# Patient Record
Sex: Female | Born: 1945
Health system: Southern US, Community
[De-identification: ages and names within clinical notes are randomized; demographics above are authoritative.]

## PROBLEM LIST (undated history)

## (undated) ENCOUNTER — Ambulatory Visit: Source: Home / Self Care

## (undated) DIAGNOSIS — E785 Hyperlipidemia, unspecified: Secondary | ICD-10-CM

## (undated) DIAGNOSIS — M1990 Unspecified osteoarthritis, unspecified site: Secondary | ICD-10-CM

## (undated) DIAGNOSIS — K2970 Gastritis, unspecified, without bleeding: Secondary | ICD-10-CM

## (undated) DIAGNOSIS — K219 Gastro-esophageal reflux disease without esophagitis: Secondary | ICD-10-CM

## (undated) DIAGNOSIS — K859 Acute pancreatitis without necrosis or infection, unspecified: Secondary | ICD-10-CM

## (undated) DIAGNOSIS — I10 Essential (primary) hypertension: Secondary | ICD-10-CM

## (undated) HISTORY — DX: Hyperlipidemia, unspecified: E78.5

## (undated) HISTORY — PX: CATARACT EXTRACTION, BILATERAL: SHX1313

## (undated) HISTORY — PX: OTHER SURGICAL HISTORY: SHX169

## (undated) HISTORY — DX: Essential (primary) hypertension: I10

## (undated) HISTORY — DX: Gastritis, unspecified, without bleeding: K29.70

## (undated) HISTORY — PX: RHINOPLASTY: SUR1284

## (undated) HISTORY — DX: Acute pancreatitis without necrosis or infection, unspecified: K85.90

---

## 1998-01-17 ENCOUNTER — Other Ambulatory Visit: Admission: RE | Admit: 1998-01-17 | Discharge: 1998-01-17 | Payer: Self-pay | Admitting: Obstetrics and Gynecology

## 1998-06-25 HISTORY — PX: BREAST BIOPSY: SHX20

## 1999-02-13 ENCOUNTER — Other Ambulatory Visit: Admission: RE | Admit: 1999-02-13 | Discharge: 1999-02-13 | Payer: Self-pay | Admitting: Obstetrics and Gynecology

## 1999-03-09 ENCOUNTER — Ambulatory Visit (HOSPITAL_COMMUNITY): Admission: RE | Admit: 1999-03-09 | Discharge: 1999-03-09 | Payer: Self-pay | Admitting: Surgery

## 1999-03-09 ENCOUNTER — Encounter (INDEPENDENT_AMBULATORY_CARE_PROVIDER_SITE_OTHER): Payer: Self-pay

## 1999-03-09 ENCOUNTER — Encounter: Payer: Self-pay | Admitting: Surgery

## 2000-01-23 ENCOUNTER — Other Ambulatory Visit: Admission: RE | Admit: 2000-01-23 | Discharge: 2000-01-23 | Payer: Self-pay | Admitting: Obstetrics and Gynecology

## 2000-03-08 ENCOUNTER — Encounter: Payer: Self-pay | Admitting: Obstetrics and Gynecology

## 2000-03-08 ENCOUNTER — Ambulatory Visit (HOSPITAL_COMMUNITY): Admission: RE | Admit: 2000-03-08 | Discharge: 2000-03-08 | Payer: Self-pay | Admitting: Obstetrics and Gynecology

## 2001-03-07 ENCOUNTER — Ambulatory Visit (HOSPITAL_COMMUNITY): Admission: RE | Admit: 2001-03-07 | Discharge: 2001-03-07 | Payer: Self-pay | Admitting: Obstetrics and Gynecology

## 2001-03-07 ENCOUNTER — Encounter: Payer: Self-pay | Admitting: Obstetrics and Gynecology

## 2001-03-11 ENCOUNTER — Other Ambulatory Visit: Admission: RE | Admit: 2001-03-11 | Discharge: 2001-03-11 | Payer: Self-pay | Admitting: Obstetrics and Gynecology

## 2002-03-10 ENCOUNTER — Ambulatory Visit (HOSPITAL_COMMUNITY): Admission: RE | Admit: 2002-03-10 | Discharge: 2002-03-10 | Payer: Self-pay | Admitting: Obstetrics and Gynecology

## 2002-03-10 ENCOUNTER — Encounter: Payer: Self-pay | Admitting: Obstetrics and Gynecology

## 2002-03-18 ENCOUNTER — Other Ambulatory Visit: Admission: RE | Admit: 2002-03-18 | Discharge: 2002-03-18 | Payer: Self-pay | Admitting: Obstetrics and Gynecology

## 2003-01-26 ENCOUNTER — Encounter: Admission: RE | Admit: 2003-01-26 | Discharge: 2003-01-26 | Payer: Self-pay | Admitting: Obstetrics and Gynecology

## 2003-01-26 ENCOUNTER — Encounter: Payer: Self-pay | Admitting: Obstetrics and Gynecology

## 2003-04-22 ENCOUNTER — Other Ambulatory Visit: Admission: RE | Admit: 2003-04-22 | Discharge: 2003-04-22 | Payer: Self-pay | Admitting: Obstetrics and Gynecology

## 2004-01-27 ENCOUNTER — Encounter: Admission: RE | Admit: 2004-01-27 | Discharge: 2004-01-27 | Payer: Self-pay | Admitting: Obstetrics and Gynecology

## 2004-04-25 ENCOUNTER — Other Ambulatory Visit: Admission: RE | Admit: 2004-04-25 | Discharge: 2004-04-25 | Payer: Self-pay | Admitting: Obstetrics and Gynecology

## 2005-01-31 ENCOUNTER — Encounter: Admission: RE | Admit: 2005-01-31 | Discharge: 2005-01-31 | Payer: Self-pay | Admitting: Obstetrics and Gynecology

## 2005-05-15 ENCOUNTER — Encounter: Admission: RE | Admit: 2005-05-15 | Discharge: 2005-05-15 | Payer: Self-pay | Admitting: Obstetrics and Gynecology

## 2005-07-25 ENCOUNTER — Encounter: Admission: RE | Admit: 2005-07-25 | Discharge: 2005-07-25 | Payer: Self-pay | Admitting: Orthopedic Surgery

## 2006-03-14 ENCOUNTER — Encounter: Admission: RE | Admit: 2006-03-14 | Discharge: 2006-03-14 | Payer: Self-pay | Admitting: Obstetrics and Gynecology

## 2007-05-16 ENCOUNTER — Encounter: Admission: RE | Admit: 2007-05-16 | Discharge: 2007-05-16 | Payer: Self-pay | Admitting: Obstetrics and Gynecology

## 2008-05-17 ENCOUNTER — Encounter: Admission: RE | Admit: 2008-05-17 | Discharge: 2008-05-17 | Payer: Self-pay | Admitting: Obstetrics and Gynecology

## 2009-07-12 ENCOUNTER — Encounter: Admission: RE | Admit: 2009-07-12 | Discharge: 2009-07-12 | Payer: Self-pay | Admitting: Obstetrics and Gynecology

## 2010-07-14 ENCOUNTER — Encounter
Admission: RE | Admit: 2010-07-14 | Discharge: 2010-07-14 | Payer: Self-pay | Source: Home / Self Care | Attending: Obstetrics and Gynecology | Admitting: Obstetrics and Gynecology

## 2011-06-29 ENCOUNTER — Other Ambulatory Visit: Payer: Self-pay | Admitting: Obstetrics and Gynecology

## 2011-06-29 DIAGNOSIS — Z1231 Encounter for screening mammogram for malignant neoplasm of breast: Secondary | ICD-10-CM

## 2011-07-17 ENCOUNTER — Ambulatory Visit
Admission: RE | Admit: 2011-07-17 | Discharge: 2011-07-17 | Disposition: A | Discharge status: Home / Self Care | Payer: BLUE CROSS/BLUE SHIELD | Source: Ambulatory Visit | Attending: Obstetrics and Gynecology | Admitting: Obstetrics and Gynecology

## 2011-07-17 DIAGNOSIS — Z1231 Encounter for screening mammogram for malignant neoplasm of breast: Secondary | ICD-10-CM

## 2012-02-11 DIAGNOSIS — E785 Hyperlipidemia, unspecified: Secondary | ICD-10-CM | POA: Insufficient documentation

## 2012-04-01 DIAGNOSIS — M8000XD Age-related osteoporosis with current pathological fracture, unspecified site, subsequent encounter for fracture with routine healing: Secondary | ICD-10-CM | POA: Insufficient documentation

## 2012-04-01 DIAGNOSIS — M810 Age-related osteoporosis without current pathological fracture: Secondary | ICD-10-CM | POA: Insufficient documentation

## 2012-04-15 ENCOUNTER — Encounter (HOSPITAL_COMMUNITY): Payer: Self-pay

## 2012-04-15 ENCOUNTER — Encounter (HOSPITAL_COMMUNITY)
Admission: RE | Admit: 2012-04-15 | Discharge: 2012-04-15 | Disposition: A | Discharge status: Home / Self Care | Payer: Medicare Other | Source: Ambulatory Visit | Attending: Internal Medicine | Admitting: Internal Medicine

## 2012-04-15 DIAGNOSIS — M810 Age-related osteoporosis without current pathological fracture: Secondary | ICD-10-CM | POA: Insufficient documentation

## 2012-04-15 HISTORY — DX: Gastro-esophageal reflux disease without esophagitis: K21.9

## 2012-04-15 HISTORY — DX: Unspecified osteoarthritis, unspecified site: M19.90

## 2012-04-15 MED ORDER — ZOLEDRONIC ACID 5 MG/100ML IV SOLN
5.0000 mg | Freq: Once | INTRAVENOUS | Status: AC
  Administered 2012-04-15: 5 mg via INTRAVENOUS
  Filled 2012-04-15: qty 100

## 2012-04-15 MED ORDER — SODIUM CHLORIDE 0.9 % IV SOLN
Freq: Once | INTRAVENOUS | Status: AC
  Administered 2012-04-15: 15:00:00 via INTRAVENOUS

## 2012-04-15 NOTE — Progress Notes (Signed)
Pt here for reclast infusion . States she"cannot take oral calcium pills because it caused calcium deposits in breast but she is able to take milk, yogurt,cheese and almonds" Calcium blood level is 9.5

## 2012-06-11 ENCOUNTER — Other Ambulatory Visit: Payer: Self-pay | Admitting: Obstetrics and Gynecology

## 2012-06-11 DIAGNOSIS — Z1231 Encounter for screening mammogram for malignant neoplasm of breast: Secondary | ICD-10-CM

## 2012-07-18 ENCOUNTER — Ambulatory Visit
Admission: RE | Admit: 2012-07-18 | Discharge: 2012-07-18 | Disposition: A | Discharge status: Home / Self Care | Payer: Medicare Other | Source: Ambulatory Visit | Attending: Obstetrics and Gynecology | Admitting: Obstetrics and Gynecology

## 2012-07-18 DIAGNOSIS — Z1231 Encounter for screening mammogram for malignant neoplasm of breast: Secondary | ICD-10-CM

## 2012-07-21 ENCOUNTER — Other Ambulatory Visit: Payer: Self-pay | Admitting: Obstetrics and Gynecology

## 2012-07-21 DIAGNOSIS — R928 Other abnormal and inconclusive findings on diagnostic imaging of breast: Secondary | ICD-10-CM

## 2012-07-30 ENCOUNTER — Ambulatory Visit
Admission: RE | Admit: 2012-07-30 | Discharge: 2012-07-30 | Disposition: A | Discharge status: Home / Self Care | Payer: Medicare Other | Source: Ambulatory Visit | Attending: Obstetrics and Gynecology | Admitting: Obstetrics and Gynecology

## 2012-07-30 DIAGNOSIS — R928 Other abnormal and inconclusive findings on diagnostic imaging of breast: Secondary | ICD-10-CM

## 2013-03-25 DIAGNOSIS — R32 Unspecified urinary incontinence: Secondary | ICD-10-CM | POA: Insufficient documentation

## 2013-03-25 DIAGNOSIS — R945 Abnormal results of liver function studies: Secondary | ICD-10-CM | POA: Insufficient documentation

## 2013-04-16 ENCOUNTER — Encounter (HOSPITAL_COMMUNITY)
Admission: RE | Admit: 2013-04-16 | Discharge: 2013-04-16 | Disposition: A | Discharge status: Home / Self Care | Payer: Medicare Other | Source: Ambulatory Visit | Attending: Internal Medicine | Admitting: Internal Medicine

## 2013-04-16 DIAGNOSIS — M810 Age-related osteoporosis without current pathological fracture: Secondary | ICD-10-CM | POA: Insufficient documentation

## 2013-04-16 MED ORDER — SODIUM CHLORIDE 0.9 % IV SOLN
INTRAVENOUS | Status: DC
  Administered 2013-04-16: 250 mL via INTRAVENOUS

## 2013-04-16 MED ORDER — ZOLEDRONIC ACID 5 MG/100ML IV SOLN
5.0000 mg | Freq: Once | INTRAVENOUS | Status: AC
  Administered 2013-04-16: 5 mg via INTRAVENOUS
  Filled 2013-04-16: qty 100

## 2013-04-16 NOTE — Progress Notes (Signed)
Pt here for RECLAST infusion and lab work is within criteria for infusion. Pt states that she does not take calcium supplements but does take dietary calcium. Any further questions about this she will discuss with Dr Waynard Edwards

## 2013-07-03 ENCOUNTER — Other Ambulatory Visit: Payer: Self-pay

## 2013-07-03 DIAGNOSIS — Z1231 Encounter for screening mammogram for malignant neoplasm of breast: Secondary | ICD-10-CM

## 2013-07-27 ENCOUNTER — Ambulatory Visit
Admission: RE | Admit: 2013-07-27 | Discharge: 2013-07-27 | Disposition: A | Discharge status: Home / Self Care | Payer: Medicare Other | Source: Ambulatory Visit

## 2013-07-27 DIAGNOSIS — Z1231 Encounter for screening mammogram for malignant neoplasm of breast: Secondary | ICD-10-CM

## 2014-04-16 DIAGNOSIS — M4120 Other idiopathic scoliosis, site unspecified: Secondary | ICD-10-CM | POA: Insufficient documentation

## 2014-04-28 ENCOUNTER — Other Ambulatory Visit (HOSPITAL_COMMUNITY): Payer: Self-pay | Admitting: Internal Medicine

## 2014-05-05 ENCOUNTER — Encounter (HOSPITAL_COMMUNITY): Payer: Self-pay

## 2014-05-05 ENCOUNTER — Ambulatory Visit (HOSPITAL_COMMUNITY)
Admission: RE | Admit: 2014-05-05 | Discharge: 2014-05-05 | Disposition: A | Discharge status: Home / Self Care | Payer: Medicare Other | Source: Ambulatory Visit | Attending: Internal Medicine | Admitting: Internal Medicine

## 2014-05-05 ENCOUNTER — Other Ambulatory Visit (HOSPITAL_COMMUNITY): Payer: Self-pay | Admitting: Internal Medicine

## 2014-05-05 DIAGNOSIS — M810 Age-related osteoporosis without current pathological fracture: Secondary | ICD-10-CM | POA: Insufficient documentation

## 2014-05-05 MED ORDER — SODIUM CHLORIDE 0.9 % IV SOLN
INTRAVENOUS | Status: DC
  Administered 2014-05-05: 14:00:00 via INTRAVENOUS

## 2014-05-05 MED ORDER — ZOLEDRONIC ACID 5 MG/100ML IV SOLN
5.0000 mg | Freq: Once | INTRAVENOUS | Status: AC
  Administered 2014-05-05: 5 mg via INTRAVENOUS
  Filled 2014-05-05: qty 100

## 2014-05-05 NOTE — Discharge Instructions (Signed)

## 2014-07-02 ENCOUNTER — Other Ambulatory Visit: Payer: Self-pay

## 2014-07-02 DIAGNOSIS — Z1231 Encounter for screening mammogram for malignant neoplasm of breast: Secondary | ICD-10-CM

## 2014-08-03 ENCOUNTER — Ambulatory Visit
Admission: RE | Admit: 2014-08-03 | Discharge: 2014-08-03 | Disposition: A | Discharge status: Home / Self Care | Payer: Medicare Other | Source: Ambulatory Visit

## 2014-08-03 DIAGNOSIS — Z1231 Encounter for screening mammogram for malignant neoplasm of breast: Secondary | ICD-10-CM

## 2015-03-21 DIAGNOSIS — I8310 Varicose veins of unspecified lower extremity with inflammation: Secondary | ICD-10-CM | POA: Insufficient documentation

## 2015-04-15 DIAGNOSIS — Z0000 Encounter for general adult medical examination without abnormal findings: Secondary | ICD-10-CM | POA: Insufficient documentation

## 2015-04-28 DIAGNOSIS — Z1389 Encounter for screening for other disorder: Secondary | ICD-10-CM | POA: Insufficient documentation

## 2015-05-11 ENCOUNTER — Ambulatory Visit (HOSPITAL_COMMUNITY)
Admission: RE | Admit: 2015-05-11 | Discharge: 2015-05-11 | Disposition: A | Discharge status: Home / Self Care | Payer: Medicare Other | Source: Ambulatory Visit | Attending: Internal Medicine | Admitting: Internal Medicine

## 2015-05-11 ENCOUNTER — Other Ambulatory Visit (HOSPITAL_COMMUNITY): Payer: Self-pay | Admitting: Internal Medicine

## 2015-05-11 ENCOUNTER — Encounter (HOSPITAL_COMMUNITY): Payer: Self-pay

## 2015-05-11 DIAGNOSIS — M810 Age-related osteoporosis without current pathological fracture: Secondary | ICD-10-CM | POA: Diagnosis present

## 2015-05-11 DIAGNOSIS — N959 Unspecified menopausal and perimenopausal disorder: Secondary | ICD-10-CM | POA: Insufficient documentation

## 2015-05-11 MED ORDER — ZOLEDRONIC ACID 5 MG/100ML IV SOLN
5.0000 mg | Freq: Once | INTRAVENOUS | Status: AC
  Administered 2015-05-11: 5 mg via INTRAVENOUS
  Filled 2015-05-11: qty 100

## 2015-05-11 MED ORDER — SODIUM CHLORIDE 0.9 % IV SOLN
INTRAVENOUS | Status: DC
  Administered 2015-05-11: 16:00:00 via INTRAVENOUS

## 2015-05-11 NOTE — Discharge Instructions (Signed)

## 2015-07-07 ENCOUNTER — Other Ambulatory Visit: Payer: Self-pay

## 2015-07-07 DIAGNOSIS — Z1231 Encounter for screening mammogram for malignant neoplasm of breast: Secondary | ICD-10-CM

## 2015-07-27 DIAGNOSIS — M791 Myalgia: Secondary | ICD-10-CM | POA: Diagnosis not present

## 2015-07-27 DIAGNOSIS — R3129 Other microscopic hematuria: Secondary | ICD-10-CM | POA: Diagnosis not present

## 2015-07-27 DIAGNOSIS — Z1211 Encounter for screening for malignant neoplasm of colon: Secondary | ICD-10-CM | POA: Diagnosis not present

## 2015-07-27 DIAGNOSIS — R1032 Left lower quadrant pain: Secondary | ICD-10-CM | POA: Diagnosis not present

## 2015-07-27 DIAGNOSIS — Z6822 Body mass index (BMI) 22.0-22.9, adult: Secondary | ICD-10-CM | POA: Diagnosis not present

## 2015-07-27 DIAGNOSIS — R030 Elevated blood-pressure reading, without diagnosis of hypertension: Secondary | ICD-10-CM | POA: Diagnosis not present

## 2015-08-01 ENCOUNTER — Encounter: Payer: Self-pay | Admitting: Internal Medicine

## 2015-08-04 DIAGNOSIS — R1013 Epigastric pain: Secondary | ICD-10-CM | POA: Diagnosis not present

## 2015-08-04 DIAGNOSIS — L270 Generalized skin eruption due to drugs and medicaments taken internally: Secondary | ICD-10-CM | POA: Diagnosis not present

## 2015-08-04 DIAGNOSIS — R1032 Left lower quadrant pain: Secondary | ICD-10-CM | POA: Diagnosis not present

## 2015-08-04 DIAGNOSIS — R945 Abnormal results of liver function studies: Secondary | ICD-10-CM | POA: Diagnosis not present

## 2015-08-04 DIAGNOSIS — M2550 Pain in unspecified joint: Secondary | ICD-10-CM | POA: Diagnosis not present

## 2015-08-04 DIAGNOSIS — Z6822 Body mass index (BMI) 22.0-22.9, adult: Secondary | ICD-10-CM | POA: Diagnosis not present

## 2015-08-04 DIAGNOSIS — M791 Myalgia: Secondary | ICD-10-CM | POA: Diagnosis not present

## 2015-08-08 ENCOUNTER — Ambulatory Visit: Payer: Medicare Other

## 2015-08-08 ENCOUNTER — Ambulatory Visit
Admission: RE | Admit: 2015-08-08 | Discharge: 2015-08-08 | Disposition: A | Discharge status: Home / Self Care | Payer: Medicare Other | Source: Ambulatory Visit

## 2015-08-08 DIAGNOSIS — Z1231 Encounter for screening mammogram for malignant neoplasm of breast: Secondary | ICD-10-CM | POA: Diagnosis not present

## 2015-08-25 DIAGNOSIS — R768 Other specified abnormal immunological findings in serum: Secondary | ICD-10-CM | POA: Diagnosis not present

## 2015-08-25 DIAGNOSIS — M25562 Pain in left knee: Secondary | ICD-10-CM | POA: Diagnosis not present

## 2015-08-25 DIAGNOSIS — M2550 Pain in unspecified joint: Secondary | ICD-10-CM | POA: Diagnosis not present

## 2016-03-07 DIAGNOSIS — Z6822 Body mass index (BMI) 22.0-22.9, adult: Secondary | ICD-10-CM | POA: Diagnosis not present

## 2016-03-07 DIAGNOSIS — L299 Pruritus, unspecified: Secondary | ICD-10-CM | POA: Diagnosis not present

## 2016-03-07 DIAGNOSIS — W57XXXA Bitten or stung by nonvenomous insect and other nonvenomous arthropods, initial encounter: Secondary | ICD-10-CM | POA: Diagnosis not present

## 2016-03-22 DIAGNOSIS — Z85828 Personal history of other malignant neoplasm of skin: Secondary | ICD-10-CM | POA: Diagnosis not present

## 2016-03-22 DIAGNOSIS — L817 Pigmented purpuric dermatosis: Secondary | ICD-10-CM | POA: Diagnosis not present

## 2016-03-22 DIAGNOSIS — S30860A Insect bite (nonvenomous) of lower back and pelvis, initial encounter: Secondary | ICD-10-CM | POA: Diagnosis not present

## 2016-05-01 DIAGNOSIS — Z23 Encounter for immunization: Secondary | ICD-10-CM | POA: Diagnosis not present

## 2016-06-16 DIAGNOSIS — M26629 Arthralgia of temporomandibular joint, unspecified side: Secondary | ICD-10-CM | POA: Diagnosis not present

## 2016-06-16 DIAGNOSIS — R030 Elevated blood-pressure reading, without diagnosis of hypertension: Secondary | ICD-10-CM | POA: Diagnosis not present

## 2016-07-03 DIAGNOSIS — Z85828 Personal history of other malignant neoplasm of skin: Secondary | ICD-10-CM | POA: Diagnosis not present

## 2016-07-03 DIAGNOSIS — D1801 Hemangioma of skin and subcutaneous tissue: Secondary | ICD-10-CM | POA: Diagnosis not present

## 2016-07-03 DIAGNOSIS — L111 Transient acantholytic dermatosis [Grover]: Secondary | ICD-10-CM | POA: Diagnosis not present

## 2016-07-03 DIAGNOSIS — D225 Melanocytic nevi of trunk: Secondary | ICD-10-CM | POA: Diagnosis not present

## 2016-07-03 DIAGNOSIS — L821 Other seborrheic keratosis: Secondary | ICD-10-CM | POA: Diagnosis not present

## 2016-07-03 DIAGNOSIS — L2089 Other atopic dermatitis: Secondary | ICD-10-CM | POA: Diagnosis not present

## 2016-07-03 DIAGNOSIS — L817 Pigmented purpuric dermatosis: Secondary | ICD-10-CM | POA: Diagnosis not present

## 2016-07-03 DIAGNOSIS — L814 Other melanin hyperpigmentation: Secondary | ICD-10-CM | POA: Diagnosis not present

## 2016-07-05 ENCOUNTER — Other Ambulatory Visit: Payer: Self-pay | Admitting: Internal Medicine

## 2016-07-05 DIAGNOSIS — Z1231 Encounter for screening mammogram for malignant neoplasm of breast: Secondary | ICD-10-CM

## 2016-07-24 DIAGNOSIS — H524 Presbyopia: Secondary | ICD-10-CM | POA: Diagnosis not present

## 2016-08-15 DIAGNOSIS — M810 Age-related osteoporosis without current pathological fracture: Secondary | ICD-10-CM | POA: Diagnosis not present

## 2016-08-15 DIAGNOSIS — E784 Other hyperlipidemia: Secondary | ICD-10-CM | POA: Diagnosis not present

## 2016-08-17 ENCOUNTER — Ambulatory Visit
Admission: RE | Admit: 2016-08-17 | Discharge: 2016-08-17 | Disposition: A | Discharge status: Home / Self Care | Payer: Medicare Other | Source: Ambulatory Visit | Attending: Internal Medicine | Admitting: Internal Medicine

## 2016-08-17 DIAGNOSIS — Z1231 Encounter for screening mammogram for malignant neoplasm of breast: Secondary | ICD-10-CM | POA: Diagnosis not present

## 2016-08-21 ENCOUNTER — Other Ambulatory Visit: Payer: Self-pay | Admitting: Internal Medicine

## 2016-08-21 DIAGNOSIS — R928 Other abnormal and inconclusive findings on diagnostic imaging of breast: Secondary | ICD-10-CM

## 2016-08-22 DIAGNOSIS — R030 Elevated blood-pressure reading, without diagnosis of hypertension: Secondary | ICD-10-CM | POA: Diagnosis not present

## 2016-08-22 DIAGNOSIS — M810 Age-related osteoporosis without current pathological fracture: Secondary | ICD-10-CM | POA: Diagnosis not present

## 2016-08-22 DIAGNOSIS — Z0000 Encounter for general adult medical examination without abnormal findings: Secondary | ICD-10-CM | POA: Diagnosis not present

## 2016-08-22 DIAGNOSIS — Z1212 Encounter for screening for malignant neoplasm of rectum: Secondary | ICD-10-CM | POA: Diagnosis not present

## 2016-08-22 DIAGNOSIS — Z23 Encounter for immunization: Secondary | ICD-10-CM | POA: Diagnosis not present

## 2016-08-22 DIAGNOSIS — I8310 Varicose veins of unspecified lower extremity with inflammation: Secondary | ICD-10-CM | POA: Diagnosis not present

## 2016-08-24 ENCOUNTER — Ambulatory Visit
Admission: RE | Admit: 2016-08-24 | Discharge: 2016-08-24 | Disposition: A | Discharge status: Home / Self Care | Payer: Medicare Other | Source: Ambulatory Visit | Attending: Internal Medicine | Admitting: Internal Medicine

## 2016-08-24 DIAGNOSIS — R928 Other abnormal and inconclusive findings on diagnostic imaging of breast: Secondary | ICD-10-CM

## 2016-09-05 DIAGNOSIS — Z1211 Encounter for screening for malignant neoplasm of colon: Secondary | ICD-10-CM | POA: Diagnosis not present

## 2016-09-05 DIAGNOSIS — Z1212 Encounter for screening for malignant neoplasm of rectum: Secondary | ICD-10-CM | POA: Diagnosis not present

## 2016-10-03 DIAGNOSIS — R945 Abnormal results of liver function studies: Secondary | ICD-10-CM | POA: Diagnosis not present

## 2016-12-20 DIAGNOSIS — R945 Abnormal results of liver function studies: Secondary | ICD-10-CM | POA: Diagnosis not present

## 2017-01-21 ENCOUNTER — Other Ambulatory Visit: Payer: Self-pay | Admitting: Internal Medicine

## 2017-01-21 DIAGNOSIS — R921 Mammographic calcification found on diagnostic imaging of breast: Secondary | ICD-10-CM

## 2017-03-01 ENCOUNTER — Ambulatory Visit
Admission: RE | Admit: 2017-03-01 | Discharge: 2017-03-01 | Disposition: A | Discharge status: Home / Self Care | Payer: Medicare Other | Source: Ambulatory Visit | Attending: Internal Medicine | Admitting: Internal Medicine

## 2017-03-01 DIAGNOSIS — R928 Other abnormal and inconclusive findings on diagnostic imaging of breast: Secondary | ICD-10-CM | POA: Diagnosis not present

## 2017-03-01 DIAGNOSIS — R921 Mammographic calcification found on diagnostic imaging of breast: Secondary | ICD-10-CM

## 2017-03-04 DIAGNOSIS — M810 Age-related osteoporosis without current pathological fracture: Secondary | ICD-10-CM | POA: Diagnosis not present

## 2017-03-05 ENCOUNTER — Other Ambulatory Visit: Payer: Self-pay | Admitting: Internal Medicine

## 2017-03-05 DIAGNOSIS — R921 Mammographic calcification found on diagnostic imaging of breast: Secondary | ICD-10-CM

## 2017-03-12 DIAGNOSIS — R945 Abnormal results of liver function studies: Secondary | ICD-10-CM | POA: Diagnosis not present

## 2017-04-02 DIAGNOSIS — Z6821 Body mass index (BMI) 21.0-21.9, adult: Secondary | ICD-10-CM | POA: Diagnosis not present

## 2017-04-02 DIAGNOSIS — S80921A Unspecified superficial injury of right lower leg, initial encounter: Secondary | ICD-10-CM | POA: Diagnosis not present

## 2017-04-02 DIAGNOSIS — L0889 Other specified local infections of the skin and subcutaneous tissue: Secondary | ICD-10-CM | POA: Diagnosis not present

## 2017-04-25 DIAGNOSIS — Z23 Encounter for immunization: Secondary | ICD-10-CM | POA: Diagnosis not present

## 2017-07-01 DIAGNOSIS — L814 Other melanin hyperpigmentation: Secondary | ICD-10-CM | POA: Diagnosis not present

## 2017-07-01 DIAGNOSIS — L821 Other seborrheic keratosis: Secondary | ICD-10-CM | POA: Diagnosis not present

## 2017-07-01 DIAGNOSIS — L570 Actinic keratosis: Secondary | ICD-10-CM | POA: Diagnosis not present

## 2017-07-01 DIAGNOSIS — Z85828 Personal history of other malignant neoplasm of skin: Secondary | ICD-10-CM | POA: Diagnosis not present

## 2017-07-01 DIAGNOSIS — C44519 Basal cell carcinoma of skin of other part of trunk: Secondary | ICD-10-CM | POA: Diagnosis not present

## 2017-07-29 DIAGNOSIS — H524 Presbyopia: Secondary | ICD-10-CM | POA: Diagnosis not present

## 2017-08-19 ENCOUNTER — Ambulatory Visit
Admission: RE | Admit: 2017-08-19 | Discharge: 2017-08-19 | Disposition: A | Discharge status: Home / Self Care | Payer: Medicare Other | Source: Ambulatory Visit | Attending: Internal Medicine | Admitting: Internal Medicine

## 2017-08-19 DIAGNOSIS — R921 Mammographic calcification found on diagnostic imaging of breast: Secondary | ICD-10-CM

## 2017-08-19 DIAGNOSIS — M545 Low back pain: Secondary | ICD-10-CM | POA: Diagnosis not present

## 2017-08-19 DIAGNOSIS — M25551 Pain in right hip: Secondary | ICD-10-CM | POA: Diagnosis not present

## 2017-08-19 DIAGNOSIS — M25552 Pain in left hip: Secondary | ICD-10-CM | POA: Diagnosis not present

## 2017-09-16 DIAGNOSIS — M25551 Pain in right hip: Secondary | ICD-10-CM | POA: Diagnosis not present

## 2017-09-16 DIAGNOSIS — M25552 Pain in left hip: Secondary | ICD-10-CM | POA: Diagnosis not present

## 2017-10-02 DIAGNOSIS — I10 Essential (primary) hypertension: Secondary | ICD-10-CM | POA: Diagnosis not present

## 2017-10-02 DIAGNOSIS — R82998 Other abnormal findings in urine: Secondary | ICD-10-CM | POA: Diagnosis not present

## 2017-10-02 DIAGNOSIS — M810 Age-related osteoporosis without current pathological fracture: Secondary | ICD-10-CM | POA: Diagnosis not present

## 2017-10-02 DIAGNOSIS — E785 Hyperlipidemia, unspecified: Secondary | ICD-10-CM | POA: Diagnosis not present

## 2017-10-07 DIAGNOSIS — R921 Mammographic calcification found on diagnostic imaging of breast: Secondary | ICD-10-CM | POA: Diagnosis not present

## 2017-10-07 DIAGNOSIS — L0889 Other specified local infections of the skin and subcutaneous tissue: Secondary | ICD-10-CM | POA: Diagnosis not present

## 2017-10-07 DIAGNOSIS — Z0000 Encounter for general adult medical examination without abnormal findings: Secondary | ICD-10-CM | POA: Diagnosis not present

## 2017-10-07 DIAGNOSIS — M7150 Other bursitis, not elsewhere classified, unspecified site: Secondary | ICD-10-CM | POA: Diagnosis not present

## 2018-01-02 DIAGNOSIS — L821 Other seborrheic keratosis: Secondary | ICD-10-CM | POA: Diagnosis not present

## 2018-01-02 DIAGNOSIS — L910 Hypertrophic scar: Secondary | ICD-10-CM | POA: Diagnosis not present

## 2018-01-02 DIAGNOSIS — Z85828 Personal history of other malignant neoplasm of skin: Secondary | ICD-10-CM | POA: Diagnosis not present

## 2018-01-02 DIAGNOSIS — L820 Inflamed seborrheic keratosis: Secondary | ICD-10-CM | POA: Diagnosis not present

## 2018-01-02 DIAGNOSIS — D225 Melanocytic nevi of trunk: Secondary | ICD-10-CM | POA: Diagnosis not present

## 2018-01-02 DIAGNOSIS — L570 Actinic keratosis: Secondary | ICD-10-CM | POA: Diagnosis not present

## 2018-01-13 DIAGNOSIS — Z6823 Body mass index (BMI) 23.0-23.9, adult: Secondary | ICD-10-CM | POA: Diagnosis not present

## 2018-01-13 DIAGNOSIS — L0889 Other specified local infections of the skin and subcutaneous tissue: Secondary | ICD-10-CM | POA: Diagnosis not present

## 2018-01-13 DIAGNOSIS — I10 Essential (primary) hypertension: Secondary | ICD-10-CM | POA: Diagnosis not present

## 2018-03-29 DIAGNOSIS — Z23 Encounter for immunization: Secondary | ICD-10-CM | POA: Diagnosis not present

## 2018-06-16 ENCOUNTER — Other Ambulatory Visit: Payer: Self-pay | Admitting: Internal Medicine

## 2018-06-16 DIAGNOSIS — K219 Gastro-esophageal reflux disease without esophagitis: Secondary | ICD-10-CM | POA: Diagnosis not present

## 2018-06-16 DIAGNOSIS — Z6823 Body mass index (BMI) 23.0-23.9, adult: Secondary | ICD-10-CM | POA: Diagnosis not present

## 2018-06-16 DIAGNOSIS — R1013 Epigastric pain: Secondary | ICD-10-CM | POA: Diagnosis not present

## 2018-06-20 DIAGNOSIS — R1011 Right upper quadrant pain: Secondary | ICD-10-CM | POA: Diagnosis not present

## 2018-06-20 DIAGNOSIS — K859 Acute pancreatitis without necrosis or infection, unspecified: Secondary | ICD-10-CM | POA: Insufficient documentation

## 2018-07-03 DIAGNOSIS — Z6823 Body mass index (BMI) 23.0-23.9, adult: Secondary | ICD-10-CM | POA: Diagnosis not present

## 2018-07-03 DIAGNOSIS — R1013 Epigastric pain: Secondary | ICD-10-CM | POA: Diagnosis not present

## 2018-07-03 DIAGNOSIS — K859 Acute pancreatitis without necrosis or infection, unspecified: Secondary | ICD-10-CM | POA: Diagnosis not present

## 2018-07-07 DIAGNOSIS — Z6823 Body mass index (BMI) 23.0-23.9, adult: Secondary | ICD-10-CM | POA: Diagnosis not present

## 2018-07-07 DIAGNOSIS — K219 Gastro-esophageal reflux disease without esophagitis: Secondary | ICD-10-CM | POA: Diagnosis not present

## 2018-07-07 DIAGNOSIS — K859 Acute pancreatitis without necrosis or infection, unspecified: Secondary | ICD-10-CM | POA: Diagnosis not present

## 2018-07-07 DIAGNOSIS — K921 Melena: Secondary | ICD-10-CM | POA: Diagnosis not present

## 2018-07-11 ENCOUNTER — Other Ambulatory Visit: Payer: Self-pay | Admitting: Internal Medicine

## 2018-07-11 DIAGNOSIS — K921 Melena: Secondary | ICD-10-CM | POA: Diagnosis not present

## 2018-07-11 DIAGNOSIS — R1013 Epigastric pain: Secondary | ICD-10-CM | POA: Diagnosis not present

## 2018-07-11 DIAGNOSIS — Z1231 Encounter for screening mammogram for malignant neoplasm of breast: Secondary | ICD-10-CM

## 2018-07-15 DIAGNOSIS — R1013 Epigastric pain: Secondary | ICD-10-CM | POA: Diagnosis not present

## 2018-07-15 DIAGNOSIS — K2930 Chronic superficial gastritis without bleeding: Secondary | ICD-10-CM | POA: Diagnosis not present

## 2018-07-15 DIAGNOSIS — K921 Melena: Secondary | ICD-10-CM | POA: Diagnosis not present

## 2018-07-23 DIAGNOSIS — K2930 Chronic superficial gastritis without bleeding: Secondary | ICD-10-CM | POA: Diagnosis not present

## 2018-07-23 DIAGNOSIS — K921 Melena: Secondary | ICD-10-CM | POA: Diagnosis not present

## 2018-07-30 DIAGNOSIS — R1013 Epigastric pain: Secondary | ICD-10-CM | POA: Diagnosis not present

## 2018-07-30 DIAGNOSIS — R945 Abnormal results of liver function studies: Secondary | ICD-10-CM | POA: Diagnosis not present

## 2018-07-30 DIAGNOSIS — K2950 Unspecified chronic gastritis without bleeding: Secondary | ICD-10-CM | POA: Diagnosis not present

## 2018-08-11 DIAGNOSIS — H524 Presbyopia: Secondary | ICD-10-CM | POA: Diagnosis not present

## 2018-08-26 ENCOUNTER — Ambulatory Visit
Admission: RE | Admit: 2018-08-26 | Discharge: 2018-08-26 | Disposition: A | Discharge status: Home / Self Care | Payer: Medicare Other | Source: Ambulatory Visit | Attending: Internal Medicine | Admitting: Internal Medicine

## 2018-08-26 DIAGNOSIS — Z1231 Encounter for screening mammogram for malignant neoplasm of breast: Secondary | ICD-10-CM

## 2018-08-27 DIAGNOSIS — Z1211 Encounter for screening for malignant neoplasm of colon: Secondary | ICD-10-CM | POA: Diagnosis not present

## 2018-08-27 DIAGNOSIS — K2950 Unspecified chronic gastritis without bleeding: Secondary | ICD-10-CM | POA: Diagnosis not present

## 2018-08-27 DIAGNOSIS — R945 Abnormal results of liver function studies: Secondary | ICD-10-CM | POA: Diagnosis not present

## 2018-08-27 DIAGNOSIS — K219 Gastro-esophageal reflux disease without esophagitis: Secondary | ICD-10-CM | POA: Diagnosis not present

## 2018-10-30 ENCOUNTER — Other Ambulatory Visit: Payer: Self-pay | Admitting: Gastroenterology

## 2018-10-30 DIAGNOSIS — R945 Abnormal results of liver function studies: Secondary | ICD-10-CM

## 2018-10-30 DIAGNOSIS — R1013 Epigastric pain: Secondary | ICD-10-CM

## 2018-10-30 DIAGNOSIS — K219 Gastro-esophageal reflux disease without esophagitis: Secondary | ICD-10-CM | POA: Diagnosis not present

## 2018-10-30 DIAGNOSIS — K2950 Unspecified chronic gastritis without bleeding: Secondary | ICD-10-CM

## 2018-10-30 DIAGNOSIS — R7989 Other specified abnormal findings of blood chemistry: Secondary | ICD-10-CM

## 2018-11-03 DIAGNOSIS — I10 Essential (primary) hypertension: Secondary | ICD-10-CM | POA: Diagnosis not present

## 2018-11-03 DIAGNOSIS — M810 Age-related osteoporosis without current pathological fracture: Secondary | ICD-10-CM | POA: Diagnosis not present

## 2018-11-03 DIAGNOSIS — E7849 Other hyperlipidemia: Secondary | ICD-10-CM | POA: Diagnosis not present

## 2018-11-06 DIAGNOSIS — I10 Essential (primary) hypertension: Secondary | ICD-10-CM | POA: Diagnosis not present

## 2018-11-06 DIAGNOSIS — R82998 Other abnormal findings in urine: Secondary | ICD-10-CM | POA: Diagnosis not present

## 2018-11-10 DIAGNOSIS — R921 Mammographic calcification found on diagnostic imaging of breast: Secondary | ICD-10-CM | POA: Diagnosis not present

## 2018-11-10 DIAGNOSIS — R1013 Epigastric pain: Secondary | ICD-10-CM | POA: Diagnosis not present

## 2018-11-10 DIAGNOSIS — Z0000 Encounter for general adult medical examination without abnormal findings: Secondary | ICD-10-CM | POA: Diagnosis not present

## 2018-11-10 DIAGNOSIS — K219 Gastro-esophageal reflux disease without esophagitis: Secondary | ICD-10-CM | POA: Diagnosis not present

## 2018-11-10 DIAGNOSIS — Z1331 Encounter for screening for depression: Secondary | ICD-10-CM | POA: Diagnosis not present

## 2018-11-13 ENCOUNTER — Other Ambulatory Visit: Payer: Self-pay

## 2018-11-13 ENCOUNTER — Ambulatory Visit
Admission: RE | Admit: 2018-11-13 | Discharge: 2018-11-13 | Disposition: A | Discharge status: Home / Self Care | Payer: Medicare Other | Source: Ambulatory Visit | Attending: Gastroenterology | Admitting: Gastroenterology

## 2018-11-13 DIAGNOSIS — K2950 Unspecified chronic gastritis without bleeding: Secondary | ICD-10-CM

## 2018-11-13 DIAGNOSIS — R1013 Epigastric pain: Secondary | ICD-10-CM | POA: Diagnosis not present

## 2018-11-13 DIAGNOSIS — R7989 Other specified abnormal findings of blood chemistry: Secondary | ICD-10-CM

## 2018-11-13 MED ORDER — IOPAMIDOL (ISOVUE-300) INJECTION 61%
100.0000 mL | Freq: Once | INTRAVENOUS | Status: AC | PRN
  Administered 2018-11-13: 100 mL via INTRAVENOUS

## 2018-11-18 NOTE — Progress Notes (Signed)
Patient referred by Crist Infante, MD for hyperlipidemia  Subjective:   Tina Erickson, female    DOB: Nov 18, 1945, 73 y.o.   MRN: [id number]   Chief Complaint  Patient presents with  . New Patient (Initial Visit)  . Hyperlipidemia  . Chest Pain    HPI  73 y.o. Caucasian female with controlled hypertension, hyperlipidemia, h/o pancreatitis, family h/o CAD.  Patient lives with her husband, for whom she is the primary caretaker. She plays [employer], where she uses cart. She walks up to a mile without any chest pain or shortness of breath. Few weeks ago, she had two episodes of chest pressure and shortness of breath while driving, lasted for 3-5 min, and resolved on their own. She has h/o stomach ulcers, and pancreatitis, but states that these episodes felt different than her stomach ulcers or pancreatitis pain. Recent CT abdomen showed possible gastritis. She reportedly also underwent EGD, results not available to me.   Patient has had hyperlipidemia for a long time. In the past, she has tried pravastatin (casued memory issues), Livalo (caused itching), Zetia (casued joint pain), and Praluent (breating issues). She is currently on no lipid lowering therapy.   On a separate note, she also complains of tingling, numbness, and cold feeling in her feet.  Past Medical History:  Diagnosis Date  . Acid reflux    just occasionally  . Arthritis    at the base of my spine     Past Surgical History:  Procedure Laterality Date  . BREAST BIOPSY Left 2000  . fractured tibia repair    . RHINOPLASTY       Social History   Socioeconomic History  . Marital status: Married    Spouse name: Not on file  . Number of children: Not on file  . Years of education: Not on file  . Highest education level: Not on file  Occupational History  . Not on file  Social Needs  . Financial resource strain: Not on file  . Food insecurity:    Worry: Not on file    Inability: Not on file  .  Transportation needs:    Medical: Not on file    Non-medical: Not on file  Tobacco Use  . Smoking status: Never Smoker  Substance and Sexual Activity  . Alcohol use: Yes    Alcohol/week: 7.0 standard drinks    Types: 7 drink(s) per week  . Drug use: No  . Sexual activity: Not on file  Lifestyle  . Physical activity:    Days per week: Not on file    Minutes per session: Not on file  . Stress: Not on file  Relationships  . Social connections:    Talks on phone: Not on file    Gets together: Not on file    Attends religious service: Not on file    Active member of club or organization: Not on file    Attends meetings of clubs or organizations: Not on file    Relationship status: Not on file  . Intimate partner violence:    Fear of current or ex partner: Not on file    Emotionally abused: Not on file    Physically abused: Not on file    Forced sexual activity: Not on file  Other Topics Concern  . Not on file  Social History Narrative  . Not on file     Family History  Problem Relation Age of Onset  . Breast cancer Sister  50     Current Outpatient Medications on File Prior to Visit  Medication Sig Dispense Refill  . aspirin 81 MG tablet Take 81 mg by mouth daily.    . cholecalciferol (VITAMIN D) 1000 UNITS tablet Take 1,000 Units by mouth daily.    Marland Kitchen co-enzyme Q-10 50 MG capsule Take 50 mg by mouth daily.    . Multiple Vitamins-Minerals (HAIR SKIN AND NAILS FORMULA) TABS Take by mouth daily.    Marland Kitchen omeprazole (PRILOSEC) 20 MG capsule Take 20 mg by mouth daily.    Marland Kitchen OVER THE COUNTER MEDICATION Take 1 capsule by mouth daily. MEGA RED     No current facility-administered medications on file prior to visit.     Cardiovascular studies:  EKG 11/19/2018: Sinus rhythm 66 bpm. Normal EKG.  CT abdomen 11/13/2018: 1. No acute findings or explanation for the patient's symptoms. 2. Limited evaluation of the stomach due to incomplete distention and absence of enteric  contrast. Possible gastric wall thickening as can be seen with gastritis. 3. Small renal cysts. 4.  Aortic Atherosclerosis (ICD10-I70.0).  Recent labs: 11/03/2018: Glucose 88. BUN/Cr 11/0.9. eGFR normal. Na/K 139/4.1. Rest of the CMP normal. H/H 11/36. MCV 94. Platelets 244. Chol 296, TG 114, HDL 64, LDL 209 Apolipoprotein B 126 (high) TSH normal  Review of Systems  Constitution: Negative for decreased appetite, malaise/fatigue, weight gain and weight loss.  HENT: Negative for congestion.   Eyes: Negative for visual disturbance.  Cardiovascular: Positive for chest pain. Negative for dyspnea on exertion, leg swelling, palpitations and syncope.  Respiratory: Positive for shortness of breath. Negative for cough.   Endocrine: Negative for cold intolerance.  Hematologic/Lymphatic: Does not bruise/bleed easily.  Skin: Negative for itching and rash.  Musculoskeletal: Negative for myalgias.  Gastrointestinal: Negative for abdominal pain, nausea and vomiting.  Genitourinary: Negative for dysuria.  Neurological: Positive for paresthesias. Negative for dizziness and weakness.  Psychiatric/Behavioral: The patient is not nervous/anxious.   All other systems reviewed and are negative.        Vitals:   11/19/18 1057  BP: 128/76  Pulse: 70  Temp: (!) 96.6 F (35.9 C)  SpO2: 94%     Body mass index is 22.02 kg/m.  Objective:   Physical Exam  Constitutional: She is oriented to person, place, and time. She appears well-developed and well-nourished. No distress.  HENT:  Head: Normocephalic and atraumatic.  Eyes: Pupils are equal, round, and reactive to light. Conjunctivae are normal.  Neck: No JVD present.  Cardiovascular: Normal rate, regular rhythm and intact distal pulses.  No murmur heard. Pulmonary/Chest: Effort normal and breath sounds normal. She has no wheezes. She has no rales.  Abdominal: Soft. Bowel sounds are normal. There is no rebound.  Musculoskeletal:         General: No edema.  Lymphadenopathy:    She has no cervical adenopathy.  Neurological: She is alert and oriented to person, place, and time. No cranial nerve deficit.  Skin: Skin is warm and dry.  Psychiatric: She has a normal mood and affect.  Nursing note and vitals reviewed.         Assessment & Recommendations:   73 y.o. Caucasian female with controlled hypertension, hyperlipidemia, h/o pancreatitis, family h/o CAD, now with sudden episodes of chest pain, shortness of breath at rest, paresthesias in feet.  Chest pain, shortness of breath: While these episodes could well be panic attacks, she has risk factors for CAD including hyperlipidemia with LDL of 206, hypertension, and family history of  CAD.  Resting EKG is normal.  I will obtain Lexiscan nuclear stress test to evaluate for obstructive CAD.  Physical exam unremarkable for heart failure, therefore not ordering echocardiogram at this time.  Continue aspirin.  Continue current antihypertensive therapy.  Hyperlipidemia: Intolerant to several different lipid-lowering medications in the past.  She absolutely needs lipid-lowering therapy to reduce her cholesterol and LDL, which is likely due to familial hyperlipidemia.  I have convinced her to try Crestor 10 mg daily, something she has not tried in the past.  If she remains intolerant to statins, will try Repatha.  Paresthesias:  We will obtain ABI to exclude PAD.  Follow-up virtual visit after above tests.   Thank you for referring the patient to [place]. Please feel free to contact with any questions.  Nigel Mormon, MD [facility] [place] Pager: [phone] Office: [phone] If no answer Cell [phone]

## 2018-11-19 ENCOUNTER — Ambulatory Visit: Payer: Medicare Other | Admitting: Cardiology

## 2018-11-19 ENCOUNTER — Other Ambulatory Visit: Payer: Self-pay

## 2018-11-19 ENCOUNTER — Encounter: Payer: Self-pay | Admitting: Cardiology

## 2018-11-19 VITALS — BP 128/76 | HR 70 | Temp 96.6°F | Ht 63.0 in | Wt 124.3 lb

## 2018-11-19 DIAGNOSIS — R0789 Other chest pain: Secondary | ICD-10-CM

## 2018-11-19 DIAGNOSIS — R079 Chest pain, unspecified: Secondary | ICD-10-CM

## 2018-11-19 DIAGNOSIS — R202 Paresthesia of skin: Secondary | ICD-10-CM | POA: Insufficient documentation

## 2018-11-19 DIAGNOSIS — K2970 Gastritis, unspecified, without bleeding: Secondary | ICD-10-CM | POA: Insufficient documentation

## 2018-11-19 DIAGNOSIS — R072 Precordial pain: Secondary | ICD-10-CM | POA: Insufficient documentation

## 2018-11-19 DIAGNOSIS — Z8249 Family history of ischemic heart disease and other diseases of the circulatory system: Secondary | ICD-10-CM | POA: Diagnosis not present

## 2018-11-19 DIAGNOSIS — K859 Acute pancreatitis without necrosis or infection, unspecified: Secondary | ICD-10-CM | POA: Insufficient documentation

## 2018-11-19 DIAGNOSIS — E782 Mixed hyperlipidemia: Secondary | ICD-10-CM | POA: Diagnosis not present

## 2018-11-19 MED ORDER — EZETIMIBE 10 MG PO TABS: 10.0000 mg | ORAL_TABLET | Freq: Every day | ORAL | 11 refills | Status: DC

## 2018-11-19 MED ORDER — ROSUVASTATIN CALCIUM 10 MG PO TABS: 10.0000 mg | ORAL_TABLET | Freq: Every day | ORAL | 2 refills | Status: DC

## 2018-11-21 ENCOUNTER — Other Ambulatory Visit: Payer: Self-pay | Admitting: Gastroenterology

## 2018-11-21 DIAGNOSIS — K2950 Unspecified chronic gastritis without bleeding: Secondary | ICD-10-CM

## 2018-11-21 DIAGNOSIS — R1013 Epigastric pain: Secondary | ICD-10-CM

## 2018-11-27 DIAGNOSIS — K859 Acute pancreatitis without necrosis or infection, unspecified: Secondary | ICD-10-CM | POA: Diagnosis not present

## 2018-11-27 DIAGNOSIS — R0789 Other chest pain: Secondary | ICD-10-CM | POA: Diagnosis not present

## 2018-11-27 DIAGNOSIS — E785 Hyperlipidemia, unspecified: Secondary | ICD-10-CM | POA: Diagnosis not present

## 2018-11-27 DIAGNOSIS — I10 Essential (primary) hypertension: Secondary | ICD-10-CM | POA: Diagnosis not present

## 2018-12-22 ENCOUNTER — Other Ambulatory Visit: Payer: Self-pay

## 2018-12-22 ENCOUNTER — Ambulatory Visit (INDEPENDENT_AMBULATORY_CARE_PROVIDER_SITE_OTHER): Payer: Medicare Other

## 2018-12-22 DIAGNOSIS — R079 Chest pain, unspecified: Secondary | ICD-10-CM

## 2018-12-22 DIAGNOSIS — R0789 Other chest pain: Secondary | ICD-10-CM | POA: Diagnosis not present

## 2018-12-22 DIAGNOSIS — Z8249 Family history of ischemic heart disease and other diseases of the circulatory system: Secondary | ICD-10-CM | POA: Diagnosis not present

## 2018-12-23 ENCOUNTER — Ambulatory Visit (INDEPENDENT_AMBULATORY_CARE_PROVIDER_SITE_OTHER): Payer: Medicare Other

## 2018-12-23 DIAGNOSIS — R0789 Other chest pain: Secondary | ICD-10-CM

## 2018-12-23 DIAGNOSIS — Z8249 Family history of ischemic heart disease and other diseases of the circulatory system: Secondary | ICD-10-CM | POA: Diagnosis not present

## 2018-12-23 DIAGNOSIS — R202 Paresthesia of skin: Secondary | ICD-10-CM

## 2018-12-23 DIAGNOSIS — R079 Chest pain, unspecified: Secondary | ICD-10-CM

## 2018-12-31 ENCOUNTER — Ambulatory Visit: Payer: Medicare Other | Admitting: Cardiology

## 2018-12-31 ENCOUNTER — Encounter: Payer: Self-pay | Admitting: Cardiology

## 2018-12-31 VITALS — BP 130/90 | HR 68

## 2018-12-31 DIAGNOSIS — I10 Essential (primary) hypertension: Secondary | ICD-10-CM | POA: Diagnosis not present

## 2018-12-31 DIAGNOSIS — E782 Mixed hyperlipidemia: Secondary | ICD-10-CM

## 2018-12-31 DIAGNOSIS — Z8249 Family history of ischemic heart disease and other diseases of the circulatory system: Secondary | ICD-10-CM

## 2018-12-31 MED ORDER — ROSUVASTATIN CALCIUM 20 MG PO TABS: 10.0000 mg | ORAL_TABLET | Freq: Every day | ORAL | 2 refills | Status: DC

## 2018-12-31 NOTE — Progress Notes (Signed)
Patient referred by Crist Infante, MD for hyperlipidemia  Subjective:   Tina Erickson, female    DOB: 08-Sep-1945, 73 y.o.   MRN: [id number]  I connected withthe patient on 07/08/2020by a video enabled telemedicine application and verified that I am speaking with the correct person using two identifiers.    I discussed the limitations of evaluation and management by telemedicine and the availability of in person appointments. The patient expressed understanding and agreed to proceed.   This visit type was conducted due to national recommendations for restrictions regarding the COVID-19 Pandemic (e.g. social distancing). This format is felt to be most appropriate for this patient at this time. All issues noted in this document were discussed and addressed. No physical exam was performed (except for noted visual exam findings with Tele health visits). The patient has consented to conduct a Tele health visit and understands insurance will be billed.   Chief Complaint  Patient presents with  . Hypertension  . Hyperlipidemia    HPI  73 y.o. Caucasian female with controlled hypertension, hyperlipidemia, h/o pancreatitis, family h/o CAD.  She has not had any chest pain, palpitations, since she started taking Diovan at night. She is tolerating Crestor 10 mg without any side effects. BP is elevated.   She was told she has a "spot on her lungs" incidentally found during GI workup. She is going to undergo a chest Xray. .   She has taken Aspirin 81 mg in the past and has not had any bleeding issues.   Past Medical History:  Diagnosis Date  . Acid reflux    just occasionally  . Arthritis    at the base of my spine  . Gastritis   . Hypertension   . Pancreatitis      Past Surgical History:  Procedure Laterality Date  . BREAST BIOPSY Left 2000  . fractured tibia repair    . RHINOPLASTY       Social History   Socioeconomic History  . Marital status: Married    Spouse name:  Not on file  . Number of children: 3  . Years of education: Not on file  . Highest education level: Not on file  Occupational History  . Not on file  Social Needs  . Financial resource strain: Not on file  . Food insecurity    Worry: Not on file    Inability: Not on file  . Transportation needs    Medical: Not on file    Non-medical: Not on file  Tobacco Use  . Smoking status: Never Smoker  . Smokeless tobacco: Never Used  Substance and Sexual Activity  . Alcohol use: Not Currently  . Drug use: No  . Sexual activity: Not on file  Lifestyle  . Physical activity    Days per week: Not on file    Minutes per session: Not on file  . Stress: Not on file  Relationships  . Social Herbalist on phone: Not on file    Gets together: Not on file    Attends religious service: Not on file    Active member of club or organization: Not on file    Attends meetings of clubs or organizations: Not on file    Relationship status: Not on file  . Intimate partner violence    Fear of current or ex partner: Not on file    Emotionally abused: Not on file    Physically abused: Not on file  Forced sexual activity: Not on file  Other Topics Concern  . Not on file  Social History Narrative  . Not on file     Family History  Problem Relation Age of Onset  . Breast cancer Sister 66  . Hernia Mother   . Heart failure Father      Current Outpatient Medications on File Prior to Visit  Medication Sig Dispense Refill  . Apoaequorin (PREVAGEN PO) Take by mouth daily.    . B Complex-C (B-COMPLEX WITH VITAMIN C) tablet Take 1 tablet by mouth daily.    . cholecalciferol (VITAMIN D) 1000 UNITS tablet Take 1,000 Units by mouth daily.    Marland Kitchen co-enzyme Q-10 50 MG capsule Take 50 mg by mouth daily.    Marland Kitchen MILK THISTLE PO Take by mouth daily.    . Multiple Vitamins-Minerals (HAIR SKIN & NAILS ADVANCED PO) Take by mouth daily.    Marland Kitchen omeprazole (PRILOSEC) 40 MG capsule Take 80 mg by mouth daily.      Marland Kitchen OVER THE COUNTER MEDICATION Take 1 capsule by mouth daily. MEGA RED    . rosuvastatin (CRESTOR) 10 MG tablet Take 1 tablet (10 mg total) by mouth at bedtime. 30 tablet 2  . sucralfate (CARAFATE) 1 g tablet 2 tabs qd    . valsartan (DIOVAN) 80 MG tablet Take 80 mg by mouth daily.     No current facility-administered medications on file prior to visit.     Cardiovascular studies:  ABI 12/23/2018: This exam reveals normal perfusion of the lower extremity (RABI 0.98 and LABI 0.98).  There is mildly abnormal biphasic waveform in the right DP artery.   Lexiscan Myoview Stress Test 12/22/2018: Stress EKG is non-diagnostic, as this is pharmacological stress test. Myocardial pefusion imaging is normal. Left ventricular ejection fraction is 73% with normal wall motion. Low risk study.  EKG 11/19/2018: Sinus rhythm 66 bpm. Normal EKG.  CT abdomen 11/13/2018: 1. No acute findings or explanation for the patient's symptoms. 2. Limited evaluation of the stomach due to incomplete distention and absence of enteric contrast. Possible gastric wall thickening as can be seen with gastritis. 3. Small renal cysts. 4.  Aortic Atherosclerosis (ICD10-I70.0).  Recent labs: 11/03/2018: Glucose 88. BUN/Cr 11/0.9. eGFR normal. Na/K 139/4.1. Rest of the CMP normal. H/H 11/36. MCV 94. Platelets 244. Chol 296, TG 114, HDL 64, LDL 209 Apolipoprotein B 126 (high) TSH normal  Review of Systems  Constitution: Negative for decreased appetite, malaise/fatigue, weight gain and weight loss.  HENT: Negative for congestion.   Eyes: Negative for visual disturbance.  Cardiovascular: Negative for chest pain, dyspnea on exertion, leg swelling, palpitations and syncope.  Respiratory: Negative for cough and shortness of breath.   Endocrine: Negative for cold intolerance.  Hematologic/Lymphatic: Does not bruise/bleed easily.  Skin: Negative for itching and rash.  Musculoskeletal: Negative for myalgias.   Gastrointestinal: Negative for abdominal pain, nausea and vomiting.  Genitourinary: Negative for dysuria.  Neurological: Positive for paresthesias. Negative for dizziness and weakness.  Psychiatric/Behavioral: The patient is not nervous/anxious.   All other systems reviewed and are negative.        Vitals:   12/31/18 1102 12/31/18 1103  BP: (!) 146/101 130/90  Pulse: 80 68    Objective:   Physical Exam  Constitutional: She is oriented to person, place, and time. She appears well-developed and well-nourished. No distress.  Pulmonary/Chest: Effort normal.  Neurological: She is alert and oriented to person, place, and time.  Psychiatric: She has a normal mood and  affect.  Nursing note and vitals reviewed.         Assessment & Recommendations:   73 y.o. Caucasian female with controlled hypertension, hyperlipidemia, h/o pancreatitis, family h/o CAD, now with sudden episodes of chest pain, shortness of breath at rest, paresthesias in feet.  Chest pain, shortness of breath: Improved after she has started taking Diovan at night. Stress test does not show any ischemia/infarctin. EF is normal.   In view of her hyperlipidemia and abnormal ABI, reasonable to take aspirin 81 mg, provided no bleeding side effects notes.   Hyperlipidemia: Tolerating Crestor 10 mg daily. Increase to 20 mg daily 3 days/week then to everyday, as tolerated. Repeat lipid panel in 02/2019.   Abnormal ABI: Mildly abnormal with biphasic waveform. Recommend risk factor modification and regular exercise.   Hypertension: Needs regular monitoring. Continue Diovan 80 mg for now.  F/u w/me in 02/2019 after lipid panel.  Nigel Mormon, MD [facility] Pager: [phone] Office: [phone] If no answer Cell [phone]

## 2019-01-01 ENCOUNTER — Ambulatory Visit: Payer: Medicare Other | Admitting: Cardiology

## 2019-01-05 ENCOUNTER — Ambulatory Visit
Admission: RE | Admit: 2019-01-05 | Discharge: 2019-01-05 | Disposition: A | Discharge status: Home / Self Care | Payer: Medicare Other | Source: Ambulatory Visit | Attending: Gastroenterology | Admitting: Gastroenterology

## 2019-01-05 ENCOUNTER — Other Ambulatory Visit: Payer: Self-pay | Admitting: Gastroenterology

## 2019-01-05 DIAGNOSIS — R918 Other nonspecific abnormal finding of lung field: Secondary | ICD-10-CM | POA: Diagnosis not present

## 2019-01-05 DIAGNOSIS — R9389 Abnormal findings on diagnostic imaging of other specified body structures: Secondary | ICD-10-CM

## 2019-01-07 ENCOUNTER — Other Ambulatory Visit: Payer: Self-pay | Admitting: Gastroenterology

## 2019-01-07 DIAGNOSIS — R9389 Abnormal findings on diagnostic imaging of other specified body structures: Secondary | ICD-10-CM

## 2019-01-08 ENCOUNTER — Other Ambulatory Visit: Payer: Self-pay

## 2019-01-08 DIAGNOSIS — E782 Mixed hyperlipidemia: Secondary | ICD-10-CM

## 2019-01-08 MED ORDER — ROSUVASTATIN CALCIUM 20 MG PO TABS: 20.0000 mg | ORAL_TABLET | Freq: Every day | ORAL | 2 refills | Status: DC

## 2019-01-09 ENCOUNTER — Ambulatory Visit
Admission: RE | Admit: 2019-01-09 | Discharge: 2019-01-09 | Disposition: A | Discharge status: Home / Self Care | Payer: Medicare Other | Source: Ambulatory Visit | Attending: Gastroenterology | Admitting: Gastroenterology

## 2019-01-09 DIAGNOSIS — R918 Other nonspecific abnormal finding of lung field: Secondary | ICD-10-CM | POA: Diagnosis not present

## 2019-01-09 DIAGNOSIS — R9389 Abnormal findings on diagnostic imaging of other specified body structures: Secondary | ICD-10-CM

## 2019-01-09 MED ORDER — IOPAMIDOL (ISOVUE-300) INJECTION 61%
75.0000 mL | Freq: Once | INTRAVENOUS | Status: AC | PRN
  Administered 2019-01-09: 75 mL via INTRAVENOUS

## 2019-01-12 DIAGNOSIS — R918 Other nonspecific abnormal finding of lung field: Secondary | ICD-10-CM | POA: Insufficient documentation

## 2019-01-14 DIAGNOSIS — R918 Other nonspecific abnormal finding of lung field: Secondary | ICD-10-CM | POA: Diagnosis not present

## 2019-01-14 DIAGNOSIS — Z85828 Personal history of other malignant neoplasm of skin: Secondary | ICD-10-CM | POA: Diagnosis not present

## 2019-01-14 DIAGNOSIS — K219 Gastro-esophageal reflux disease without esophagitis: Secondary | ICD-10-CM | POA: Diagnosis not present

## 2019-01-14 DIAGNOSIS — D2261 Melanocytic nevi of right upper limb, including shoulder: Secondary | ICD-10-CM | POA: Diagnosis not present

## 2019-01-14 DIAGNOSIS — D2262 Melanocytic nevi of left upper limb, including shoulder: Secondary | ICD-10-CM | POA: Diagnosis not present

## 2019-01-14 DIAGNOSIS — L821 Other seborrheic keratosis: Secondary | ICD-10-CM | POA: Diagnosis not present

## 2019-01-14 DIAGNOSIS — K2970 Gastritis, unspecified, without bleeding: Secondary | ICD-10-CM | POA: Diagnosis not present

## 2019-01-27 ENCOUNTER — Institutional Professional Consult (permissible substitution): Payer: Medicare Other | Admitting: Pulmonary Disease

## 2019-02-09 NOTE — Progress Notes (Signed)
Subjective:   PATIENT ID: Tina Erickson GENDER: female DOB: 12/17/1945, MRN: [id number]   HPI  Chief Complaint  Patient presents with  . Consult    abnormal CT    Reason for Visit: New consult for abnormal CT chest  [name] is 73 year old never smoker with HTN and HLD who presents as a new consult for abnormal CT.  During work-up for epigastric pain, she underwent CT Abdomen and incidentally found with RML tree-in-bud opacity.  Repeat CXR 01/05/19 was concerning for right infrahilar mass. CT Chest was ordered and she was referred to Pulmonary. She denies any previous pulmonary symptoms however after recent change in her blood pressure medications she does report mild shortness of breath that began a few weeks ago. Unclear trigger. Nothing improves it or worsens it. Otherwise denies any issues respiratory issues before that. She played golf earlier this week with no issues. Denies dyspnea on exertion, chest pain, cough, wheezing, sputum production.  Social History: Never smoker  Environmental exposures: None  I have personally reviewed patient's past medical/family/social history, allergies, current medications.  Past Medical History:  Diagnosis Date  . Acid reflux    just occasionally  . Arthritis    at the base of my spine  . Gastritis   . Hypertension   . Pancreatitis      Family History  Problem Relation Age of Onset  . Breast cancer Sister 48  . Hernia Mother   . Heart failure Father      Social History   Occupational History  . Not on file  Tobacco Use  . Smoking status: Never Smoker  . Smokeless tobacco: Never Used  Substance and Sexual Activity  . Alcohol use: Not Currently  . Drug use: No  . Sexual activity: Not on file    Allergies  Allergen Reactions  . Pravachol [Pravastatin] Other (See Comments)    Thought it affected her memory and thinking  . Prednisone Other (See Comments)    Pt. Stated "it made me crazy"     Outpatient  Medications Prior to Visit  Medication Sig Dispense Refill  . Apoaequorin (PREVAGEN PO) Take by mouth daily.    . B Complex-C (B-COMPLEX WITH VITAMIN C) tablet Take 1 tablet by mouth daily.    . cholecalciferol (VITAMIN D) 1000 UNITS tablet Take 1,000 Units by mouth daily.    Marland Kitchen co-enzyme Q-10 50 MG capsule Take 50 mg by mouth daily.    Marland Kitchen MILK THISTLE PO Take by mouth daily.    . Multiple Vitamins-Minerals (HAIR SKIN & NAILS ADVANCED PO) Take by mouth daily.    Marland Kitchen omeprazole (PRILOSEC) 40 MG capsule Take 80 mg by mouth daily.     Marland Kitchen OVER THE COUNTER MEDICATION Take 1 capsule by mouth daily. MEGA RED    . rosuvastatin (CRESTOR) 20 MG tablet Take 1 tablet (20 mg total) by mouth at bedtime. 90 tablet 2  . valsartan (DIOVAN) 80 MG tablet Take 80 mg by mouth daily.     No facility-administered medications prior to visit.     Review of Systems  Constitutional: Positive for malaise/fatigue. Negative for chills, diaphoresis, fever and weight loss.  HENT: Negative for congestion, ear pain and sore throat.   Respiratory: Negative for cough, hemoptysis, sputum production, shortness of breath and wheezing.   Cardiovascular: Positive for chest pain. Negative for palpitations and leg swelling.  Gastrointestinal: Negative for abdominal pain, heartburn and nausea.  Genitourinary: Negative for frequency.  Musculoskeletal:  Negative for joint pain and myalgias.  Skin: Negative for itching and rash.  Neurological: Negative for dizziness, weakness and headaches.  Endo/Heme/Allergies: Does not bruise/bleed easily.  Psychiatric/Behavioral: Negative for depression. The patient is not nervous/anxious.     Objective:   Vitals:   02/10/19 1558  BP: 130/80  Pulse: 73  Temp: 98.6 F (37 C)  TempSrc: Oral  SpO2: 99%  Weight: 125 lb 12.8 oz (57.1 kg)  Height: 5\' 3"  (1.6 m)     Physical Exam: General: Well-appearing, no acute distress HENT: [place], AT Eyes: EOMI, no scleral icterus Respiratory: Clear to  auscultation bilaterally.  No crackles, wheezing or rales Cardiovascular: RRR, -M/R/G, no JVD GI: BS+, soft, nontender Extremities:-Edema,-tenderness Neuro: AAO x4, CNII-XII grossly intact Skin: Intact, no rashes or bruising Psych: Normal mood, normal affect  Data Reviewed:  Imaging: CT Abdomen 11/13/18-mild RML tree in bud opacity CT Chest 01/09/19- scattered focal areas of tree-in-bud opacities in lower lobes bilaterally. RML bronchocele. Posterior opacity in RML, 77mm RLL nodule: recommended follow-up  PFT: None on file  Labs: CBC No results found for: WBC, RBC, HGB, HCT, PLT, MCV, MCH, MCHC, RDW, LYMPHSABS, MONOABS, EOSABS, BASOSABS  BMET No results found for: NA, K, CL, CO2, GLUCOSE, BUN, CREATININE, CALCIUM, GFRNONAA, GFRAA  Imaging, labs and tests noted above have been reviewed independently by me.    Assessment & Plan:   Discussion: 73 year old female with HTN, HLD who presents with CT with incidental opacity. Mild shortness of breath.  Abnormal Lung CT with RML opacity, RLL nodule  Schedule CT Chest without contrast from the time image was taken Prescribed antibiotics to treat potential infection  Health Maintenance  There is no immunization history on file for this patient.  CT Lung Screen - not qualified  Orders Placed This Encounter  Procedures  . CT Chest Wo Contrast    Needs to schedule 9/7-9/10. SS. Chantel [phone]/ [employer]     Standing Status:   Future    Standing Expiration Date:   04/11/2020    Order Specific Question:   ** REASON FOR EXAM (FREE TEXT)    Answer:   RLL nodule    Order Specific Question:   Preferred imaging location?    Answer:   [facility]    Order Specific Question:   Radiology Contrast Protocol - do NOT remove file path    Answer:   [email or web address]   Meds ordered this encounter  Medications  . amoxicillin-clavulanate (AUGMENTIN) 875-125 MG tablet    Sig: Take 1 tablet by mouth 2 (two)  times daily.    Dispense:  10 tablet    Refill:  0    Return in about 3 weeks (around 03/03/2019).  [place], MD [facility] 02/09/2019 9:41 PM  Office Number [phone]

## 2019-02-10 ENCOUNTER — Ambulatory Visit: Payer: Medicare Other | Admitting: Pulmonary Disease

## 2019-02-10 ENCOUNTER — Encounter: Payer: Self-pay | Admitting: Pulmonary Disease

## 2019-02-10 ENCOUNTER — Other Ambulatory Visit: Payer: Self-pay

## 2019-02-10 VITALS — BP 130/80 | HR 73 | Temp 98.6°F | Ht 63.0 in | Wt 125.8 lb

## 2019-02-10 DIAGNOSIS — R911 Solitary pulmonary nodule: Secondary | ICD-10-CM

## 2019-02-10 DIAGNOSIS — R918 Other nonspecific abnormal finding of lung field: Secondary | ICD-10-CM | POA: Diagnosis not present

## 2019-02-10 MED ORDER — AMOXICILLIN-POT CLAVULANATE 875-125 MG PO TABS: 1.0000 | ORAL_TABLET | Freq: Two times a day (BID) | ORAL | 0 refills | Status: DC

## 2019-02-10 NOTE — Patient Instructions (Addendum)
Abnormal Lung CT with RML opacity, RLL nodule  Schedule CT Chest without contrast from the time image was taken Prescribed antibiotics to treat potential infection  Follow-up with me in 3 weeks after your CT scan

## 2019-02-14 DIAGNOSIS — R918 Other nonspecific abnormal finding of lung field: Secondary | ICD-10-CM | POA: Insufficient documentation

## 2019-02-14 DIAGNOSIS — R911 Solitary pulmonary nodule: Secondary | ICD-10-CM | POA: Insufficient documentation

## 2019-02-16 ENCOUNTER — Telehealth: Payer: Self-pay | Admitting: Pulmonary Disease

## 2019-02-16 NOTE — Telephone Encounter (Signed)
ATC Tina Erickson to verify fax number, transferred to medical records twice with no answer.  8.18.2020 office note faxed Will keep note open to verify receipt tomorrow

## 2019-02-17 NOTE — Telephone Encounter (Signed)
Called [facility] at [phone] and left detailed VM to call back with their fax number.

## 2019-02-18 NOTE — Telephone Encounter (Signed)
Tina Erickson from [facility] called with the fax number.  Fax number is [phone].

## 2019-02-18 NOTE — Telephone Encounter (Signed)
Called [facility] and they are unable to verify receipt at this time as it can take a day or 2 for the notes to be scanned into the chart.  Advised will sign off on note and they can call if the office note was not received.

## 2019-02-18 NOTE — Telephone Encounter (Signed)
A confirmation was never received but office note was faxed by myself, personally.  Called [facility] and spoke with Donita who verified that office note was not received.  Fax number verified and sent again.  Will call [facility] again after lunch to ensure receipt.

## 2019-02-18 NOTE — Telephone Encounter (Signed)
Tina Erickson did we receive confirmation on the fax you sent on Monday??

## 2019-03-05 ENCOUNTER — Other Ambulatory Visit: Payer: Self-pay

## 2019-03-05 ENCOUNTER — Ambulatory Visit (INDEPENDENT_AMBULATORY_CARE_PROVIDER_SITE_OTHER)
Admission: RE | Admit: 2019-03-05 | Discharge: 2019-03-05 | Disposition: A | Discharge status: Home / Self Care | Payer: Medicare Other | Source: Ambulatory Visit | Attending: Pulmonary Disease | Admitting: Pulmonary Disease

## 2019-03-05 DIAGNOSIS — R911 Solitary pulmonary nodule: Secondary | ICD-10-CM

## 2019-03-06 ENCOUNTER — Telehealth: Payer: Self-pay | Admitting: Pulmonary Disease

## 2019-03-06 DIAGNOSIS — R918 Other nonspecific abnormal finding of lung field: Secondary | ICD-10-CM

## 2019-03-06 NOTE — Telephone Encounter (Signed)
Called spoke with patient. Let her know JE recs. I placed an order for repeat CT Chest in 6 months.   Patient voiced understanding. Nothing further needed at this time

## 2019-03-18 ENCOUNTER — Encounter: Payer: Self-pay | Admitting: Cardiology

## 2019-03-19 ENCOUNTER — Ambulatory Visit: Payer: Medicare Other | Admitting: Cardiology

## 2019-03-19 NOTE — Progress Notes (Deleted)
Patient referred by Crist Infante, MD for hyperlipidemia  Subjective:   Tina Erickson, female    DOB: 08/18/1945, 73 y.o.   MRN: [id number]  *** No chief complaint on file.   HPI  73 y.o. Caucasian female with controlled hypertension, hyperlipidemia, h/o pancreatitis, family h/o CAD.  Work-up showed no ischemia or infarction on nuclear stress test.  EF was normal.  MPI was mildly abnormal with biphasic waveform.  I recommended aggressive medical management.  She has not had any chest pain, palpitations, since she started taking Diovan at night. She is tolerating Crestor 10 mg without any side effects. BP is elevated.   She was told she has a "spot on her lungs" incidentally found during GI workup. She is going to undergo a chest Xray. .   She has taken Aspirin 81 mg in the past and has not had any bleeding issues.   Past Medical History:  Diagnosis Date  . Acid reflux    just occasionally  . Arthritis    at the base of my spine  . Gastritis   . Hyperlipidemia   . Hypertension   . Pancreatitis      Past Surgical History:  Procedure Laterality Date  . BREAST BIOPSY Left 2000  . fractured tibia repair    . RHINOPLASTY       Social History   Socioeconomic History  . Marital status: Married    Spouse name: Not on file  . Number of children: 3  . Years of education: Not on file  . Highest education level: Not on file  Occupational History  . Not on file  Social Needs  . Financial resource strain: Not on file  . Food insecurity    Worry: Not on file    Inability: Not on file  . Transportation needs    Medical: Not on file    Non-medical: Not on file  Tobacco Use  . Smoking status: Never Smoker  . Smokeless tobacco: Never Used  Substance and Sexual Activity  . Alcohol use: Not Currently  . Drug use: No  . Sexual activity: Not on file  Lifestyle  . Physical activity    Days per week: Not on file    Minutes per session: Not on file  . Stress: Not on  file  Relationships  . Social Herbalist on phone: Not on file    Gets together: Not on file    Attends religious service: Not on file    Active member of club or organization: Not on file    Attends meetings of clubs or organizations: Not on file    Relationship status: Not on file  . Intimate partner violence    Fear of current or ex partner: Not on file    Emotionally abused: Not on file    Physically abused: Not on file    Forced sexual activity: Not on file  Other Topics Concern  . Not on file  Social History Narrative  . Not on file     Family History  Problem Relation Age of Onset  . Breast cancer Sister 60  . Migraines Sister   . Hernia Mother   . Migraines Mother   . Ulcers Mother   . Heart failure Father   . Ulcers Father   . Obesity Sister      Current Outpatient Medications on File Prior to Visit  Medication Sig Dispense Refill  . Apoaequorin (PREVAGEN PO) Take by  mouth daily.    . cholecalciferol (VITAMIN D) 1000 UNITS tablet Take 1,000 Units by mouth daily.    Marland Kitchen co-enzyme Q-10 50 MG capsule Take 50 mg by mouth daily.    Marland Kitchen esomeprazole (NEXIUM) 40 MG capsule Take 1 capsule by mouth daily.    Marland Kitchen MILK THISTLE PO Take by mouth daily.    . Multiple Vitamins-Minerals (HAIR SKIN & NAILS ADVANCED PO) Take by mouth daily.    Marland Kitchen OVER THE COUNTER MEDICATION Take 1 capsule by mouth daily. MEGA RED    . rosuvastatin (CRESTOR) 20 MG tablet Take 1 tablet (20 mg total) by mouth at bedtime. 90 tablet 2  . sucralfate (CARAFATE) 1 g tablet Take 1 tablet by mouth 3 (three) times daily.    . valsartan (DIOVAN) 80 MG tablet Take 80 mg by mouth daily.     No current facility-administered medications on file prior to visit.     Cardiovascular studies:  ABI 12/23/2018: This exam reveals normal perfusion of the lower extremity (RABI 0.98 and LABI 0.98).  There is mildly abnormal biphasic waveform in the right DP artery.   Lexiscan Myoview Stress Test 12/22/2018:  Stress EKG is non-diagnostic, as this is pharmacological stress test. Myocardial pefusion imaging is normal. Left ventricular ejection fraction is 73% with normal wall motion. Low risk study.  EKG 11/19/2018: Sinus rhythm 66 bpm. Normal EKG.  CT abdomen 11/13/2018: 1. No acute findings or explanation for the patient's symptoms. 2. Limited evaluation of the stomach due to incomplete distention and absence of enteric contrast. Possible gastric wall thickening as can be seen with gastritis. 3. Small renal cysts. 4.  Aortic Atherosclerosis (ICD10-I70.0).  Recent labs: 11/03/2018: Glucose 88. BUN/Cr 11/0.9. eGFR normal. Na/K 139/4.1. Rest of the CMP normal. H/H 11/36. MCV 94. Platelets 244. Chol 296, TG 114, HDL 64, LDL 209 Apolipoprotein B 126 (high) TSH normal  Review of Systems  Constitution: Negative for decreased appetite, malaise/fatigue, weight gain and weight loss.  HENT: Negative for congestion.   Eyes: Negative for visual disturbance.  Cardiovascular: Negative for chest pain, dyspnea on exertion, leg swelling, palpitations and syncope.  Respiratory: Negative for cough and shortness of breath.   Endocrine: Negative for cold intolerance.  Hematologic/Lymphatic: Does not bruise/bleed easily.  Skin: Negative for itching and rash.  Musculoskeletal: Negative for myalgias.  Gastrointestinal: Negative for abdominal pain, nausea and vomiting.  Genitourinary: Negative for dysuria.  Neurological: Positive for paresthesias. Negative for dizziness and weakness.  Psychiatric/Behavioral: The patient is not nervous/anxious.   All other systems reviewed and are negative.       *** There were no vitals filed for this visit.  *** Objective:   Physical Exam  Constitutional: She is oriented to person, place, and time. She appears well-developed and well-nourished. No distress.  Pulmonary/Chest: Effort normal.  Neurological: She is alert and oriented to person, place, and time.   Psychiatric: She has a normal mood and affect.  Nursing note and vitals reviewed.         Assessment & Recommendations:   73 y.o. Caucasian female with controlled hypertension, hyperlipidemia, h/o pancreatitis, family h/o CAD, now with sudden episodes of chest pain, shortness of breath at rest, paresthesias in feet.  Chest pain, shortness of breath: Improved after she has started taking Diovan at night. Stress test does not show any ischemia/infarctin. EF is normal.   In view of her hyperlipidemia and abnormal ABI, reasonable to take aspirin 81 mg, provided no bleeding side effects notes.   Hyperlipidemia:  Tolerating Crestor 10 mg daily. Increase to 20 mg daily 3 days/week then to everyday, as tolerated. Repeat lipid panel in 02/2019.   Abnormal ABI: Mildly abnormal with biphasic waveform. Recommend risk factor modification and regular exercise.   Hypertension: Needs regular monitoring. Continue Diovan 80 mg for now.  F/u w/me in 02/2019 after lipid panel.  Nigel Mormon, MD [facility] Pager: [phone] Office: [phone] If no answer Cell [phone]

## 2019-03-20 DIAGNOSIS — E782 Mixed hyperlipidemia: Secondary | ICD-10-CM | POA: Diagnosis not present

## 2019-03-21 LAB — LIPID PANEL
Chol/HDL Ratio: 2.2 ratio (ref 0.0–4.4)
Cholesterol, Total: 181 mg/dL (ref 100–199)
HDL: 82 mg/dL (ref >39)
LDL Chol Calc (NIH): 79 mg/dL (ref 0–99)
Triglycerides: 114 mg/dL (ref 0–149)
VLDL Cholesterol Cal: 20 mg/dL (ref 5–40)

## 2019-03-24 NOTE — Progress Notes (Signed)
Called pt to inform her about her lab results. Pt understood

## 2019-04-03 ENCOUNTER — Encounter: Payer: Self-pay | Admitting: Cardiology

## 2019-04-03 ENCOUNTER — Other Ambulatory Visit: Payer: Self-pay

## 2019-04-03 ENCOUNTER — Ambulatory Visit: Payer: Medicare Other | Admitting: Cardiology

## 2019-04-03 VITALS — BP 129/81 | HR 68 | Temp 97.3°F | Ht 63.0 in | Wt 125.9 lb

## 2019-04-03 DIAGNOSIS — I10 Essential (primary) hypertension: Secondary | ICD-10-CM | POA: Diagnosis not present

## 2019-04-03 DIAGNOSIS — E782 Mixed hyperlipidemia: Secondary | ICD-10-CM

## 2019-04-03 MED ORDER — ROSUVASTATIN CALCIUM 20 MG PO TABS: 20.0000 mg | ORAL_TABLET | Freq: Every day | ORAL | 3 refills | Status: DC

## 2019-04-03 NOTE — Progress Notes (Signed)
Patient referred by Crist Infante, MD for hyperlipidemia  Subjective:   Tina Erickson, female    DOB: [birth year], 73 y.o.   MRN: [id number]    Chief Complaint  Patient presents with  . Hypertension  . Hyperlipidemia  . Follow-up    2 months    HPI  73 y.o. Caucasian female with controlled hypertension, hyperlipidemia, h/o pancreatitis, family h/o CAD.  She is tolerating Crestor very well and has significant improvement in her lipid panel, details below. She is following pulmonology regarding incidental opacity noted on CT chest. She denies chest pain, shortness of breath, palpitations, leg edema, orthopnea, PND, TIA/syncope.  Past Medical History:  Diagnosis Date  . Acid reflux    just occasionally  . Arthritis    at the base of my spine  . Gastritis   . Hyperlipidemia   . Hypertension   . Pancreatitis      Past Surgical History:  Procedure Laterality Date  . BREAST BIOPSY Left 2000  . fractured tibia repair    . RHINOPLASTY       Social History   Socioeconomic History  . Marital status: Married    Spouse name: Not on file  . Number of children: 3  . Years of education: Not on file  . Highest education level: Not on file  Occupational History  . Not on file  Social Needs  . Financial resource strain: Not on file  . Food insecurity    Worry: Not on file    Inability: Not on file  . Transportation needs    Medical: Not on file    Non-medical: Not on file  Tobacco Use  . Smoking status: Never Smoker  . Smokeless tobacco: Never Used  Substance and Sexual Activity  . Alcohol use: Not Currently  . Drug use: No  . Sexual activity: Not on file  Lifestyle  . Physical activity    Days per week: Not on file    Minutes per session: Not on file  . Stress: Not on file  Relationships  . Social Herbalist on phone: Not on file    Gets together: Not on file    Attends religious service: Not on file    Active member of club or organization: Not  on file    Attends meetings of clubs or organizations: Not on file    Relationship status: Not on file  . Intimate partner violence    Fear of current or ex partner: Not on file    Emotionally abused: Not on file    Physically abused: Not on file    Forced sexual activity: Not on file  Other Topics Concern  . Not on file  Social History Narrative  . Not on file     Family History  Problem Relation Age of Onset  . Breast cancer Sister 24  . Migraines Sister   . Hernia Mother   . Migraines Mother   . Ulcers Mother   . Heart failure Father   . Ulcers Father   . Obesity Sister      Current Outpatient Medications on File Prior to Visit  Medication Sig Dispense Refill  . Apoaequorin (PREVAGEN PO) Take by mouth daily.    . cholecalciferol (VITAMIN D) 1000 UNITS tablet Take 1,000 Units by mouth daily.    Marland Kitchen co-enzyme Q-10 50 MG capsule Take 50 mg by mouth daily.    Marland Kitchen esomeprazole (NEXIUM) 40 MG capsule Take 1 capsule  by mouth daily.    . MILK THISTLE PO Take by mouth daily.    . Multiple Vitamins-Minerals (HAIR SKIN & NAILS ADVANCED PO) Take by mouth daily.    . OVER THE COUNTER MEDICATION Take 1 capsule by mouth daily. MEGA RED    . rosuvastatin (CRESTOR) 20 MG tablet Take 1 tablet (20 mg total) by mouth at bedtime. 90 tablet 2  . sucralfate (CARAFATE) 1 g tablet Take 1 tablet by mouth 3 (three) times daily.    . valsartan (DIOVAN) 80 MG tablet Take 80 mg by mouth daily.     No current facility-administered medications on file prior to visit.     Cardiovascular studies:  ABI 12/23/2018: This exam reveals normal perfusion of the lower extremity (RABI 0.98 and LABI 0.98).  There is mildly abnormal biphasic waveform in the right DP artery.   Lexiscan Myoview Stress Test 12/22/2018: Stress EKG is non-diagnostic, as this is pharmacological stress test. Myocardial pefusion imaging is normal. Left ventricular ejection fraction is 73% with normal wall motion. Low risk study.  EKG  11/19/2018: Sinus rhythm 66 bpm. Normal EKG.  CT abdomen 11/13/2018: 1. No acute findings or explanation for the patient's symptoms. 2. Limited evaluation of the stomach due to incomplete distention and absence of enteric contrast. Possible gastric wall thickening as can be seen with gastritis. 3. Small renal cysts. 4.  Aortic Atherosclerosis (ICD10-I70.0).  Recent labs: Results for Gossen, Bonna T (MRN [id number]) as of 04/03/2019 10:04  Ref. Range 03/20/2019 10:48  Total CHOL/HDL Ratio Latest Ref Range: 0.0 - 4.4 ratio 2.2  Cholesterol, Total Latest Ref Range: 100 - 199 mg/dL 181  HDL Cholesterol Latest Ref Range: >39 mg/dL 82  Triglycerides Latest Ref Range: 0 - 149 mg/dL 114  VLDL Cholesterol Cal Latest Ref Range: 5 - 40 mg/dL 20  LDL Chol Calc (NIH) Latest Ref Range: 0 - 99 mg/dL 79    11/03/2018: Glucose 88. BUN/Cr 11/0.9. eGFR normal. Na/K 139/4.1. Rest of the CMP normal. H/H 11/36. MCV 94. Platelets 244. Chol 296, TG 114, HDL 64, LDL 209 Apolipoprotein B 126 (high) TSH normal  Review of Systems  Constitution: Negative for decreased appetite, malaise/fatigue, weight gain and weight loss.  HENT: Negative for congestion.   Eyes: Negative for visual disturbance.  Cardiovascular: Negative for chest pain, dyspnea on exertion, leg swelling, palpitations and syncope.  Respiratory: Negative for cough and shortness of breath.   Endocrine: Negative for cold intolerance.  Hematologic/Lymphatic: Does not bruise/bleed easily.  Skin: Negative for itching and rash.  Musculoskeletal: Negative for myalgias.  Gastrointestinal: Negative for abdominal pain, nausea and vomiting.  Genitourinary: Negative for dysuria.  Neurological: Positive for paresthesias. Negative for dizziness and weakness.  Psychiatric/Behavioral: The patient is not nervous/anxious.   All other systems reviewed and are negative.        Vitals:   04/03/19 1125  BP: 129/81  Pulse: 68  Temp: (!) 97.3 F (36.3 C)   SpO2: 98%    Objective:   Physical Exam  Constitutional: She is oriented to person, place, and time. She appears well-developed and well-nourished. No distress.  Pulmonary/Chest: Effort normal.  Neurological: She is alert and oriented to person, place, and time.  Psychiatric: She has a normal mood and affect.  Nursing note and vitals reviewed.         Assessment & Recommendations:   73 y.o. Caucasian female with controlled hypertension, hyperlipidemia, h/o pancreatitis, family h/o CAD.  Hyperlipidemia: LDL down from 209 to 79. Continue Crestor   20 mg daily. Tolerating well.  Hypertension: Well controlled.   F/u w/me in 03/2020 after lipid panel.  Manish J Patwardhan, MD [facility] Pager: [phone] Office: [phone] If no answer Cell [phone]   

## 2019-04-04 ENCOUNTER — Encounter: Payer: Self-pay | Admitting: Cardiology

## 2019-05-11 DIAGNOSIS — D649 Anemia, unspecified: Secondary | ICD-10-CM | POA: Diagnosis not present

## 2019-05-11 DIAGNOSIS — K859 Acute pancreatitis without necrosis or infection, unspecified: Secondary | ICD-10-CM | POA: Diagnosis not present

## 2019-05-11 DIAGNOSIS — R918 Other nonspecific abnormal finding of lung field: Secondary | ICD-10-CM | POA: Diagnosis not present

## 2019-05-11 DIAGNOSIS — R0789 Other chest pain: Secondary | ICD-10-CM | POA: Diagnosis not present

## 2019-05-11 DIAGNOSIS — K921 Melena: Secondary | ICD-10-CM | POA: Diagnosis not present

## 2019-05-26 ENCOUNTER — Ambulatory Visit: Payer: Medicare Other | Admitting: Orthopaedic Surgery

## 2019-05-26 ENCOUNTER — Other Ambulatory Visit: Payer: Self-pay

## 2019-05-26 DIAGNOSIS — R102 Pelvic and perineal pain: Secondary | ICD-10-CM | POA: Diagnosis not present

## 2019-05-26 NOTE — Progress Notes (Signed)
Office Visit Note   Patient: Tina Erickson           Date of Birth: 11/05/1945           MRN: [id number] Visit Date: 05/26/2019              Requested by: Crist Infante, MD [address] [place],  [place] [address] PCP: Crist Infante, MD   Assessment & Plan: Visit Diagnoses:  1. Pelvic pain     Plan: Impression is bilateral ischial tuberosity pain.  Patient has not gotten much relief from conservative measures.  At this point we need to obtain an MRI to evaluate for structural normality such as chronic tendinopathy of the proximal hamstring which I think is a likely diagnosis.  Follow-Up Instructions: Return if symptoms worsen or fail to improve.   Orders:  No orders of the defined types were placed in this encounter.  No orders of the defined types were placed in this encounter.     Procedures: No procedures performed   Clinical Data: No additional findings.   Subjective: No chief complaint on file.   Tina Erickson is a 73 year old female comes in for evaluation of bilateral buttock pain.  She states that the pain is worse when sitting on a soft surface and better on a hard surface.  She states that coughing makes it worse sometimes.  She has had injections in bilateral ischial tuberosity versus many years ago without significant relief.  She states that she had MRI of this area 12 years ago.  She denies any numbness and tingling or any groin pain.  Denies any back pain.   Review of Systems  Constitutional: Negative.   HENT: Negative.   Eyes: Negative.   Respiratory: Negative.   Cardiovascular: Negative.   Endocrine: Negative.   Musculoskeletal: Negative.   Neurological: Negative.   Hematological: Negative.   Psychiatric/Behavioral: Negative.   All other systems reviewed and are negative.    Objective: Vital Signs: There were no vitals taken for this visit.  Physical Exam Vitals signs and nursing note reviewed.  Constitutional:      Appearance: She is  well-developed.  HENT:     Head: Normocephalic and atraumatic.  Neck:     Musculoskeletal: Neck supple.  Pulmonary:     Effort: Pulmonary effort is normal.  Abdominal:     Palpations: Abdomen is soft.  Skin:    General: Skin is warm.     Capillary Refill: Capillary refill takes less than 2 seconds.  Neurological:     Mental Status: She is alert and oriented to person, place, and time.  Psychiatric:        Behavior: Behavior normal.        Thought Content: Thought content normal.        Judgment: Judgment normal.     Ortho Exam    Pelvis exam shows tenderness directly over the ischial tuberosity and slightly over the proximal hamstrings insertion.  Otherwise exam is unremarkable.  She does not have significant pain with passive stretch of the hamstrings. Specialty Comments:  No specialty comments available.  Imaging: No results found.   PMFS History: Patient Active Problem List   Diagnosis Date Noted  . Right middle lobe pulmonary infiltrate 02/14/2019  . Solitary pulmonary nodule 02/14/2019  . Essential hypertension 12/31/2018  . Chest pain of uncertain etiology [id number]  . Paresthesia 11/19/2018  . Mixed hyperlipidemia 11/19/2018  . Family history of early CAD 11/19/2018  . Pancreatitis   .  Gastritis    Past Medical History:  Diagnosis Date  . Acid reflux    just occasionally  . Arthritis    at the base of my spine  . Gastritis   . Hyperlipidemia   . Hypertension   . Pancreatitis     Family History  Problem Relation Age of Onset  . Breast cancer Sister 86  . Migraines Sister   . Hernia Mother   . Migraines Mother   . Ulcers Mother   . Heart failure Father   . Ulcers Father   . Obesity Sister     Past Surgical History:  Procedure Laterality Date  . BREAST BIOPSY Left 2000  . fractured tibia repair    . RHINOPLASTY     Social History   Occupational History  . Not on file  Tobacco Use  . Smoking status: Never Smoker  . Smokeless tobacco:  Never Used  Substance and Sexual Activity  . Alcohol use: Not Currently  . Drug use: No  . Sexual activity: Not on file

## 2019-05-27 NOTE — Addendum Note (Signed)
Addended by: Precious Bard on: 05/27/2019 10:41 AM   Modules accepted: Orders

## 2019-06-11 ENCOUNTER — Other Ambulatory Visit: Payer: Self-pay

## 2019-06-11 DIAGNOSIS — E782 Mixed hyperlipidemia: Secondary | ICD-10-CM

## 2019-06-11 MED ORDER — ROSUVASTATIN CALCIUM 20 MG PO TABS: 20.0000 mg | ORAL_TABLET | Freq: Every day | ORAL | 3 refills | Status: DC

## 2019-06-22 ENCOUNTER — Other Ambulatory Visit: Payer: Self-pay

## 2019-06-22 ENCOUNTER — Ambulatory Visit
Admission: RE | Admit: 2019-06-22 | Discharge: 2019-06-22 | Disposition: A | Discharge status: Home / Self Care | Payer: Medicare Other | Source: Ambulatory Visit | Attending: Orthopaedic Surgery | Admitting: Orthopaedic Surgery

## 2019-06-22 DIAGNOSIS — R102 Pelvic and perineal pain: Secondary | ICD-10-CM

## 2019-06-22 DIAGNOSIS — K5730 Diverticulosis of large intestine without perforation or abscess without bleeding: Secondary | ICD-10-CM | POA: Diagnosis not present

## 2019-06-22 NOTE — Progress Notes (Signed)
Needs f/u appt some time next week.  Thanks.

## 2019-06-29 ENCOUNTER — Telehealth: Payer: Self-pay | Admitting: Orthopedic Surgery

## 2019-06-29 NOTE — Telephone Encounter (Signed)
Ms. Greving is scheduled for a follow up MRI appointment tomorrow 06/30/2019.  She states that she has been exposed to [place] 19 and does not want to risk our office by coming in for that appointment. She would like to know if Dr. Erlinda Hong could call her with the results of her MRI instead.

## 2019-06-30 ENCOUNTER — Ambulatory Visit: Payer: Medicare Other | Admitting: Orthopaedic Surgery

## 2019-06-30 ENCOUNTER — Telehealth: Payer: Self-pay | Admitting: Orthopaedic Surgery

## 2019-06-30 NOTE — Telephone Encounter (Signed)
Patient requested for Dr. Erlinda Hong to call and review MRI results over the phone due to exposure of Covid-19. Patient states this will be best for her. Patient phone number is [phone].

## 2019-06-30 NOTE — Telephone Encounter (Signed)
Spoke to patient.  She will call to try a shot.

## 2019-07-01 NOTE — Telephone Encounter (Signed)
I'll give her the injection

## 2019-07-01 NOTE — Telephone Encounter (Signed)
Duplicate message was sent to you.  Did you need me to put an order for inj or will you be giving this to her?

## 2019-07-01 NOTE — Telephone Encounter (Signed)
Make appt for patient.

## 2019-07-06 ENCOUNTER — Telehealth: Payer: Self-pay

## 2019-07-17 ENCOUNTER — Other Ambulatory Visit: Payer: Self-pay | Admitting: Internal Medicine

## 2019-07-17 DIAGNOSIS — Z1231 Encounter for screening mammogram for malignant neoplasm of breast: Secondary | ICD-10-CM

## 2019-08-19 DIAGNOSIS — H524 Presbyopia: Secondary | ICD-10-CM | POA: Diagnosis not present

## 2019-08-27 ENCOUNTER — Ambulatory Visit: Payer: Medicare Other

## 2019-08-27 DIAGNOSIS — M810 Age-related osteoporosis without current pathological fracture: Secondary | ICD-10-CM | POA: Diagnosis not present

## 2019-08-31 ENCOUNTER — Other Ambulatory Visit: Payer: Self-pay

## 2019-08-31 ENCOUNTER — Ambulatory Visit
Admission: RE | Admit: 2019-08-31 | Discharge: 2019-08-31 | Disposition: A | Discharge status: Home / Self Care | Payer: Medicare Other | Source: Ambulatory Visit | Attending: Internal Medicine | Admitting: Internal Medicine

## 2019-08-31 DIAGNOSIS — Z1231 Encounter for screening mammogram for malignant neoplasm of breast: Secondary | ICD-10-CM

## 2019-09-04 ENCOUNTER — Ambulatory Visit (INDEPENDENT_AMBULATORY_CARE_PROVIDER_SITE_OTHER)
Admission: RE | Admit: 2019-09-04 | Discharge: 2019-09-04 | Disposition: A | Discharge status: Home / Self Care | Payer: Medicare Other | Source: Ambulatory Visit | Attending: Pulmonary Disease | Admitting: Pulmonary Disease

## 2019-09-04 ENCOUNTER — Other Ambulatory Visit: Payer: Self-pay

## 2019-09-04 DIAGNOSIS — R918 Other nonspecific abnormal finding of lung field: Secondary | ICD-10-CM

## 2019-09-07 ENCOUNTER — Telehealth: Payer: Self-pay | Admitting: Pulmonary Disease

## 2019-09-07 ENCOUNTER — Telehealth: Payer: Self-pay | Admitting: *Deleted

## 2019-09-07 DIAGNOSIS — R911 Solitary pulmonary nodule: Secondary | ICD-10-CM

## 2019-09-07 NOTE — Telephone Encounter (Signed)
Patient returned phone call She is scheduled for PFT 09/11/19, covid testing scheduled for 09/08/19.

## 2019-09-07 NOTE — Progress Notes (Signed)
I contacted patient regarding persistent RML lung mass. We discussed diagnostic testing including bronchoscopy to determine infection vs malignancy. Patient agreed to pursue testing. I will send message to Dr. Valeta Harms to arrange for bronchoscopy. We will need PFTs and PET scan prior to procedure

## 2019-09-07 NOTE — Telephone Encounter (Signed)
Sent message to Dr. Valeta Harms and Ashley Akin, RN to coordinate further testing including PET scan, INPATIENT PFTs and bronchoscopy.

## 2019-09-07 NOTE — Progress Notes (Signed)
Lauren, can you please coordinate/order the following tests for this patient. She will need INPATIENT PFTs and PET scan skull to thigh as soon as possible to prepare for a navigational bronchoscopy with Dr. Valeta Harms. Please let us know what dates so we can schedule the bronchoscopy.  Thank you, JE

## 2019-09-07 NOTE — Telephone Encounter (Signed)
-----   Message from Garner Nash, DO sent at 09/07/2019  2:30 PM EDT ----- Regarding: Possible NAV/VATS combo [place],   This is a patient of [facility] I reviewed the images today. She is a non-smoker with FMHx of non-smoking related lung CA. Enlarging nodule they have been following.   Plan: PET scan pending PFTs pending  - both ASAP please   If pet avid should be a good candidate for resection and dual case.   Thanks  Leory Plowman

## 2019-09-07 NOTE — Telephone Encounter (Signed)
Spoke with Diane and Radiology, she wanted to make sure received the CT results. I will forward to Dr. Loanne Drilling.    IMPRESSION: 1. Consolidated pulmonary mass in the right middle lobe contiguous with the major and minor fissure marginally increased in greatest dimensions, but with more volume/bulky appearance. An infection or malignant etiology are differential considerations. Histopathological characterization recommended. 2. Stable other 5 mm bilateral pulmonary nodules and bilateral apical pleural thickening since July 2020. 3. Aortic atherosclerosis. Paucity of coronary calcification. 4. 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 5. Diffuse bone demineralization. Correlation with DEXA may be useful.  Aortic Atherosclerosis (ICD10-I70.0).

## 2019-09-07 NOTE — Telephone Encounter (Signed)
ATC patient unable to reach, left message to call office back. Patient can be scheduled for PFT at hospital on Friday 09/11/19, still needs PET scan and needs covid testing

## 2019-09-08 ENCOUNTER — Other Ambulatory Visit (HOSPITAL_COMMUNITY)
Admission: RE | Admit: 2019-09-08 | Discharge: 2019-09-08 | Disposition: A | Discharge status: Home / Self Care | Payer: Medicare Other | Source: Ambulatory Visit | Attending: Pulmonary Disease | Admitting: Pulmonary Disease

## 2019-09-08 DIAGNOSIS — Z20822 Contact with and (suspected) exposure to covid-19: Secondary | ICD-10-CM | POA: Diagnosis not present

## 2019-09-08 DIAGNOSIS — Z01812 Encounter for preprocedural laboratory examination: Secondary | ICD-10-CM | POA: Diagnosis not present

## 2019-09-08 LAB — SARS CORONAVIRUS 2 (TAT 6-24 HRS): SARS Coronavirus 2: NEGATIVE

## 2019-09-08 NOTE — Telephone Encounter (Signed)
Patient is scheduled for follow up with JE  Nothing further needed at this time.

## 2019-09-08 NOTE — Telephone Encounter (Signed)
Please schedule patient with me on 10/05/19 for follow-up visit to discuss bronchoscopy/surgery results.

## 2019-09-08 NOTE — Telephone Encounter (Signed)
ATC patient, unable to reach left message per DPR to call and make appt with JE on 10/05/19.

## 2019-09-11 ENCOUNTER — Other Ambulatory Visit: Payer: Self-pay

## 2019-09-11 ENCOUNTER — Ambulatory Visit (HOSPITAL_COMMUNITY)
Admission: RE | Admit: 2019-09-11 | Discharge: 2019-09-11 | Disposition: A | Discharge status: Home / Self Care | Payer: Medicare Other | Source: Ambulatory Visit | Attending: Pulmonary Disease | Admitting: Pulmonary Disease

## 2019-09-11 DIAGNOSIS — R942 Abnormal results of pulmonary function studies: Secondary | ICD-10-CM | POA: Insufficient documentation

## 2019-09-11 DIAGNOSIS — R911 Solitary pulmonary nodule: Secondary | ICD-10-CM

## 2019-09-11 LAB — PULMONARY FUNCTION TEST
DL/VA % pred: 73 %
DL/VA: 3.03 ml/min/mmHg/L
DLCO unc % pred: 77 %
DLCO unc: 14.32 ml/min/mmHg
FEF 25-75 Post: 2.37 L/sec
FEF 25-75 Pre: 2.5 L/sec
FEF2575-%Change-Post: -5 %
FEF2575-%Pred-Post: 148 %
FEF2575-%Pred-Pre: 156 %
FEV1-%Change-Post: -2 %
FEV1-%Pred-Post: 127 %
FEV1-%Pred-Pre: 130 %
FEV1-Post: 2.56 L
FEV1-Pre: 2.62 L
FEV1FVC-%Change-Post: 0 %
FEV1FVC-%Pred-Pre: 106 %
FEV6-%Change-Post: -2 %
FEV6-%Pred-Post: 125 %
FEV6-%Pred-Pre: 129 %
FEV6-Post: 3.2 L
FEV6-Pre: 3.3 L
FEV6FVC-%Pred-Post: 105 %
FEV6FVC-%Pred-Pre: 105 %
FVC-%Change-Post: -2 %
FVC-%Pred-Post: 119 %
FVC-%Pred-Pre: 122 %
FVC-Post: 3.2 L
FVC-Pre: 3.3 L
Post FEV1/FVC ratio: 80 %
Post FEV6/FVC ratio: 100 %
Pre FEV1/FVC ratio: 79 %
Pre FEV6/FVC Ratio: 100 %
RV % pred: 93 %
RV: 2.09 L
TLC % pred: 110 %
TLC: 5.44 L

## 2019-09-11 MED ORDER — ALBUTEROL SULFATE (2.5 MG/3ML) 0.083% IN NEBU
2.5000 mg | INHALATION_SOLUTION | Freq: Once | RESPIRATORY_TRACT | Status: AC
  Administered 2019-09-11: 2.5 mg via RESPIRATORY_TRACT

## 2019-09-15 ENCOUNTER — Other Ambulatory Visit: Payer: Self-pay

## 2019-09-15 ENCOUNTER — Encounter (HOSPITAL_COMMUNITY)
Admission: RE | Admit: 2019-09-15 | Discharge: 2019-09-15 | Disposition: A | Discharge status: Home / Self Care | Payer: Medicare Other | Source: Ambulatory Visit | Attending: Pulmonary Disease | Admitting: Pulmonary Disease

## 2019-09-15 DIAGNOSIS — R911 Solitary pulmonary nodule: Secondary | ICD-10-CM | POA: Diagnosis not present

## 2019-09-15 LAB — GLUCOSE, CAPILLARY: Glucose-Capillary: 78 mg/dL (ref 70–99)

## 2019-09-15 MED ORDER — FLUDEOXYGLUCOSE F - 18 (FDG) INJECTION
6.1000 | Freq: Once | INTRAVENOUS | Status: AC | PRN
  Administered 2019-09-15: 6.1 via INTRAVENOUS

## 2019-09-16 ENCOUNTER — Telehealth: Payer: Self-pay | Admitting: Pulmonary Disease

## 2019-09-16 NOTE — Telephone Encounter (Addendum)
Called and spoke to pt. Per pt's chart, pt was called to relay results of the chest CT, pt has already received these results. Pt has also already had her PET, PFT, and is scheduled to see thoracic on 3/26 and has an OV with JE on 10/05/19. Nothing further needed at this time.

## 2019-09-17 NOTE — Progress Notes (Signed)
[address]       [place] [address]             [phone]                    Joan Renlee Tinsley [facility] Medical Record [id number] Date of Birth: July 10, 1945  Referring: Crist Infante, MD Primary Care: Crist Infante, MD Primary Cardiologist: No primary care provider on file.  Chief Complaint:   No chief complaint on file.   History of Present Illness:    Tobe Stanberry 74 y.o. female presents for surgical evaluation of a 2.9 cm right middle lobe pulmonary nodule with increased avidity, and another separate area of avidity within the middle lobe as well.  She has a very remote smoking history that she experimented with cigarettes in her teens but has been tobacco free for most of her life.  She has been exposed to some cigar smoke from her husband but this also has been very rare.  She has been asymptomatic pulmonary standpoint and also denies any neurologic symptoms.  Her weight has been very stable and she remains very active and that she walks 30 minutes 2-3 times a week.    Smoking Hx: Remote smoking history   Zubrod Score: At the time of surgery this patient's most appropriate activity status/level should be described as: [x]     0    Normal activity, no symptoms []     1    Restricted in physical strenuous activity but ambulatory, able to do out light work []     2    Ambulatory and capable of self care, unable to do work activities, up and about               >50 % of waking hours                              []     3    Only limited self care, in bed greater than 50% of waking hours []     4    Completely disabled, no self care, confined to bed or chair []     5    Moribund   Past Medical History:  Diagnosis Date  . Acid reflux    just occasionally  . Arthritis    at the base of my spine  . Gastritis   . Hyperlipidemia   . Hypertension   . Pancreatitis     Past Surgical History:  Procedure Laterality Date  . BREAST BIOPSY  Left 2000  . fractured tibia repair    . RHINOPLASTY      Family History  Problem Relation Age of Onset  . Breast cancer Sister 41  . Migraines Sister   . Hernia Mother   . Migraines Mother   . Ulcers Mother   . Heart failure Father   . Ulcers Father   . Obesity Sister      Social History   Tobacco Use  Smoking Status Never Smoker  Smokeless Tobacco Never Used    Social History   Substance and Sexual Activity  Alcohol Use Not Currently     Allergies  Allergen Reactions  . Pravachol [Pravastatin] Other (See Comments)    Thought it affected her memory and thinking  . Prednisone Other (See Comments)    Pt. Stated "it made me crazy"    Current Outpatient Medications  Medication Sig Dispense Refill  . Apoaequorin (PREVAGEN PO) Take by mouth daily.    . cholecalciferol (VITAMIN D) 1000 UNITS tablet Take 1,000 Units by mouth daily.    Marland Kitchen co-enzyme Q-10 50 MG capsule Take 50 mg by mouth daily.    Marland Kitchen esomeprazole (NEXIUM) 40 MG capsule Take 1 capsule by mouth daily.    Marland Kitchen MILK THISTLE PO Take by mouth daily.    . Multiple Vitamins-Minerals (HAIR SKIN & NAILS ADVANCED PO) Take by mouth daily.    Marland Kitchen OVER THE COUNTER MEDICATION Take 1 capsule by mouth daily. MEGA RED    . rosuvastatin (CRESTOR) 20 MG tablet Take 1 tablet (20 mg total) by mouth at bedtime. 90 tablet 3  . sucralfate (CARAFATE) 1 g tablet Take 1 tablet by mouth 3 (three) times daily.    . valsartan (DIOVAN) 80 MG tablet Take 80 mg by mouth daily.     No current facility-administered medications for this visit.    Review of Systems  Constitutional: Negative.   HENT: Negative.   Respiratory: Negative.   Cardiovascular: Negative.   Gastrointestinal: Positive for abdominal pain and heartburn.  Musculoskeletal: Negative.   Skin: Negative.   Neurological: Negative.      PHYSICAL EXAMINATION: There were no vitals taken for this visit. Physical Exam  Constitutional: She is oriented to person, place, and  time. She appears well-developed and well-nourished. No distress.  HENT:  Head: Normocephalic and atraumatic.  Mouth/Throat: No oropharyngeal exudate.  Eyes: Conjunctivae and EOM are normal.  Neck: No tracheal deviation present.  Cardiovascular: Normal rate, regular rhythm and normal heart sounds.  No murmur heard. Respiratory: Effort normal and breath sounds normal. No respiratory distress.  GI: Soft. She exhibits no distension.  Musculoskeletal:        General: Normal range of motion.     Cervical back: Normal range of motion.  Neurological: She is alert and oriented to person, place, and time.  Skin: Skin is warm and dry. She is not diaphoretic.    Diagnostic Studies & Laboratory data:     Recent Radiology Findings:   CT Chest Wo Contrast  Result Date: 09/04/2019 CLINICAL DATA:  Right lower lobe pulmonary nodule. Right middle lobe infiltrate. No cancer history. EXAM: CT CHEST WITHOUT CONTRAST TECHNIQUE: Multidetector CT imaging of the chest was performed following the standard protocol without IV contrast. COMPARISON:  March 05, 2019 in January 09, 2019 FINDINGS: Cardiovascular: Normal heart size. Paucity of coronary calcification. No pericardial effusion. Aortic calcified atherosclerosis without a thoracic aortic aneurysm. Mild bone demineralization. Multilevel degenerative endplate changes of the spine. A benign bone island in the T5 vertebral body. Mediastinum/Nodes: Benign-appearing noncalcified small lymph nodes. Lungs/Pleura: Bilateral apical pleural thickening. A couple 5 mm right pulmonary nodules, subsolid in the upper lobe on series 3, image 73; mixed density in the right base on image 55; and coarsely calcified in the right base on image 90. Two clustered sub solid micro nodules in the right base on image 138. A 5 mm mixed left basilar nodule is present on image 76. These findings are not significantly changed compared to the prior and July 2020 study. There is no pleural  effusion. Mass-like spiculated consolidation in the right middle lobe contiguous with the major and minor fissure measures 3.0 by 2.5 by 0.9 cm in maximum dimensions. Previously it measured 2.6 by 2.4 by 0.7 cm in maximum dimension with less bulky, more streaky appearance. There are residual tree-in-bud opacities in the right base. Upper Abdomen:  Aortic calcified atherosclerosis. Musculoskeletal: Multilevel spinal mild degenerative endplate changes. A benign T5 bone island. Diffuse bone demineralization. A non-STAT PRA not was generated at the time of this dictation. IMPRESSION: 1. Consolidated pulmonary mass in the right middle lobe contiguous with the major and minor fissure marginally increased in greatest dimensions, but with more volume/bulky appearance. An infection or malignant etiology are differential considerations. Histopathological characterization recommended. 2. Stable other 5 mm bilateral pulmonary nodules and bilateral apical pleural thickening since July 2020. 3. Aortic atherosclerosis. Paucity of coronary calcification. 4. 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 5. Diffuse bone demineralization. Correlation with DEXA may be useful. Aortic Atherosclerosis (ICD10-I70.0). Electronically Signed   By: Revonda Humphrey   On: 09/04/2019 23:14   NM PET Image Initial (PI) Skull Base To Thigh  Result Date: 09/15/2019 CLINICAL DATA:  Initial treatment strategy for enlarging right middle lobe pulmonary nodule. EXAM: NUCLEAR MEDICINE PET SKULL BASE TO THIGH TECHNIQUE: 6.1 mCi F-18 FDG was injected intravenously. Full-ring PET imaging was performed from the skull base to thigh after the radiotracer. CT data was obtained and used for attenuation correction and anatomic localization. Fasting blood glucose: 78 mg/dl COMPARISON:  09/04/2019 chest CT. FINDINGS: Mediastinal blood pool activity: SUV max 2.5 Liver activity: SUV max NA NECK: No hypermetabolic  lymph nodes in the neck. Incidental CT findings: none CHEST: No hypermetabolic axillary, mediastinal or hilar lymph nodes. Hypermetabolic irregular peripheral posterior right middle lobe 2.9 x 2.2 cm pulmonary nodule with max SUV 7.9 (series 8/image 42), abutting and distorting the major fissure, stable since 09/04/2019 chest CT, increased from 2.5 x 2.0 cm on 03/05/2019 chest CT. Mild hypermetabolism associated with patchy chronic tree-in-bud opacities in the anterior right middle lobe with max SUV 2.9 (series 8/image 49), not substantially changed since 03/05/2019 chest CT. No additional hypermetabolic pulmonary findings. A few additional scattered subcentimeter solid pulmonary nodules and patchy mild regions of tree-in-bud opacity in both lungs, largest 0.9 cm in the left lower lobe (series 8/image 45), without significant metabolic activity. Incidental CT findings: Atherosclerotic nonaneurysmal thoracic aorta. ABDOMEN/PELVIS: No abnormal hypermetabolic activity within the liver, pancreas, adrenal glands, or spleen. No hypermetabolic lymph nodes in the abdomen or pelvis. Incidental CT findings: Apparent gas within the nondistended gallbladder (series 4/image 114). Atherosclerotic nonaneurysmal abdominal aorta. Mild sigmoid diverticulosis. SKELETON: No focal hypermetabolic activity to suggest skeletal metastasis. Incidental CT findings: none IMPRESSION: 1. Hypermetabolic (max SUV 7.9) irregular 2.9 cm posterior right middle lobe pulmonary nodule abutting and distorting the right major fissure, increased in size since 03/05/2019 chest CT. Primary bronchogenic carcinoma to be excluded, although differential includes progressive chronic infection such as due to atypical mycobacterial infection (MAI) given mild patchy chronic tree-in-bud opacities in both lungs. Tissue sampling suggested. 2. No hypermetabolic thoracic adenopathy or distant metastatic disease. 3. Apparent gas within the nondistended gallbladder, poorly  evaluated on these motion degraded noncontrast CT images, potentially artifactual. Consider dedicated CT abdomen/pelvis with oral and IV contrast for further evaluation, if clinically warranted. 4.  Aortic Atherosclerosis (ICD10-I70.0). Electronically Signed   By: Ilona Sorrel M.D.   On: 09/15/2019 15:27   MM 3D SCREEN BREAST BILATERAL  Result Date: 09/01/2019 CLINICAL DATA:  Screening. EXAM: DIGITAL SCREENING BILATERAL MAMMOGRAM WITH TOMO AND CAD COMPARISON:  Previous exam(s). ACR Breast Density Category b: There are scattered areas of fibroglandular density. FINDINGS: There are no findings suspicious for malignancy. Images were processed with CAD. IMPRESSION: No mammographic evidence of malignancy. A result letter  of this screening mammogram will be mailed directly to the patient. RECOMMENDATION: Screening mammogram in one year. (Code:[id number]) BI-RADS CATEGORY  1: Negative. Electronically Signed   By: Abelardo Diesel M.D.   On: 09/01/2019 10:58       I have independently reviewed the above radiology studies  and reviewed the findings with the patient.   Recent Lab Findings: Lab Results  Component Value Date   CHOL 181 03/20/2019   TRIG 114 03/20/2019   HDL 82 03/20/2019   LDLCALC 79 03/20/2019      PFTs: - FVC: 122% - FEV1: 130% -DLCO: 77%  Problem List: 2.9 cm right middle lobe pulmonary nodule with SUV of 7.9 Hypermetabolism in the anterior right middle lobe with SUV of 2.9 Multiple subcentimeter pulmonary nodules in both lungs.  No significant metabolic uptake   Assessment / Plan:   74 year old female with a right middle lobe pulmonary nodule with increased SUV uptake, and increased hypermetabolism in the right middle lobe.  I personally reviewed her images and based off my read this nodule is quite close to both the major minor fissure.  Surgical biopsy would be difficult without removing the entire lobe.  We discussed several other options which included a CT-guided biopsy  versus navigational bronchoscopy but I think that given the appearance of these nodules and negative result would unconvincing.  I explained that there is a small chance that this may be an infection but she would like to be aggressive resection with inclusion of cancer.  Based on her lung function and exercise tolerance, I feel comfortable proceeding with a bronchoscopy, robotic assisted right thoracoscopy, right middle lobectomy.  Again both her and her husband understand for definitive biopsy lobectomy required, given that it is agreeable with this plan.       I  spent 40 minutes with  the patient face to face and greater then 50% of the time was spent in counseling and coordination of care.    Lajuana Matte 09/17/2019 12:07 PM

## 2019-09-18 ENCOUNTER — Other Ambulatory Visit: Payer: Self-pay

## 2019-09-18 ENCOUNTER — Other Ambulatory Visit: Payer: Self-pay | Admitting: *Deleted

## 2019-09-18 ENCOUNTER — Institutional Professional Consult (permissible substitution): Payer: Medicare Other | Admitting: Thoracic Surgery (Cardiothoracic Vascular Surgery)

## 2019-09-18 ENCOUNTER — Encounter: Payer: Self-pay | Admitting: Thoracic Surgery (Cardiothoracic Vascular Surgery)

## 2019-09-18 ENCOUNTER — Encounter: Payer: Self-pay | Admitting: *Deleted

## 2019-09-18 VITALS — BP 141/87 | HR 87 | Temp 98.9°F | Resp 20 | Ht 63.0 in | Wt 125.0 lb

## 2019-09-18 DIAGNOSIS — R911 Solitary pulmonary nodule: Secondary | ICD-10-CM

## 2019-09-21 DIAGNOSIS — H6120 Impacted cerumen, unspecified ear: Secondary | ICD-10-CM | POA: Insufficient documentation

## 2019-09-21 DIAGNOSIS — H9319 Tinnitus, unspecified ear: Secondary | ICD-10-CM | POA: Insufficient documentation

## 2019-09-21 NOTE — Progress Notes (Signed)
Patient results have been reviewed. Bronch is 09/28/2019 PFT 09/11/2019 PET 09/15/2019

## 2019-09-23 NOTE — Progress Notes (Signed)
[facility] ([address] [place] [id number] [place] ([place]) [place] [address] Phone: [phone] Fax: [phone]      Your procedure is scheduled on Monday April 5  Report to [facility] Main Entrance "A" at 0530 A.M., and check in at the Admitting office.  Call this number if you have problems the morning of surgery:  [phone]  Call [phone] if you have any questions prior to your surgery date Monday-Friday 8am-4pm    Remember:  Do not eat or drink after midnight the night before your surgery    Take these medicines the morning of surgery with A SIP OF WATER  esomeprazole ([place])   As of today, STOP taking any Aspirin (unless otherwise instructed by your surgeon) and Aspirin containing products, Aleve, Naproxen, Ibuprofen, Motrin, Advil, Goody's, BC's, all herbal medications, fish oil, and all vitamins.                      Do not wear jewelry, make up, or nail polish            Do not wear lotions, powders, perfumes/colognes, or deodorant.            Do not shave 48 hours prior to surgery.             Do not bring valuables to the hospital.            [facility] is not responsible for any belongings or valuables.  Do NOT Smoke (Tobacco/Vapping) or drink Alcohol 24 hours prior to your procedure If you use a CPAP at night, you may bring all equipment for your overnight stay.   Contacts, glasses, dentures or bridgework may not be worn into surgery.      For patients admitted to the hospital, discharge time will be determined by your treatment team.   Patients discharged the day of surgery will not be allowed to drive home, and someone needs to stay with them for 24 hours.    Special instructions:   [facility]- Preparing For Surgery  Before surgery, you can play an important role. Because skin is not sterile, your skin needs to be as free of germs as possible. You can reduce the number of germs on your skin by washing with  CHG (chlorahexidine gluconate) Soap before surgery.  CHG is an antiseptic cleaner which kills germs and bonds with the skin to continue killing germs even after washing.    Oral Hygiene is also important to reduce your risk of infection.  Remember - BRUSH YOUR TEETH THE MORNING OF SURGERY WITH YOUR REGULAR TOOTHPASTE  Please do not use if you have an allergy to CHG or antibacterial soaps. If your skin becomes reddened/irritated stop using the CHG.  Do not shave (including legs and underarms) for at least 48 hours prior to first CHG shower. It is OK to shave your face.  Please follow these instructions carefully.   1. Shower the NIGHT BEFORE SURGERY and the MORNING OF SURGERY with CHG Soap.   2. If you chose to wash your hair, wash your hair first as usual with your normal shampoo.  3. After you shampoo, rinse your hair and body thoroughly to remove the shampoo.  4. Use CHG as you would any other liquid soap. You can apply CHG directly to the skin and wash gently with a scrungie or a clean washcloth.   5. Apply the CHG Soap to your body  ONLY FROM THE NECK DOWN.  Do not use on open wounds or open sores. Avoid contact with your eyes, ears, mouth and genitals (private parts). Wash Face and genitals (private parts)  with your normal soap.   6. Wash thoroughly, paying special attention to the area where your surgery will be performed.  7. Thoroughly rinse your body with warm water from the neck down.  8. DO NOT shower/wash with your normal soap after using and rinsing off the CHG Soap.  9. Pat yourself dry with a CLEAN TOWEL.  10. Wear CLEAN PAJAMAS to bed the night before surgery, wear comfortable clothes the morning of surgery  11. Place CLEAN SHEETS on your bed the night of your first shower and DO NOT SLEEP WITH PETS.   Day of Surgery:   Do not apply any deodorants/lotions.  Please wear clean clothes to the hospital/surgery center.   Remember to brush your teeth WITH YOUR REGULAR  TOOTHPASTE.   Please read over the following fact sheets that you were given.

## 2019-09-24 ENCOUNTER — Telehealth: Payer: Self-pay | Admitting: Pulmonary Disease

## 2019-09-24 ENCOUNTER — Encounter (HOSPITAL_COMMUNITY)
Admission: RE | Admit: 2019-09-24 | Discharge: 2019-09-24 | Disposition: A | Discharge status: Home / Self Care | Payer: Medicare Other | Source: Ambulatory Visit | Attending: Thoracic Surgery (Cardiothoracic Vascular Surgery) | Admitting: Thoracic Surgery (Cardiothoracic Vascular Surgery)

## 2019-09-24 ENCOUNTER — Other Ambulatory Visit: Payer: Self-pay

## 2019-09-24 ENCOUNTER — Encounter (HOSPITAL_COMMUNITY): Payer: Self-pay

## 2019-09-24 ENCOUNTER — Other Ambulatory Visit (HOSPITAL_COMMUNITY)
Admission: RE | Admit: 2019-09-24 | Discharge: 2019-09-24 | Disposition: A | Discharge status: Home / Self Care | Payer: Medicare Other | Source: Ambulatory Visit | Attending: Thoracic Surgery (Cardiothoracic Vascular Surgery) | Admitting: Thoracic Surgery (Cardiothoracic Vascular Surgery)

## 2019-09-24 DIAGNOSIS — E785 Hyperlipidemia, unspecified: Secondary | ICD-10-CM | POA: Diagnosis not present

## 2019-09-24 DIAGNOSIS — Z01818 Encounter for other preprocedural examination: Secondary | ICD-10-CM | POA: Insufficient documentation

## 2019-09-24 DIAGNOSIS — R918 Other nonspecific abnormal finding of lung field: Secondary | ICD-10-CM | POA: Insufficient documentation

## 2019-09-24 DIAGNOSIS — K219 Gastro-esophageal reflux disease without esophagitis: Secondary | ICD-10-CM | POA: Diagnosis not present

## 2019-09-24 DIAGNOSIS — Z20822 Contact with and (suspected) exposure to covid-19: Secondary | ICD-10-CM | POA: Insufficient documentation

## 2019-09-24 DIAGNOSIS — H9313 Tinnitus, bilateral: Secondary | ICD-10-CM | POA: Diagnosis not present

## 2019-09-24 DIAGNOSIS — R911 Solitary pulmonary nodule: Secondary | ICD-10-CM

## 2019-09-24 DIAGNOSIS — Z79899 Other long term (current) drug therapy: Secondary | ICD-10-CM | POA: Diagnosis not present

## 2019-09-24 DIAGNOSIS — I10 Essential (primary) hypertension: Secondary | ICD-10-CM | POA: Diagnosis not present

## 2019-09-24 LAB — SARS CORONAVIRUS 2 (TAT 6-24 HRS): SARS Coronavirus 2: NEGATIVE

## 2019-09-24 LAB — URINALYSIS, ROUTINE W REFLEX MICROSCOPIC
Bacteria, UA: NONE SEEN
Bilirubin Urine: NEGATIVE
Glucose, UA: NEGATIVE mg/dL
Ketones, ur: NEGATIVE mg/dL
Leukocytes,Ua: NEGATIVE
Nitrite: NEGATIVE
Protein, ur: NEGATIVE mg/dL
Specific Gravity, Urine: 1.016 (ref 1.005–1.030)
pH: 5 (ref 5.0–8.0)

## 2019-09-24 LAB — TYPE AND SCREEN
ABO/RH(D): A NEG
Antibody Screen: NEGATIVE

## 2019-09-24 LAB — BLOOD GAS, ARTERIAL
Acid-base deficit: 0.9 mmol/L (ref 0.0–2.0)
Bicarbonate: 23.1 mmol/L (ref 20.0–28.0)
FIO2: 21
O2 Saturation: 97.6 %
Patient temperature: 37
pCO2 arterial: 36.7 mmHg (ref 32.0–48.0)
pH, Arterial: 7.415 (ref 7.350–7.450)
pO2, Arterial: 113 mmHg — ABNORMAL HIGH (ref 83.0–108.0)

## 2019-09-24 LAB — COMPREHENSIVE METABOLIC PANEL
ALT: 17 U/L (ref 0–44)
AST: 25 U/L (ref 15–41)
Albumin: 3.9 g/dL (ref 3.5–5.0)
Alkaline Phosphatase: 44 U/L (ref 38–126)
Anion gap: 10 (ref 5–15)
BUN: 13 mg/dL (ref 8–23)
CO2: 22 mmol/L (ref 22–32)
Calcium: 9.3 mg/dL (ref 8.9–10.3)
Chloride: 108 mmol/L (ref 98–111)
Creatinine, Ser: 0.82 mg/dL (ref 0.44–1.00)
GFR calc Af Amer: >60 mL/min (ref >60)
GFR calc non Af Amer: >60 mL/min (ref >60)
Glucose, Bld: 86 mg/dL (ref 70–99)
Potassium: 4.4 mmol/L (ref 3.5–5.1)
Sodium: 140 mmol/L (ref 135–145)
Total Bilirubin: 0.4 mg/dL (ref 0.3–1.2)
Total Protein: 7.3 g/dL (ref 6.5–8.1)

## 2019-09-24 LAB — CBC
HCT: 40.9 % (ref 36.0–46.0)
Hemoglobin: 13.1 g/dL (ref 12.0–15.0)
MCH: 29 pg (ref 26.0–34.0)
MCHC: 32 g/dL (ref 30.0–36.0)
MCV: 90.7 fL (ref 80.0–100.0)
Platelets: 306 10*3/uL (ref 150–400)
RBC: 4.51 MIL/uL (ref 3.87–5.11)
RDW: 12.7 % (ref 11.5–15.5)
WBC: 6.3 10*3/uL (ref 4.0–10.5)
nRBC: 0 % (ref 0.0–0.2)

## 2019-09-24 LAB — SURGICAL PCR SCREEN
MRSA, PCR: NEGATIVE
Staphylococcus aureus: NEGATIVE

## 2019-09-24 LAB — ABO/RH: ABO/RH(D): A NEG

## 2019-09-24 LAB — PROTIME-INR
INR: 1 (ref 0.8–1.2)
Prothrombin Time: 13.2 seconds (ref 11.4–15.2)

## 2019-09-24 LAB — APTT: aPTT: 30 seconds (ref 24–36)

## 2019-09-24 NOTE — Telephone Encounter (Signed)
ATC Patient.  LM to call back. 

## 2019-09-24 NOTE — Progress Notes (Signed)
PCP - Elta Guadeloupe [facility] Cardiologist - [place] Pulmonary: Sadie Haber- pt. didn't remember his name   Chest x-ray - DOS EKG - 09/24/19 Stress Test - 6/20 ECHO - na Cardiac Cath - na  Sleep Study - na  Blood Thinner Instructions:  na Aspirin Instructions:   COVID TEST- 09/24/19   Anesthesia review:   Patient denies shortness of breath, fever, cough and chest pain at PAT appointment   All instructions explained to the patient, with a verbal understanding of the material. Patient agrees to go over the instructions while at home for a better understanding. Patient also instructed to self quarantine after being tested for COVID-19. The opportunity to ask questions was provided.

## 2019-09-27 NOTE — Anesthesia Preprocedure Evaluation (Addendum)
Anesthesia Evaluation  Patient identified by MRN, date of birth, ID band Patient awake    Reviewed: Allergy & Precautions, NPO status , Patient's Chart, lab work & pertinent test results  Airway Mallampati: II  TM Distance: >3 FB Neck ROM: Full    Dental no notable dental hx. (+) Teeth Intact, Dental Advisory Given   Pulmonary neg pulmonary ROS,    Pulmonary exam normal breath sounds clear to auscultation       Cardiovascular hypertension, Pt. on medications Normal cardiovascular exam Rhythm:Regular Rate:Normal  ECG: SR, rate 70  Myocardial perfusion: Stress EKG is non-diagnostic, as this is pharmacological stress test. Myocardial pefusion imaging is normal. Left ventricular ejection fraction is 73% with normal wall motion. Low risk study.   Neuro/Psych negative neurological ROS  negative psych ROS   GI/Hepatic Neg liver ROS, Gastritis   Endo/Other  negative endocrine ROS  Renal/GU negative Renal ROS     Musculoskeletal negative musculoskeletal ROS (+)   Abdominal   Peds  Hematology HLD   Anesthesia Other Findings RML PULMONARY NODULE  Reproductive/Obstetrics                           Anesthesia Physical Anesthesia Plan  ASA: II  Anesthesia Plan: General   Post-op Pain Management:    Induction: Intravenous  PONV Risk Score and Plan: 3 and Ondansetron, Dexamethasone, Midazolam and Treatment may vary due to age or medical condition  Airway Management Planned: Oral ETT and Double Lumen EBT  Additional Equipment: Arterial line  Intra-op Plan:   Post-operative Plan: Extubation in OR  Informed Consent: I have reviewed the patients History and Physical, chart, labs and discussed the procedure including the risks, benefits and alternatives for the proposed anesthesia with the patient or authorized representative who has indicated his/her understanding and acceptance.     Dental  advisory given  Plan Discussed with: CRNA  Anesthesia Plan Comments:        Anesthesia Quick Evaluation

## 2019-09-28 ENCOUNTER — Inpatient Hospital Stay (HOSPITAL_COMMUNITY): Payer: Medicare Other

## 2019-09-28 ENCOUNTER — Inpatient Hospital Stay (HOSPITAL_COMMUNITY): Payer: Medicare Other | Admitting: Anesthesiology

## 2019-09-28 ENCOUNTER — Encounter (HOSPITAL_COMMUNITY)
Admission: RE | Disposition: A | Discharge status: Home / Self Care | Payer: Self-pay | Source: Home / Self Care | Attending: Thoracic Surgery (Cardiothoracic Vascular Surgery)

## 2019-09-28 ENCOUNTER — Other Ambulatory Visit: Payer: Self-pay

## 2019-09-28 ENCOUNTER — Inpatient Hospital Stay (HOSPITAL_COMMUNITY)
Admission: RE | Admit: 2019-09-28 | Discharge: 2019-09-29 | DRG: 164 | Disposition: A | Discharge status: Home / Self Care | Payer: Medicare Other | Attending: Thoracic Surgery (Cardiothoracic Vascular Surgery) | Admitting: Thoracic Surgery (Cardiothoracic Vascular Surgery)

## 2019-09-28 ENCOUNTER — Encounter (HOSPITAL_COMMUNITY): Payer: Self-pay | Admitting: Thoracic Surgery (Cardiothoracic Vascular Surgery)

## 2019-09-28 DIAGNOSIS — Z87891 Personal history of nicotine dependence: Secondary | ICD-10-CM | POA: Diagnosis not present

## 2019-09-28 DIAGNOSIS — R911 Solitary pulmonary nodule: Secondary | ICD-10-CM

## 2019-09-28 DIAGNOSIS — Z09 Encounter for follow-up examination after completed treatment for conditions other than malignant neoplasm: Secondary | ICD-10-CM

## 2019-09-28 DIAGNOSIS — E782 Mixed hyperlipidemia: Secondary | ICD-10-CM | POA: Diagnosis not present

## 2019-09-28 DIAGNOSIS — I10 Essential (primary) hypertension: Secondary | ICD-10-CM | POA: Diagnosis present

## 2019-09-28 DIAGNOSIS — R918 Other nonspecific abnormal finding of lung field: Principal | ICD-10-CM | POA: Diagnosis present

## 2019-09-28 DIAGNOSIS — J984 Other disorders of lung: Secondary | ICD-10-CM | POA: Diagnosis not present

## 2019-09-28 DIAGNOSIS — K219 Gastro-esophageal reflux disease without esophagitis: Secondary | ICD-10-CM | POA: Diagnosis not present

## 2019-09-28 DIAGNOSIS — J9383 Other pneumothorax: Secondary | ICD-10-CM | POA: Diagnosis not present

## 2019-09-28 DIAGNOSIS — Z902 Acquired absence of lung [part of]: Secondary | ICD-10-CM

## 2019-09-28 DIAGNOSIS — Z20822 Contact with and (suspected) exposure to covid-19: Secondary | ICD-10-CM | POA: Diagnosis not present

## 2019-09-28 DIAGNOSIS — J939 Pneumothorax, unspecified: Secondary | ICD-10-CM | POA: Diagnosis not present

## 2019-09-28 DIAGNOSIS — Z803 Family history of malignant neoplasm of breast: Secondary | ICD-10-CM | POA: Diagnosis not present

## 2019-09-28 DIAGNOSIS — E785 Hyperlipidemia, unspecified: Secondary | ICD-10-CM | POA: Diagnosis present

## 2019-09-28 DIAGNOSIS — J9811 Atelectasis: Secondary | ICD-10-CM | POA: Diagnosis not present

## 2019-09-28 DIAGNOSIS — Z8249 Family history of ischemic heart disease and other diseases of the circulatory system: Secondary | ICD-10-CM | POA: Diagnosis not present

## 2019-09-28 DIAGNOSIS — Z01818 Encounter for other preprocedural examination: Secondary | ICD-10-CM | POA: Diagnosis not present

## 2019-09-28 DIAGNOSIS — J852 Abscess of lung without pneumonia: Secondary | ICD-10-CM | POA: Diagnosis present

## 2019-09-28 HISTORY — PX: INTERCOSTAL NERVE BLOCK: SHX5021

## 2019-09-28 HISTORY — PX: NODE DISSECTION: SHX5269

## 2019-09-28 HISTORY — PX: VIDEO BRONCHOSCOPY: SHX5072

## 2019-09-28 SURGERY — BRONCHOSCOPY, VIDEO-ASSISTED
Anesthesia: General | Site: Chest | Laterality: Right

## 2019-09-28 MED ORDER — CEFAZOLIN SODIUM-DEXTROSE 2-4 GM/100ML-% IV SOLN
2.0000 g | Freq: Three times a day (TID) | INTRAVENOUS | Status: AC
  Administered 2019-09-28 – 2019-09-29 (×2): 2 g via INTRAVENOUS
  Filled 2019-09-28 (×2): qty 100

## 2019-09-28 MED ORDER — ROSUVASTATIN CALCIUM 20 MG PO TABS: 20.0000 mg | ORAL_TABLET | Freq: Every day | ORAL | Status: DC

## 2019-09-28 MED ORDER — SODIUM CHLORIDE FLUSH 0.9 % IV SOLN
INTRAVENOUS | Status: DC | PRN
  Administered 2019-09-28: 100 mL

## 2019-09-28 MED ORDER — PHENYLEPHRINE 40 MCG/ML (10ML) SYRINGE FOR IV PUSH (FOR BLOOD PRESSURE SUPPORT)
PREFILLED_SYRINGE | INTRAVENOUS | Status: AC
  Filled 2019-09-28: qty 10

## 2019-09-28 MED ORDER — SODIUM CHLORIDE 0.9 % IR SOLN
Status: DC | PRN
  Administered 2019-09-28: 1000 mL

## 2019-09-28 MED ORDER — PHENYLEPHRINE HCL-NACL 10-0.9 MG/250ML-% IV SOLN
INTRAVENOUS | Status: DC | PRN
  Administered 2019-09-28: 20 ug/min via INTRAVENOUS
  Administered 2019-09-28: 45 ug/min via INTRAVENOUS

## 2019-09-28 MED ORDER — ONDANSETRON HCL 4 MG/2ML IJ SOLN
INTRAMUSCULAR | Status: DC | PRN
  Administered 2019-09-28: 4 mg via INTRAVENOUS

## 2019-09-28 MED ORDER — IRBESARTAN 150 MG PO TABS
75.0000 mg | ORAL_TABLET | Freq: Every day | ORAL | Status: DC
  Administered 2019-09-29: 10:00:00 75 mg via ORAL
  Filled 2019-09-28: qty 1

## 2019-09-28 MED ORDER — LACTATED RINGERS IV SOLN: INTRAVENOUS | Status: DC | PRN

## 2019-09-28 MED ORDER — ACETAMINOPHEN 500 MG PO TABS
1000.0000 mg | ORAL_TABLET | Freq: Once | ORAL | Status: AC
  Administered 2019-09-28: 1000 mg via ORAL
  Filled 2019-09-28: qty 2

## 2019-09-28 MED ORDER — ROCURONIUM BROMIDE 50 MG/5ML IV SOSY
PREFILLED_SYRINGE | INTRAVENOUS | Status: DC | PRN
  Administered 2019-09-28 (×4): 20 mg via INTRAVENOUS
  Administered 2019-09-28: 50 mg via INTRAVENOUS

## 2019-09-28 MED ORDER — FENTANYL CITRATE (PF) 100 MCG/2ML IJ SOLN
50.0000 ug | INTRAMUSCULAR | Status: DC | PRN
  Administered 2019-09-28: 50 ug via INTRAVENOUS
  Filled 2019-09-28: qty 2

## 2019-09-28 MED ORDER — MIDAZOLAM HCL 5 MG/5ML IJ SOLN
INTRAMUSCULAR | Status: DC | PRN
  Administered 2019-09-28: 1 mg via INTRAVENOUS

## 2019-09-28 MED ORDER — LIDOCAINE 2% (20 MG/ML) 5 ML SYRINGE
INTRAMUSCULAR | Status: AC
  Filled 2019-09-28: qty 5

## 2019-09-28 MED ORDER — ROCURONIUM BROMIDE 10 MG/ML (PF) SYRINGE
PREFILLED_SYRINGE | INTRAVENOUS | Status: AC
  Filled 2019-09-28: qty 10

## 2019-09-28 MED ORDER — ACETAMINOPHEN 500 MG PO TABS
1000.0000 mg | ORAL_TABLET | Freq: Four times a day (QID) | ORAL | Status: DC
  Administered 2019-09-28 – 2019-09-29 (×4): 1000 mg via ORAL
  Filled 2019-09-28 (×4): qty 2

## 2019-09-28 MED ORDER — ONDANSETRON HCL 4 MG/2ML IJ SOLN: 4.0000 mg | Freq: Once | INTRAMUSCULAR | Status: DC | PRN

## 2019-09-28 MED ORDER — KETOROLAC TROMETHAMINE 30 MG/ML IJ SOLN
30.0000 mg | Freq: Once | INTRAMUSCULAR | Status: AC
  Administered 2019-09-28: 30 mg via INTRAVENOUS

## 2019-09-28 MED ORDER — PANTOPRAZOLE SODIUM 40 MG PO TBEC
40.0000 mg | DELAYED_RELEASE_TABLET | Freq: Every day | ORAL | Status: DC
  Administered 2019-09-29: 40 mg via ORAL
  Filled 2019-09-28: qty 1

## 2019-09-28 MED ORDER — FENTANYL CITRATE (PF) 100 MCG/2ML IJ SOLN
25.0000 ug | INTRAMUSCULAR | Status: DC | PRN
  Administered 2019-09-28: 50 ug via INTRAVENOUS

## 2019-09-28 MED ORDER — MIDAZOLAM HCL 2 MG/2ML IJ SOLN
INTRAMUSCULAR | Status: AC
  Filled 2019-09-28: qty 2

## 2019-09-28 MED ORDER — KETOROLAC TROMETHAMINE 30 MG/ML IJ SOLN
INTRAMUSCULAR | Status: AC
  Filled 2019-09-28: qty 1

## 2019-09-28 MED ORDER — SUGAMMADEX SODIUM 200 MG/2ML IV SOLN
INTRAVENOUS | Status: DC | PRN
  Administered 2019-09-28: 150 mg via INTRAVENOUS

## 2019-09-28 MED ORDER — SENNOSIDES-DOCUSATE SODIUM 8.6-50 MG PO TABS
1.0000 | ORAL_TABLET | Freq: Every day | ORAL | Status: DC
  Filled 2019-09-28: qty 1

## 2019-09-28 MED ORDER — ACETAMINOPHEN 160 MG/5ML PO SOLN: 1000.0000 mg | Freq: Four times a day (QID) | ORAL | Status: DC

## 2019-09-28 MED ORDER — FENTANYL CITRATE (PF) 250 MCG/5ML IJ SOLN
INTRAMUSCULAR | Status: AC
  Filled 2019-09-28: qty 5

## 2019-09-28 MED ORDER — SUCCINYLCHOLINE CHLORIDE 200 MG/10ML IV SOSY
PREFILLED_SYRINGE | INTRAVENOUS | Status: AC
  Filled 2019-09-28: qty 10

## 2019-09-28 MED ORDER — KETOROLAC TROMETHAMINE 15 MG/ML IJ SOLN
15.0000 mg | Freq: Four times a day (QID) | INTRAMUSCULAR | Status: DC
  Administered 2019-09-28 – 2019-09-29 (×3): 15 mg via INTRAVENOUS
  Filled 2019-09-28 (×4): qty 1

## 2019-09-28 MED ORDER — CEFAZOLIN SODIUM-DEXTROSE 2-4 GM/100ML-% IV SOLN
2.0000 g | INTRAVENOUS | Status: AC
  Administered 2019-09-28: 08:00:00 2 g via INTRAVENOUS
  Filled 2019-09-28: qty 100

## 2019-09-28 MED ORDER — PROPOFOL 10 MG/ML IV BOLUS
INTRAVENOUS | Status: DC | PRN
  Administered 2019-09-28: 100 mg via INTRAVENOUS

## 2019-09-28 MED ORDER — LIDOCAINE HCL (CARDIAC) PF 100 MG/5ML IV SOSY
PREFILLED_SYRINGE | INTRAVENOUS | Status: DC | PRN
  Administered 2019-09-28: 60 mg via INTRAVENOUS

## 2019-09-28 MED ORDER — DEXAMETHASONE SODIUM PHOSPHATE 10 MG/ML IJ SOLN
INTRAMUSCULAR | Status: AC
  Filled 2019-09-28: qty 1

## 2019-09-28 MED ORDER — 0.9 % SODIUM CHLORIDE (POUR BTL) OPTIME
TOPICAL | Status: DC | PRN
  Administered 2019-09-28: 1000 mL

## 2019-09-28 MED ORDER — PROPOFOL 10 MG/ML IV BOLUS
INTRAVENOUS | Status: AC
  Filled 2019-09-28: qty 20

## 2019-09-28 MED ORDER — FENTANYL CITRATE (PF) 100 MCG/2ML IJ SOLN
INTRAMUSCULAR | Status: DC | PRN
  Administered 2019-09-28 (×2): 50 ug via INTRAVENOUS
  Administered 2019-09-28: 100 ug via INTRAVENOUS
  Administered 2019-09-28: 50 ug via INTRAVENOUS

## 2019-09-28 MED ORDER — ONDANSETRON HCL 4 MG/2ML IJ SOLN
4.0000 mg | Freq: Four times a day (QID) | INTRAMUSCULAR | Status: DC | PRN
  Administered 2019-09-28 – 2019-09-29 (×3): 4 mg via INTRAVENOUS
  Filled 2019-09-28 (×4): qty 2

## 2019-09-28 MED ORDER — BISACODYL 5 MG PO TBEC: 10.0000 mg | DELAYED_RELEASE_TABLET | Freq: Every day | ORAL | Status: DC

## 2019-09-28 MED ORDER — BUPIVACAINE LIPOSOME 1.3 % IJ SUSP
20.0000 mL | INTRAMUSCULAR | Status: DC
  Filled 2019-09-28: qty 20

## 2019-09-28 MED ORDER — BUPIVACAINE HCL (PF) 0.5 % IJ SOLN
INTRAMUSCULAR | Status: AC
  Filled 2019-09-28: qty 30

## 2019-09-28 MED ORDER — FENTANYL CITRATE (PF) 100 MCG/2ML IJ SOLN
INTRAMUSCULAR | Status: AC
  Filled 2019-09-28: qty 2

## 2019-09-28 MED ORDER — DEXAMETHASONE SODIUM PHOSPHATE 10 MG/ML IJ SOLN
INTRAMUSCULAR | Status: DC | PRN
  Administered 2019-09-28: 8 mg via INTRAVENOUS

## 2019-09-28 MED ORDER — HEMOSTATIC AGENTS (NO CHARGE) OPTIME
TOPICAL | Status: DC | PRN
  Administered 2019-09-28: 2 via TOPICAL

## 2019-09-28 MED ORDER — TRAMADOL HCL 50 MG PO TABS
50.0000 mg | ORAL_TABLET | Freq: Four times a day (QID) | ORAL | Status: DC | PRN
  Administered 2019-09-28: 100 mg via ORAL
  Filled 2019-09-28: qty 2

## 2019-09-28 MED ORDER — ONDANSETRON HCL 4 MG/2ML IJ SOLN
INTRAMUSCULAR | Status: AC
  Filled 2019-09-28: qty 2

## 2019-09-28 MED ORDER — SODIUM CHLORIDE 0.9 % IV SOLN: INTRAVENOUS | Status: DC | PRN

## 2019-09-28 SURGICAL SUPPLY — 130 items
ADH SKN CLS APL DERMABOND .7 (GAUZE/BANDAGES/DRESSINGS) ×2
APL PRP STRL LF DISP 70% ISPRP (MISCELLANEOUS) ×2
BAG SPEC RTRVL LRG 6X4 10 (ENDOMECHANICALS)
BAG TISS RTRVL C300 12X14 (MISCELLANEOUS) ×2
BLADE CLIPPER SURG (BLADE) ×2 IMPLANT
BLADE SURG 11 STRL SS (BLADE) ×3 IMPLANT
BNDG COHESIVE 6X5 TAN STRL LF (GAUZE/BANDAGES/DRESSINGS) ×3 IMPLANT
CANISTER SUCT 3000ML PPV (MISCELLANEOUS) ×6 IMPLANT
CANNULA REDUC XI 12-8 STAPL (CANNULA) ×6
CANNULA REDUCER 12-8 DVNC XI (CANNULA) ×4 IMPLANT
CATH THORACIC 28FR (CATHETERS) ×1 IMPLANT
CATH THORACIC 28FR RT ANG (CATHETERS) IMPLANT
CATH THORACIC 36FR (CATHETERS) IMPLANT
CATH THORACIC 36FR RT ANG (CATHETERS) IMPLANT
CATH TROCAR 20FR (CATHETERS) IMPLANT
CHLORAPREP W/TINT 26 (MISCELLANEOUS) ×3 IMPLANT
CLIP VESOCCLUDE MED 6/CT (CLIP) IMPLANT
CNTNR URN SCR LID CUP LEK RST (MISCELLANEOUS) ×10 IMPLANT
CONN ST 1/4X3/8  BEN (MISCELLANEOUS)
CONN ST 1/4X3/8 BEN (MISCELLANEOUS) IMPLANT
CONN Y 3/8X3/8X3/8  BEN (MISCELLANEOUS)
CONN Y 3/8X3/8X3/8 BEN (MISCELLANEOUS) IMPLANT
CONT SPEC 4OZ STRL OR WHT (MISCELLANEOUS) ×24
COVER SURGICAL LIGHT HANDLE (MISCELLANEOUS) IMPLANT
DEFOGGER SCOPE WARMER CLEARIFY (MISCELLANEOUS) ×3 IMPLANT
DERMABOND ADVANCED (GAUZE/BANDAGES/DRESSINGS) ×1
DERMABOND ADVANCED .7 DNX12 (GAUZE/BANDAGES/DRESSINGS) ×2 IMPLANT
DISSECTOR BLUNT TIP ENDO 5MM (MISCELLANEOUS) IMPLANT
DRAIN CHANNEL 28F RND 3/8 FF (WOUND CARE) IMPLANT
DRAIN CHANNEL 32F RND 10.7 FF (WOUND CARE) IMPLANT
DRAPE ARM DVNC X/XI (DISPOSABLE) ×8 IMPLANT
DRAPE COLUMN DVNC XI (DISPOSABLE) ×2 IMPLANT
DRAPE CV SPLIT W-CLR ANES SCRN (DRAPES) ×3 IMPLANT
DRAPE DA VINCI XI ARM (DISPOSABLE) ×12
DRAPE DA VINCI XI COLUMN (DISPOSABLE) ×3
DRAPE ORTHO SPLIT 77X108 STRL (DRAPES) ×3
DRAPE SURG ORHT 6 SPLT 77X108 (DRAPES) ×2 IMPLANT
DRAPE WARM FLUID 44X44 (DRAPES) IMPLANT
ELECT BLADE 6.5 EXT (BLADE) IMPLANT
ELECT REM PT RETURN 9FT ADLT (ELECTROSURGICAL) ×3
ELECTRODE REM PT RTRN 9FT ADLT (ELECTROSURGICAL) ×2 IMPLANT
GAUZE KITTNER 4X10 (MISCELLANEOUS) IMPLANT
GAUZE KITTNER 4X8 (MISCELLANEOUS) ×3 IMPLANT
GAUZE SPONGE 4X4 12PLY STRL (GAUZE/BANDAGES/DRESSINGS) ×3 IMPLANT
GAUZE SPONGE 4X4 12PLY STRL LF (GAUZE/BANDAGES/DRESSINGS) ×1 IMPLANT
GLOVE BIO SURGEON STRL SZ 6.5 (GLOVE) ×3 IMPLANT
GLOVE BIO SURGEON STRL SZ7.5 (GLOVE) ×7 IMPLANT
GLOVE BIOGEL PI IND STRL 6 (GLOVE) IMPLANT
GLOVE BIOGEL PI INDICATOR 6 (GLOVE) ×2
GOWN STRL REUS W/ TWL LRG LVL3 (GOWN DISPOSABLE) ×4 IMPLANT
GOWN STRL REUS W/ TWL XL LVL3 (GOWN DISPOSABLE) ×2 IMPLANT
GOWN STRL REUS W/TWL LRG LVL3 (GOWN DISPOSABLE) ×12
GOWN STRL REUS W/TWL XL LVL3 (GOWN DISPOSABLE) ×9
HEMOSTAT SURGICEL 2X14 (HEMOSTASIS) ×9 IMPLANT
IRRIGATION STRYKERFLOW (MISCELLANEOUS) ×2 IMPLANT
IRRIGATOR STRYKERFLOW (MISCELLANEOUS) ×3
KIT BASIN OR (CUSTOM PROCEDURE TRAY) ×3 IMPLANT
KIT SUCTION CATH 14FR (SUCTIONS) IMPLANT
KIT TURNOVER KIT B (KITS) ×3 IMPLANT
LOOP VESSEL SUPERMAXI WHITE (MISCELLANEOUS) IMPLANT
NEEDLE 22X1 1/2 (OR ONLY) (NEEDLE) ×3 IMPLANT
NS IRRIG 1000ML POUR BTL (IV SOLUTION) ×9 IMPLANT
PACK CHEST (CUSTOM PROCEDURE TRAY) ×3 IMPLANT
PAD ARMBOARD 7.5X6 YLW CONV (MISCELLANEOUS) ×15 IMPLANT
POUCH ENDO CATCH II 15MM (MISCELLANEOUS) IMPLANT
POUCH SPECIMEN RETRIEVAL 10MM (ENDOMECHANICALS) IMPLANT
RELOAD STAPLE 45 2.5 WHT DVNC (STAPLE) IMPLANT
RELOAD STAPLE 45 3.5 BLU DVNC (STAPLE) IMPLANT
RELOAD STAPLE 45 4.3 GRN DVNC (STAPLE) IMPLANT
RELOAD STAPLER 2.5X45 WHT DVNC (STAPLE) ×6 IMPLANT
RELOAD STAPLER 3.5X45 BLU DVNC (STAPLE) ×12 IMPLANT
RELOAD STAPLER 4.3X45 GRN DVNC (STAPLE) ×2 IMPLANT
RETRACTOR WOUND ALXS 19CM XSML (INSTRUMENTS) IMPLANT
RTRCTR WOUND ALEXIS 19CM XSML (INSTRUMENTS)
SCISSORS LAP 5X35 DISP (ENDOMECHANICALS) IMPLANT
SEAL CANN UNIV 5-8 DVNC XI (MISCELLANEOUS) ×4 IMPLANT
SEAL XI 5MM-8MM UNIVERSAL (MISCELLANEOUS) ×6
SEALANT PROGEL (MISCELLANEOUS) IMPLANT
SEALANT SURG COSEAL 4ML (VASCULAR PRODUCTS) IMPLANT
SEALANT SURG COSEAL 8ML (VASCULAR PRODUCTS) IMPLANT
SEALER LIGASURE MARYLAND 30 (ELECTROSURGICAL) IMPLANT
SET TUBE SMOKE EVAC HIGH FLOW (TUBING) ×3 IMPLANT
SHEET MEDIUM DRAPE 40X70 STRL (DRAPES) ×3 IMPLANT
SOLUTION ELECTROLUBE (MISCELLANEOUS) ×1 IMPLANT
SPONGE INTESTINAL PEANUT (DISPOSABLE) IMPLANT
SPONGE TONSIL TAPE 1 RFD (DISPOSABLE) IMPLANT
STAPLER 45 SUREFORM CVD (STAPLE) ×3
STAPLER 45 SUREFORM CVD DVNC (STAPLE) IMPLANT
STAPLER CANNULA SEAL DVNC XI (STAPLE) ×4 IMPLANT
STAPLER CANNULA SEAL XI (STAPLE) ×6
STAPLER RELOAD 2.5X45 WHITE (STAPLE) ×9
STAPLER RELOAD 2.5X45 WHT DVNC (STAPLE) ×6
STAPLER RELOAD 3.5X45 BLU DVNC (STAPLE) ×12
STAPLER RELOAD 3.5X45 BLUE (STAPLE) ×18
STAPLER RELOAD 4.3X45 GREEN (STAPLE) ×3
STAPLER RELOAD 4.3X45 GRN DVNC (STAPLE) ×2
STOPCOCK 4 WAY LG BORE MALE ST (IV SETS) ×3 IMPLANT
SUT MNCRL AB 3-0 PS2 18 (SUTURE) IMPLANT
SUT MNCRL AB 4-0 PS2 18 (SUTURE) ×3 IMPLANT
SUT MON AB 2-0 CT1 36 (SUTURE) IMPLANT
SUT PDS AB 1 CTX 36 (SUTURE) IMPLANT
SUT PROLENE 4 0 RB 1 (SUTURE)
SUT PROLENE 4-0 RB1 .5 CRCL 36 (SUTURE) IMPLANT
SUT SILK  1 MH (SUTURE) ×3
SUT SILK 1 MH (SUTURE) ×2 IMPLANT
SUT SILK 1 TIES 10X30 (SUTURE) IMPLANT
SUT SILK 2 0 SH (SUTURE) ×1 IMPLANT
SUT SILK 2 0SH CR/8 30 (SUTURE) IMPLANT
SUT VIC AB 1 CTX 36 (SUTURE)
SUT VIC AB 1 CTX36XBRD ANBCTR (SUTURE) IMPLANT
SUT VIC AB 2-0 CT1 27 (SUTURE) ×3
SUT VIC AB 2-0 CT1 TAPERPNT 27 (SUTURE) IMPLANT
SUT VIC AB 3-0 SH 27 (SUTURE) ×6
SUT VIC AB 3-0 SH 27X BRD (SUTURE) ×4 IMPLANT
SUT VIC AB 3-0 X1 27 (SUTURE) ×1 IMPLANT
SUT VICRYL 0 TIES 12 18 (SUTURE) ×3 IMPLANT
SUT VICRYL 0 UR6 27IN ABS (SUTURE) ×6 IMPLANT
SUT VICRYL 2 TP 1 (SUTURE) IMPLANT
SYR 10ML LL (SYRINGE) ×3 IMPLANT
SYR 20ML LL LF (SYRINGE) ×3 IMPLANT
SYR 50ML LL SCALE MARK (SYRINGE) ×3 IMPLANT
SYSTEM RETRIEVAL ANCHOR 12 (MISCELLANEOUS) ×1 IMPLANT
SYSTEM SAHARA CHEST DRAIN ATS (WOUND CARE) ×3 IMPLANT
TAPE CLOTH 4X10 WHT NS (GAUZE/BANDAGES/DRESSINGS) ×3 IMPLANT
TAPE CLOTH SURG 6X10 WHT LF (GAUZE/BANDAGES/DRESSINGS) ×1 IMPLANT
TIP APPLICATOR SPRAY EXTEND 16 (VASCULAR PRODUCTS) IMPLANT
TOWEL GREEN STERILE (TOWEL DISPOSABLE) ×3 IMPLANT
TRAY FOLEY MTR SLVR 16FR STAT (SET/KITS/TRAYS/PACK) ×3 IMPLANT
TROCAR BLADELESS 12MM (ENDOMECHANICALS) ×1 IMPLANT
TUBING EXTENTION W/L.L. (IV SETS) ×3 IMPLANT

## 2019-09-28 NOTE — Telephone Encounter (Signed)
LMTCB x2 for pt.  Pt is scheduled for a bronchoscopy this morning with TCTS.

## 2019-09-28 NOTE — Anesthesia Postprocedure Evaluation (Signed)
Anesthesia Post Note  Patient: Tina Erickson  Procedure(s) Performed: VIDEO BRONCHOSCOPY (N/A ) XI ROBOTIC ASSISTED THORASCOPY-RIGHT MIDDLE LOBECTOMY (Right Chest) Intercostal Nerve Block (Right Chest) Node Dissection (Right Chest)     Patient location during evaluation: PACU Anesthesia Type: General Level of consciousness: awake and alert Pain management: pain level controlled Vital Signs Assessment: post-procedure vital signs reviewed and stable Respiratory status: spontaneous breathing, nonlabored ventilation, respiratory function stable and patient connected to nasal cannula oxygen Cardiovascular status: blood pressure returned to baseline and stable Postop Assessment: no apparent nausea or vomiting Anesthetic complications: no    Last Vitals:  Vitals:   09/28/19 1341 09/28/19 1909  BP: (!) 161/70 (!) 146/90  Pulse: 61 66  Resp:  16  Temp:  36.6 C  SpO2: 98% 98%    Last Pain:  Vitals:   09/28/19 1909  TempSrc: Oral  PainSc:                  Tina Erickson

## 2019-09-28 NOTE — Op Note (Signed)
[address]       [place] [address]             [phone]        09/28/2019  Patient:  Tina Erickson Pre-Op Dx: Right middle lobe pulmonary nodules Post-op Dx: Same Procedure: - Bronchoscopy - Robotic assisted right video thoracoscopy -Right lobectomy - Mediastinal lymph node sampling - Intercostal nerve block  Surgeon and Role:      * Kameran Lallier, Lucile Crater, MD - Primary    *Jadene Pierini, PA-C- assisting  Anesthesia  general EBL: Minimal  Blood Administration: None Specimen: Right middle lobe, level 7 lymph nodes, level 11 and 12 lymph nodes  Drains: 28 F argyle chest tube in right chest Counts: correct   Indications: 74 year old female with a right middle lobe pulmonary nodule with increased SUV uptake, and increased hypermetabolism in the right middle lobe. I personally reviewed her images and based off my read this nodule is quite close to both the major minor fissure. Surgical biopsy would be difficult without removing the entire lobe. We discussed several other options which included a CT-guided biopsy versus navigational bronchoscopy but I think that given the appearance of these nodules and negative result would unconvincing. I explained that there is a small chance that this may be an infection but she would like to be aggressive in her treatment to exclude the possibility of cancer. Based on her lung function and exercise tolerance, I feel comfortable proceeding with a bronchoscopy, robotic assisted right thoracoscopy, right middle lobectomy. Again both her and her husband understand for definitive biopsy, a lobectomy will be required.  Findings: Firmness in the right middle lobe.  Normal vascular anatomy.  Operative Technique: After the risks, benefits and alternatives were thoroughly discussed, the patient was brought to the operative theatre.  Anesthesia was induced, and the bronchoscope was passed through the endotracheal tube.  All  segmental bronchi were visualized.  The endotracheal tube was then exchanged for a double lumen tube.  The patient was then placed in a left decubitus position and was prepped and draped in normal sterile fashion.  An appropriate surgical pause was performed, and pre-operative antibiotics were dosed accordingly.  We began by placing our 4 robotic ports in the the 7th intercostal space targeting the hilum of the lung.  A 76mm assistant port was placed in the 9th intercostal space in the anterior axillary line.  The robot was then docked and all instruments were passed under direct visualization.    The lung was then retracted superiorly, and the inferior pulmonary ligament was divided.  The hilum was mobilized anteriorly and posteriorly.  We identified the middle lobe vein, and after careful isolation, it was divided with a vascular stapler.  We next moved to the fissure.  The artery was then divided with a vascular load stapler.  The bronchus to the middle lobe was then isolated.  After a test clamp, with good ventilation of the upper and lower lobes, the bronchus was then divided.  The fissure was completed, and the specimen was passed into an endocatch bag.  It was removed from the superior access site.    Lymph nodes were then sampled at levels 7.  We explored level 4 but did not see any lymph nodes.  The chest was irrigated, and an air leak test was performed.  An intercostal nerve block was performed under direct visualization.  A 28 F chest with then placed, and we  watch the remaining lobes re-expand.  The skin and soft tissue were closed with absorbable suture    The patient tolerated the procedure without any immediate complications, and was transferred to the PACU in stable condition.  Tina Erickson Tina Erickson

## 2019-09-28 NOTE — H&P (Signed)
[address]       , [address]             [phone]        Tina Erickson  [place] Medical Record [id number]  Date of Birth: Nov 10, 1945  Referring: Crist Infante, MD  Primary Care: Crist Infante, MD  Primary Cardiologist: No primary care provider on file.  Chief Complaint: No chief complaint on file.    No events since her clinic appointment Alert NAD Sinus EWOB  74 yo female with RML pulmonary nodules.  Given the size and number she will undergo bronchoscopy, and robotic assisted right middle lobectomy.  Per my last clinic note.    History of Present Illness:  Tina Erickson 74 y.o. female presents for surgical evaluation of a 2.9 cm right middle lobe pulmonary nodule with increased avidity, and another separate area of avidity within the middle lobe as well. She has a very remote smoking history that she experimented with cigarettes in her teens but has been tobacco free for most of her life. She has been exposed to some cigar smoke from her husband but this also has been very rare. She has been asymptomatic pulmonary standpoint and also denies any neurologic symptoms. Her weight has been very stable and she remains very active and that she walks 30 minutes 2-3 times a week.  Smoking Hx:  Remote smoking history  Zubrod Score:  At the time of surgery this patient's most appropriate activity status/level should be described as:  ? 0 Normal activity, no symptoms  ? 1 Restricted in physical strenuous activity but ambulatory, able to do out light work  ? 2 Ambulatory and capable of self care, unable to do work activities, up and about >50 % of waking hours  ? 3 Only limited self care, in bed greater than 50% of waking hours  ? 4 Completely disabled, no self care, confined to bed or chair  ? 5 Moribund      Past Medical History:  Diagnosis Date  . Acid reflux    just occasionally  . Arthritis    at the base of my spine  .  Gastritis   . Hyperlipidemia   . Hypertension   . Pancreatitis         Past Surgical History:  Procedure Laterality Date  . BREAST BIOPSY Left 2000  . fractured tibia repair    . RHINOPLASTY          Family History  Problem Relation Age of Onset  . Breast cancer Sister 37  . Migraines Sister   . Hernia Mother   . Migraines Mother   . Ulcers Mother   . Heart failure Father   . Ulcers Father   . Obesity Sister    Social History      Tobacco Use  Smoking Status Never Smoker  Smokeless Tobacco Never Used   Social History      Substance and Sexual Activity  Alcohol Use Not Currently        Allergies  Allergen Reactions  . Pravachol [Pravastatin] Other (See Comments)    Thought it affected her memory and thinking  . Prednisone Other (See Comments)    Pt. Stated "it made me crazy"         Current Outpatient Medications  Medication Sig Dispense Refill  . Apoaequorin (PREVAGEN PO) Take by mouth daily.    . cholecalciferol (VITAMIN D) 1000 UNITS tablet Take  1,000 Units by mouth daily.    Marland Kitchen co-enzyme Q-10 50 MG capsule Take 50 mg by mouth daily.    Marland Kitchen esomeprazole (NEXIUM) 40 MG capsule Take 1 capsule by mouth daily.    Marland Kitchen MILK THISTLE PO Take by mouth daily.    . Multiple Vitamins-Minerals (HAIR SKIN & NAILS ADVANCED PO) Take by mouth daily.    Marland Kitchen OVER THE COUNTER MEDICATION Take 1 capsule by mouth daily. MEGA RED    . rosuvastatin (CRESTOR) 20 MG tablet Take 1 tablet (20 mg total) by mouth at bedtime. 90 tablet 3  . sucralfate (CARAFATE) 1 g tablet Take 1 tablet by mouth 3 (three) times daily.    . valsartan (DIOVAN) 80 MG tablet Take 80 mg by mouth daily.     No current facility-administered medications for this visit.   Review of Systems  Constitutional: Negative.  HENT: Negative.  Respiratory: Negative.  Cardiovascular: Negative.  Gastrointestinal: Positive for abdominal pain and heartburn.  Musculoskeletal: Negative.  Skin: Negative.  Neurological:  Negative.   PHYSICAL EXAMINATION:  There were no vitals taken for this visit.  Physical Exam  Constitutional: She is oriented to person, place, and time. She appears well-developed and well-nourished. No distress.  HENT:  Head: Normocephalic and atraumatic.  Mouth/Throat: No oropharyngeal exudate.  Eyes: Conjunctivae and EOM are normal.  Neck: No tracheal deviation present.  Cardiovascular: Normal rate, regular rhythm and normal heart sounds.  No murmur heard.  Respiratory: Effort normal and breath sounds normal. No respiratory distress.  GI: Soft. She exhibits no distension.  Musculoskeletal:  General: Normal range of motion.  Cervical back: Normal range of motion.  Neurological: She is alert and oriented to person, place, and time.  Skin: Skin is warm and dry. She is not diaphoretic.   Diagnostic Studies & Laboratory data:  Recent Radiology Findings:  Imaging Results    I have independently reviewed the above radiology studies and reviewed the findings with the patient.  Recent Lab Findings:  Recent Labs                                          PFTs:  - FVC: 122%  - FEV1: 130%  -DLCO: 77%   Problem List:  2.9 cm right middle lobe pulmonary nodule with SUV of 7.9  Hypermetabolism in the anterior right middle lobe with SUV of 2.9  Multiple subcentimeter pulmonary nodules in both lungs. No significant metabolic uptake   Assessment / Plan:  74 year old female with a right middle lobe pulmonary nodule with increased SUV uptake, and increased hypermetabolism in the right middle lobe. I personally reviewed her images and based off my read this nodule is quite close to both the major minor fissure. Surgical biopsy would be difficult without removing the entire lobe. We discussed several other options which included a CT-guided biopsy versus navigational bronchoscopy but I think that given the appearance of these nodules and negative result would unconvincing. I explained  that there is a small chance that this may be an infection but she would like to be aggressive resection with inclusion of cancer. Based on her lung function and exercise tolerance, I feel comfortable proceeding with a bronchoscopy, robotic assisted right thoracoscopy, right middle lobectomy. Again both her and her husband understand for definitive biopsy lobectomy required, given that it is agreeable with this plan.    Tina Erickson Bary Leriche

## 2019-09-28 NOTE — Brief Op Note (Signed)
09/28/2019  10:52 AM  PATIENT:  Tina Erickson  74 y.o. female  PRE-OPERATIVE DIAGNOSIS:  RML PULMONARY NODULE  POST-OPERATIVE DIAGNOSIS:  RML PULMONARY NODULE  PROCEDURE:  Procedure(s): VIDEO BRONCHOSCOPY (N/A) XI ROBOTIC ASSISTED THORASCOPY-RIGHT MIDDLE LOBECTOMY (Right) Intercostal Nerve Block (Right) Node Dissection (Right)  SURGEON:  Surgeon(s) and Role:    * Lightfoot, Lucile Crater, MD - Primary  PHYSICIAN ASSISTANT: Vuong Musa PA-C   ANESTHESIA:   general  EBL:  50 mL   BLOOD ADMINISTERED:none  DRAINS: 1 28 F Chest Tube(s) in the RIGHT HEMITHORAX   LOCAL MEDICATIONS USED:   EXPAREL  SPECIMEN:  Source of Specimen:  RML AND MULTIPLE LN SAMPLES  DISPOSITION OF SPECIMEN:  PATHOLOGY  COUNTS:  YES  TOURNIQUET:  * No tourniquets in log *  DICTATION: .Dragon Dictation  PLAN OF CARE: Admit to inpatient   PATIENT DISPOSITION:  PACU - hemodynamically stable.   Delay start of Pharmacological VTE agent (>24hrs) due to surgical blood loss or risk of bleeding: yes  COMPLICATIONS: NO KNOWN

## 2019-09-28 NOTE — Anesthesia Procedure Notes (Addendum)
Arterial Line Insertion Start/End4/10/2019 7:00 AM, 09/28/2019 7:09 AM Performed by: Lavell Luster, CRNA, CRNA  Preanesthetic checklist: patient identified, IV checked, risks and benefits discussed, surgical consent, pre-op evaluation and timeout performed Lidocaine 1% used for infiltration Left, radial was placed Catheter size: 20 G Hand hygiene performed , maximum sterile barriers used  and Seldinger technique used Allen's test indicative of satisfactory collateral circulation Attempts: 1 Procedure performed without using ultrasound guided technique. Following insertion, Biopatch and dressing applied. Post procedure assessment: normal  Patient tolerated the procedure well with no immediate complications.

## 2019-09-28 NOTE — Transfer of Care (Signed)
Immediate Anesthesia Transfer of Care Note  Patient: Tina Erickson  Procedure(s) Performed: VIDEO BRONCHOSCOPY (N/A ) XI ROBOTIC ASSISTED THORASCOPY-RIGHT MIDDLE LOBECTOMY (Right Chest) Intercostal Nerve Block (Right Chest) Node Dissection (Right Chest)  Patient Location: PACU  Anesthesia Type:General  Level of Consciousness: awake, alert  and sedated  Airway & Oxygen Therapy: Patient connected to face mask oxygen  Post-op Assessment: Post -op Vital signs reviewed and stable  Post vital signs: stable  Last Vitals:  Vitals Value Taken Time  BP 128/71 09/28/19 1121  Temp    Pulse 65 09/28/19 1124  Resp 22 09/28/19 1124  SpO2 100 % 09/28/19 1124  Vitals shown include unvalidated device data.  Last Pain:  Vitals:   09/28/19 0641  TempSrc:   PainSc: 0-No pain         Complications: No apparent anesthesia complications

## 2019-09-28 NOTE — Anesthesia Procedure Notes (Signed)
Procedure Name: Intubation Date/Time: 09/28/2019 7:45 AM Performed by: Lavell Luster, CRNA Pre-anesthesia Checklist: Patient identified, Emergency Drugs available, Suction available, Patient being monitored and Timeout performed Patient Re-evaluated:Patient Re-evaluated prior to induction Oxygen Delivery Method: Circle system utilized Preoxygenation: Pre-oxygenation with 100% oxygen Induction Type: IV induction Ventilation: Mask ventilation without difficulty Laryngoscope Size: Mac and 3 Grade View: Grade II Tube type: Oral Endobronchial tube: Left, Double lumen EBT and EBT position confirmed by fiberoptic bronchoscope and 37 Fr Number of attempts: 1 Airway Equipment and Method: Stylet Placement Confirmation: ETT inserted through vocal cords under direct vision,  positive ETCO2 and breath sounds checked- equal and bilateral Tube secured with: Tape Dental Injury: Teeth and Oropharynx as per pre-operative assessment  Comments: Verified by Dr Gifford Shave and Dr Kipp Brood

## 2019-09-29 ENCOUNTER — Inpatient Hospital Stay (HOSPITAL_COMMUNITY): Payer: Medicare Other

## 2019-09-29 LAB — BLOOD GAS, ARTERIAL
Acid-base deficit: 0.2 mmol/L (ref 0.0–2.0)
Bicarbonate: 23.5 mmol/L (ref 20.0–28.0)
Drawn by: 51702
FIO2: 21
O2 Saturation: 96.7 %
Patient temperature: 36.8
pCO2 arterial: 35.1 mmHg (ref 32.0–48.0)
pH, Arterial: 7.44 (ref 7.350–7.450)
pO2, Arterial: 82.5 mmHg — ABNORMAL LOW (ref 83.0–108.0)

## 2019-09-29 LAB — BASIC METABOLIC PANEL
Anion gap: 15 (ref 5–15)
BUN: 14 mg/dL (ref 8–23)
CO2: 22 mmol/L (ref 22–32)
Calcium: 8.7 mg/dL — ABNORMAL LOW (ref 8.9–10.3)
Chloride: 98 mmol/L (ref 98–111)
Creatinine, Ser: 0.79 mg/dL (ref 0.44–1.00)
GFR calc Af Amer: >60 mL/min (ref >60)
GFR calc non Af Amer: >60 mL/min (ref >60)
Glucose, Bld: 135 mg/dL — ABNORMAL HIGH (ref 70–99)
Potassium: 3.4 mmol/L — ABNORMAL LOW (ref 3.5–5.1)
Sodium: 135 mmol/L (ref 135–145)

## 2019-09-29 LAB — CBC
HCT: 34.4 % — ABNORMAL LOW (ref 36.0–46.0)
Hemoglobin: 11.6 g/dL — ABNORMAL LOW (ref 12.0–15.0)
MCH: 29.9 pg (ref 26.0–34.0)
MCHC: 33.7 g/dL (ref 30.0–36.0)
MCV: 88.7 fL (ref 80.0–100.0)
Platelets: 242 10*3/uL (ref 150–400)
RBC: 3.88 MIL/uL (ref 3.87–5.11)
RDW: 12.5 % (ref 11.5–15.5)
WBC: 12 10*3/uL — ABNORMAL HIGH (ref 4.0–10.5)
nRBC: 0 % (ref 0.0–0.2)

## 2019-09-29 MED ORDER — TRAMADOL HCL 50 MG PO TABS: 50.0000 mg | ORAL_TABLET | Freq: Four times a day (QID) | ORAL | 0 refills | Status: DC | PRN

## 2019-09-29 MED ORDER — POTASSIUM CHLORIDE CRYS ER 20 MEQ PO TBCR
20.0000 meq | EXTENDED_RELEASE_TABLET | ORAL | Status: DC
  Administered 2019-09-29 (×2): 20 meq via ORAL
  Filled 2019-09-29 (×2): qty 1

## 2019-09-29 NOTE — Discharge Summary (Signed)
Physician Discharge Summary       [address]       [place] [address]             [phone]    Patient ID: Tina Erickson MRN: [id number] DOB/AGE: 1945-08-31 74 y.o.  Admit date: 09/28/2019 Discharge date: 09/29/2019  Admission Diagnoses: Right middle lobe pulmonary nodules  Discharge Diagnoses:  1. S/p bronchoscopy, robotic assisted right thoracoscopy, right lobectomy, lymph node sampling, and intercostal nerve block.  2. History of hypertension 3. History of hyperlipidemia 4. History of pancreatitis 5. History of occasional acid reflux 6. History of gastritis 7. History of arthritis  Consults: None  Procedure (s):  Bronchoscopy - Robotic assisted right video thoracoscopy -Right lobectomy - Mediastinal lymph node sampling - Intercostal nerve block by Dr. Kipp Brood on 09/28/2019.  Pathology: Final results pending.  History of Presenting Illness: Tina Erickson 74 y.o. female presents for surgical evaluation of a 2.9 cm right middle lobe pulmonary nodule with increased activity and another separate area of avidity within the middle lobe as well. She has a very remote smoking history that she experimented with cigarettes in her teens but has been tobacco free for most of her life. She has been exposed to some cigar smoke from her husband but this also has been very rare. She has been asymptomatic pulmonary standpoint and also denies any neurologic symptoms. Her weight has been very stable and she remains very active and that she walks 30 minutes 2-3 times a week.  Dr.Lightfoot personally reviewed her images and based off my read this nodule is quite close to both the major minor fissure. Surgical biopsy would be difficult without removing the entire lobe. We discussed several other options which included a CT-guided biopsy versus navigational bronchoscopy but I think that given the appearance of these nodules and negative result would  unconvincing. Dr. Kipp Brood explained that there is a small chance that this may be an infection but she would like to be aggressive resection with inclusion of cancer. Based on her lung function and exercise tolerance, Dr. Kipp Brood feels comfortable proceeding with a bronchoscopy, robotic assisted right thoracoscopy, right middle lobectomy. Again, both her and her husband understand for definitive biopsy lobectomy required, given that it is agreeable with this plan.  She presented to [facility] on 04/05 in order to undergo a bronchoscopy,   Brief Hospital Course:  She remains afebrile, hemodynamically stable, and good oxygenation on room air this am. Chest tube is to suction and has no air leak. CXR this am did not show a pneumothorax. Dr. Kipp Brood removed the chest tube. Follow up chest xray showed trace right pneumothorax. Patient is tolerating a diet and her wounds are clean, dry, and healing without signs of infection. Per Dr. Kipp Brood, patient is surgically stable for discharge today.   Latest Vital Signs: Blood pressure 106/60, pulse 65, temperature 98.2 F (36.8 C), temperature source Oral, resp. rate 17, height 5\' 3"  (1.6 m), weight 56.7 kg, SpO2 99 %.  Discharge Condition: Stable and discharged to home.  Recent laboratory studies:  Lab Results  Component Value Date   WBC 12.0 (H) 09/29/2019   HGB 11.6 (L) 09/29/2019   HCT 34.4 (L) 09/29/2019   MCV 88.7 09/29/2019   PLT 242 09/29/2019   Lab Results  Component Value Date   NA 135 09/29/2019   K 3.4 (L) 09/29/2019   CL 98 09/29/2019   CO2 22 09/29/2019   CREATININE 0.79 09/29/2019  GLUCOSE 135 (H) 09/29/2019      Diagnostic Studies: DG Chest 2 View  Result Date: 09/28/2019 CLINICAL DATA:  Preop for right lung surgery. Right middle lobe lung lesion. EXAM: CHEST - 2 VIEW COMPARISON:  Chest x-ray 01/05/2019 and PET-CT 09/04/2019 FINDINGS: The cardiac silhouette, mediastinal and hilar contours are within normal limits and  stable. Underlying emphysematous changes with hyperinflation and areas of pulmonary scarring. Stable right middle lobe lung lesions. No pleural effusion. The bony thorax is intact. IMPRESSION: Emphysematous changes and pulmonary scarring but no acute overlying pulmonary process. Stable right middle lobe lung lesions. Electronically Signed   By: Marijo Sanes M.D.   On: 09/28/2019 06:20   CT Chest Wo Contrast  Result Date: 09/04/2019 CLINICAL DATA:  Right lower lobe pulmonary nodule. Right middle lobe infiltrate. No cancer history. EXAM: CT CHEST WITHOUT CONTRAST TECHNIQUE: Multidetector CT imaging of the chest was performed following the standard protocol without IV contrast. COMPARISON:  March 05, 2019 in January 09, 2019 FINDINGS: Cardiovascular: Normal heart size. Paucity of coronary calcification. No pericardial effusion. Aortic calcified atherosclerosis without a thoracic aortic aneurysm. Mild bone demineralization. Multilevel degenerative endplate changes of the spine. A benign bone island in the T5 vertebral body. Mediastinum/Nodes: Benign-appearing noncalcified small lymph nodes. Lungs/Pleura: Bilateral apical pleural thickening. A couple 5 mm right pulmonary nodules, subsolid in the upper lobe on series 3, image 73; mixed density in the right base on image 55; and coarsely calcified in the right base on image 90. Two clustered sub solid micro nodules in the right base on image 138. A 5 mm mixed left basilar nodule is present on image 76. These findings are not significantly changed compared to the prior and July 2020 study. There is no pleural effusion. Mass-like spiculated consolidation in the right middle lobe contiguous with the major and minor fissure measures 3.0 by 2.5 by 0.9 cm in maximum dimensions. Previously it measured 2.6 by 2.4 by 0.7 cm in maximum dimension with less bulky, more streaky appearance. There are residual tree-in-bud opacities in the right base. Upper Abdomen: Aortic calcified  atherosclerosis. Musculoskeletal: Multilevel spinal mild degenerative endplate changes. A benign T5 bone island. Diffuse bone demineralization. A non-STAT PRA not was generated at the time of this dictation. IMPRESSION: 1. Consolidated pulmonary mass in the right middle lobe contiguous with the major and minor fissure marginally increased in greatest dimensions, but with more volume/bulky appearance. An infection or malignant etiology are differential considerations. Histopathological characterization recommended. 2. Stable other 5 mm bilateral pulmonary nodules and bilateral apical pleural thickening since July 2020. 3. Aortic atherosclerosis. Paucity of coronary calcification. 4. 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 5. Diffuse bone demineralization. Correlation with DEXA may be useful. Aortic Atherosclerosis (ICD10-I70.0). Electronically Signed   By: Revonda Humphrey   On: 09/04/2019 23:14   NM PET Image Initial (PI) Skull Base To Thigh  Result Date: 09/15/2019 CLINICAL DATA:  Initial treatment strategy for enlarging right middle lobe pulmonary nodule. EXAM: NUCLEAR MEDICINE PET SKULL BASE TO THIGH TECHNIQUE: 6.1 mCi F-18 FDG was injected intravenously. Full-ring PET imaging was performed from the skull base to thigh after the radiotracer. CT data was obtained and used for attenuation correction and anatomic localization. Fasting blood glucose: 78 mg/dl COMPARISON:  09/04/2019 chest CT. FINDINGS: Mediastinal blood pool activity: SUV max 2.5 Liver activity: SUV max NA NECK: No hypermetabolic lymph nodes in the neck. Incidental CT findings: none CHEST: No hypermetabolic axillary, mediastinal or hilar  lymph nodes. Hypermetabolic irregular peripheral posterior right middle lobe 2.9 x 2.2 cm pulmonary nodule with max SUV 7.9 (series 8/image 42), abutting and distorting the major fissure, stable since 09/04/2019 chest CT, increased from 2.5 x 2.0 cm on  03/05/2019 chest CT. Mild hypermetabolism associated with patchy chronic tree-in-bud opacities in the anterior right middle lobe with max SUV 2.9 (series 8/image 49), not substantially changed since 03/05/2019 chest CT. No additional hypermetabolic pulmonary findings. A few additional scattered subcentimeter solid pulmonary nodules and patchy mild regions of tree-in-bud opacity in both lungs, largest 0.9 cm in the left lower lobe (series 8/image 45), without significant metabolic activity. Incidental CT findings: Atherosclerotic nonaneurysmal thoracic aorta. ABDOMEN/PELVIS: No abnormal hypermetabolic activity within the liver, pancreas, adrenal glands, or spleen. No hypermetabolic lymph nodes in the abdomen or pelvis. Incidental CT findings: Apparent gas within the nondistended gallbladder (series 4/image 114). Atherosclerotic nonaneurysmal abdominal aorta. Mild sigmoid diverticulosis. SKELETON: No focal hypermetabolic activity to suggest skeletal metastasis. Incidental CT findings: none IMPRESSION: 1. Hypermetabolic (max SUV 7.9) irregular 2.9 cm posterior right middle lobe pulmonary nodule abutting and distorting the right major fissure, increased in size since 03/05/2019 chest CT. Primary bronchogenic carcinoma to be excluded, although differential includes progressive chronic infection such as due to atypical mycobacterial infection (MAI) given mild patchy chronic tree-in-bud opacities in both lungs. Tissue sampling suggested. 2. No hypermetabolic thoracic adenopathy or distant metastatic disease. 3. Apparent gas within the nondistended gallbladder, poorly evaluated on these motion degraded noncontrast CT images, potentially artifactual. Consider dedicated CT abdomen/pelvis with oral and IV contrast for further evaluation, if clinically warranted. 4.  Aortic Atherosclerosis (ICD10-I70.0). Electronically Signed   By: Ilona Sorrel M.D.   On: 09/15/2019 15:27   DG Chest Port 1 View  Result Date:  09/29/2019 CLINICAL DATA:  History of pneumothorax. EXAM: PORTABLE CHEST 1 VIEW COMPARISON:  Prior chest x-ray same day. FINDINGS: Mediastinum and hilar structures normal. Heart size normal. Postsurgical changes right lung. Interim removal of right chest tube. Small right apical pneumothorax noted. Tiny right pleural effusion noted. IMPRESSION: Interim removal of right chest tube. Tiny right apical pneumothorax noted. Critical Value/emergent results were called by telephone at the time of interpretation on 09/29/2019 at 1:20 pm to nurse Alyse Low , who verbally acknowledged these results. Electronically Signed   By: Marcello Moores  Register   On: 09/29/2019 13:23   DG CHEST PORT 1 VIEW  Result Date: 09/29/2019 CLINICAL DATA:  Status post right middle lobe lobectomy. EXAM: PORTABLE CHEST 1 VIEW COMPARISON:  Chest x-ray 09/28/2019 FINDINGS: Stable right-sided chest tube. No definite pneumothorax. The cardiac silhouette, mediastinal and hilar contours are within normal limits. Minimal streaky subsegmental atelectasis in the right lung. No pleural effusion. IMPRESSION: Stable right-sided chest tube without definite pneumothorax. Electronically Signed   By: Marijo Sanes M.D.   On: 09/29/2019 08:46   DG Chest Port 1 View  Result Date: 09/28/2019 CLINICAL DATA:  Status post right lobectomy. EXAM: PORTABLE CHEST 1 VIEW COMPARISON:  Same day. FINDINGS: Stable cardiomediastinal silhouette. Right-sided chest tube is noted without evidence of pneumothorax. Minimal right basilar subsegmental atelectasis is noted. Left lung is clear. Bony thorax is unremarkable. IMPRESSION: Right-sided chest tube is noted without evidence of pneumothorax. Minimal right basilar subsegmental atelectasis. Electronically Signed   By: Marijo Conception M.D.   On: 09/28/2019 13:43   MM 3D SCREEN BREAST BILATERAL  Result Date: 09/01/2019 CLINICAL DATA:  Screening. EXAM: DIGITAL SCREENING BILATERAL MAMMOGRAM WITH TOMO AND CAD COMPARISON:  Previous exam(s).  ACR  Breast Density Category b: There are scattered areas of fibroglandular density. FINDINGS: There are no findings suspicious for malignancy. Images were processed with CAD. IMPRESSION: No mammographic evidence of malignancy. A result letter of this screening mammogram will be mailed directly to the patient. RECOMMENDATION: Screening mammogram in one year. (Code:[id number]) BI-RADS CATEGORY  1: Negative. Electronically Signed   By: Abelardo Diesel M.D.   On: 09/01/2019 10:58         Discharge Medications: Allergies as of 09/29/2019      Reactions   Pravachol [pravastatin] Other (See Comments)   Thought it affected her memory and thinking   Prednisone Other (See Comments)   Pt. Stated "it made me crazy"      Medication List    TAKE these medications   co-enzyme Q-10 50 MG capsule Take 50 mg by mouth daily.   esomeprazole 40 MG capsule Commonly known as: NEXIUM Take 40 mg by mouth daily.   KRILL OIL PO Take 1 capsule by mouth daily.   milk thistle 175 MG tablet Take 175 mg by mouth daily.   PREVAGEN PO Take 1 tablet by mouth daily.   rosuvastatin 20 MG tablet Commonly known as: CRESTOR Take 1 tablet (20 mg total) by mouth at bedtime.   sucralfate 1 g tablet Commonly known as: CARAFATE Take 1 tablet by mouth daily as needed (acid reflux).   traMADol 50 MG tablet Commonly known as: ULTRAM Take 1 tablet (50 mg total) by mouth every 6 (six) hours as needed (mild pain).   triamcinolone cream 0.1 % Commonly known as: KENALOG Apply 1 application topically 2 (two) times daily as needed (itching).   valsartan 80 MG tablet Commonly known as: DIOVAN Take 80 mg by mouth daily.   vitamin E 180 MG (400 UNITS) capsule Take 400 Units by mouth daily.       Follow Up Appointments: Follow-up Information    Lajuana Matte, MD. Go on 10/02/2019.   Specialty: Cardiothoracic Surgery Why: Appointment is at 1:45 Contact information: [place]  [address] [phone]           Signed: Ellamae Sia 09/29/2019, 1:28 PM

## 2019-09-29 NOTE — Progress Notes (Signed)
Removed PIV access x 2 and received discharge instructions. Patient understood it well. Pt's husband took her all belongings. HS [employer]

## 2019-09-29 NOTE — Discharge Instructions (Signed)
Thoracoscopy, Care After This sheet gives you information about how to care for yourself after your procedure. Your health care provider may also give you more specific instructions. If you have problems or questions, contact your health care provider. What can I expect after the procedure? After the procedure, it is common to have pain and soreness in the surgical area. Follow these instructions at home: Incision care   Follow instructions from your health care provider about how to take care of your incision. Make sure you: ? Wash your hands with soap and water before you change your bandage (dressing). If soap and water are not available, use hand sanitizer. ? Change your dressing as told by your health care provider. ? Leave stitches (sutures), skin glue, or adhesive strips in place. These skin closures may need to stay in place for 2 weeks or longer. If adhesive strip edges start to loosen and curl up, you may trim the loose edges. Do not remove adhesive strips completely unless your health care provider tells you to do that.  Check your incision areas every day for signs of infection. Check for: ? Redness, swelling, or pain. ? Fluid or blood. ? Warmth. ? Pus or a bad smell.  Do not take baths, swim, or use a hot tub until your health care provider approves. You may take showers. Medicines  Take over-the-counter and prescription medicines only as told by your health care provider.  If you were prescribed an antibiotic medicine, take it as told by your health care provider. Do not stop taking the antibiotic even if you start to feel better.  Do not drive or use heavy machinery while taking prescription pain medicine.  If you are taking prescription pain medicine, take actions to prevent or treat constipation. Your health care provider may recommend that you: ? Drink enough fluid to keep your urine pale yellow. ? Eat foods that are high in fiber, such as fresh fruits and vegetables,  whole grains, and beans. ? Limit foods that are high in fat and processed sugars, such as fried and sweet foods. ? Take an over-the-counter or prescription medicine for constipation. Managing pain, stiffness, and swelling   If directed, put ice on the affected area: ? Put ice in a plastic bag. ? Place a towel between your skin and the bag. ? Leave the ice on for 20 minutes, 2-3 times a day. Preventing lung infection  To prevent pneumonia and to keep your lungs healthy: ? Try to cough often. If it hurts to cough, hold a pillow against your chest as you cough. ? Take deep breaths or do breathing exercises as instructed by your health care provider. ? If you were given an incentive spirometer, use it as directed by your health care provider. General instructions  Do not lift anything that is heavier than 10 lb (4.5 kg), or the limit that you are told, until your health care provider says that it is safe.  Do not use any products that contain nicotine or tobacco, such as cigarettes and e-cigarettes. These can delay healing after surgery. If you need help quitting, ask your health care provider.  Avoid driving until your health care provider approves.  If you have a chest drainage tube, care for it as instructed by your health care provider. Do not travel by airplane after the chest drainage tube is removed until your health care provider approves.  Keep all follow-up visits as told by your health care provider. This is important. Contact   a health care provider if:  You have a fever.  Pain medicines do not ease your pain.  You have redness, swelling, or increasing pain in your incision area.  You develop a cough that does not go away, or you are coughing up mucus that is yellow or green. Get help right away if:  You have fluid, blood, or pus coming from your incision.  There is a bad smell coming from your incision or dressing.  You develop a rash.  You cough up blood.  You  develop light-headedness, or you feel faint.  You have difficulty breathing.  You develop chest pain.  Your heartbeat feels irregular or very fast. These symptoms may represent a serious problem that is an emergency. Do not wait to see if the symptoms will go away. Get medical help right away. Call your local emergency services (911 in the [place] Do not drive yourself to the hospital. Summary  Follow instructions from your health care provider about how to take care of your incision.  Do not drive or use heavy machinery while taking prescription pain medicine.  Leave stitches (sutures), skin glue, or adhesive strips in place.  Check your incision areas every day for signs of infection. This information is not intended to replace advice given to you by your health care provider. Make sure you discuss any questions you have with your health care provider. Document Revised: 05/24/2017 Document Reviewed: 05/21/2017 Elsevier Patient Education  2020 [employer]  

## 2019-09-29 NOTE — Progress Notes (Signed)
     [address]       [place] [address]             [phone]       No events.  Some pain in the back Vitals:   09/29/19 0356 09/29/19 0747  BP: 125/65 118/67  Pulse: (!) 57 (!) 56  Resp: 15 20  Temp: 98.2 F (36.8 C) 97.8 F (36.6 C)  SpO2: 99% 100%   Alert NAD Sinus EWOB, no leak on CT  CXR: stable   74 yo female s/p RATS RMLectomy - chest tube out - repeat CXR this afternoon - likely discharge today.  Necie Wilcoxson Bary Leriche

## 2019-09-29 NOTE — Telephone Encounter (Signed)
Spoke with pt. States that she is currently in the hospital. Part of her lung was removed. Pt did not need anything from our office at this time. Message will be closed.

## 2019-09-30 ENCOUNTER — Other Ambulatory Visit: Payer: Self-pay

## 2019-10-01 LAB — SURGICAL PATHOLOGY

## 2019-10-02 ENCOUNTER — Emergency Department (HOSPITAL_COMMUNITY)
Admission: EM | Admit: 2019-10-02 | Discharge: 2019-10-02 | Disposition: A | Discharge status: Home / Self Care | Payer: Medicare Other | Attending: Emergency Medicine | Admitting: Emergency Medicine

## 2019-10-02 ENCOUNTER — Encounter: Payer: Self-pay | Admitting: Thoracic Surgery (Cardiothoracic Vascular Surgery)

## 2019-10-02 ENCOUNTER — Ambulatory Visit (INDEPENDENT_AMBULATORY_CARE_PROVIDER_SITE_OTHER): Payer: Self-pay | Admitting: Thoracic Surgery (Cardiothoracic Vascular Surgery)

## 2019-10-02 ENCOUNTER — Encounter (HOSPITAL_COMMUNITY): Payer: Self-pay

## 2019-10-02 ENCOUNTER — Other Ambulatory Visit: Payer: Self-pay

## 2019-10-02 ENCOUNTER — Emergency Department (HOSPITAL_COMMUNITY): Payer: Medicare Other

## 2019-10-02 VITALS — BP 120/74 | HR 80 | Temp 97.7°F | Resp 20 | Ht 63.0 in | Wt 124.6 lb

## 2019-10-02 DIAGNOSIS — J939 Pneumothorax, unspecified: Secondary | ICD-10-CM | POA: Diagnosis not present

## 2019-10-02 DIAGNOSIS — I10 Essential (primary) hypertension: Secondary | ICD-10-CM | POA: Diagnosis not present

## 2019-10-02 DIAGNOSIS — Z79899 Other long term (current) drug therapy: Secondary | ICD-10-CM | POA: Insufficient documentation

## 2019-10-02 DIAGNOSIS — R52 Pain, unspecified: Secondary | ICD-10-CM | POA: Diagnosis not present

## 2019-10-02 DIAGNOSIS — M545 Low back pain: Secondary | ICD-10-CM | POA: Diagnosis not present

## 2019-10-02 DIAGNOSIS — Z902 Acquired absence of lung [part of]: Secondary | ICD-10-CM

## 2019-10-02 DIAGNOSIS — G8918 Other acute postprocedural pain: Secondary | ICD-10-CM

## 2019-10-02 DIAGNOSIS — R55 Syncope and collapse: Secondary | ICD-10-CM

## 2019-10-02 DIAGNOSIS — R911 Solitary pulmonary nodule: Secondary | ICD-10-CM

## 2019-10-02 DIAGNOSIS — M5489 Other dorsalgia: Secondary | ICD-10-CM | POA: Diagnosis not present

## 2019-10-02 LAB — BASIC METABOLIC PANEL
Anion gap: 11 (ref 5–15)
BUN: 9 mg/dL (ref 8–23)
CO2: 24 mmol/L (ref 22–32)
Calcium: 8.8 mg/dL — ABNORMAL LOW (ref 8.9–10.3)
Chloride: 101 mmol/L (ref 98–111)
Creatinine, Ser: 0.78 mg/dL (ref 0.44–1.00)
GFR calc Af Amer: >60 mL/min (ref >60)
GFR calc non Af Amer: >60 mL/min (ref >60)
Glucose, Bld: 156 mg/dL — ABNORMAL HIGH (ref 70–99)
Potassium: 3.5 mmol/L (ref 3.5–5.1)
Sodium: 136 mmol/L (ref 135–145)

## 2019-10-02 LAB — URINALYSIS, ROUTINE W REFLEX MICROSCOPIC
Bilirubin Urine: NEGATIVE
Glucose, UA: NEGATIVE mg/dL
Hgb urine dipstick: NEGATIVE
Ketones, ur: NEGATIVE mg/dL
Leukocytes,Ua: NEGATIVE
Nitrite: NEGATIVE
Protein, ur: NEGATIVE mg/dL
Specific Gravity, Urine: 1.009 (ref 1.005–1.030)
pH: 6 (ref 5.0–8.0)

## 2019-10-02 LAB — CBG MONITORING, ED: Glucose-Capillary: 132 mg/dL — ABNORMAL HIGH (ref 70–99)

## 2019-10-02 LAB — CBC
HCT: 35.6 % — ABNORMAL LOW (ref 36.0–46.0)
Hemoglobin: 11.3 g/dL — ABNORMAL LOW (ref 12.0–15.0)
MCH: 29.4 pg (ref 26.0–34.0)
MCHC: 31.7 g/dL (ref 30.0–36.0)
MCV: 92.7 fL (ref 80.0–100.0)
Platelets: 294 10*3/uL (ref 150–400)
RBC: 3.84 MIL/uL — ABNORMAL LOW (ref 3.87–5.11)
RDW: 12.9 % (ref 11.5–15.5)
WBC: 11.1 10*3/uL — ABNORMAL HIGH (ref 4.0–10.5)
nRBC: 0 % (ref 0.0–0.2)

## 2019-10-02 MED ORDER — OXYCODONE-ACETAMINOPHEN 5-325 MG PO TABS
1.0000 | ORAL_TABLET | Freq: Once | ORAL | Status: AC
  Administered 2019-10-02: 19:00:00 1 via ORAL
  Filled 2019-10-02: qty 1

## 2019-10-02 MED ORDER — SODIUM CHLORIDE 0.9% FLUSH: 3.0000 mL | Freq: Once | INTRAVENOUS | Status: DC

## 2019-10-02 MED ORDER — OXYCODONE-ACETAMINOPHEN 5-325 MG PO TABS: 1.0000 | ORAL_TABLET | Freq: Four times a day (QID) | ORAL | 0 refills | Status: DC | PRN

## 2019-10-02 NOTE — ED Notes (Signed)
CBG Results of 132 reported to [place], Therapist, sports.

## 2019-10-02 NOTE — ED Triage Notes (Signed)
Patient arrived by [facility] following witnessed syncopal event at MD office following lobectomy 4 days ago for nodules. Patient pale on arrival and had BP 50/30 in office. Reports pain to incision site. Patient arrived with NSL and alert and oriented. Reports decreased appetite x 3 days

## 2019-10-02 NOTE — Discharge Instructions (Addendum)
1.  Stay hydrated and rest.  Careful with position changes. 2.  You may take 1-2 Percocet every 6 hours for pain control.  Soon his pain is adequately controlled, resume taking Tylenol for pain.  Each Percocet contains the equivalent of 1 regular strength 325 mg tablet of Tylenol.  For a normal dose of Tylenol, you may combine 1 Percocet tablet with 1 extra strength Tylenol on an every 6 hour basis. 3.  To avoid constipation, take Colace (or other over-the-counter stool softener) twice a day.  If you have not had a bowel movement after 2 or 3 days, add MiraLAX intake daily until you have a normal bowel movement. 4.  Return to the emergency department if you develop lightheadedness, sensation of passing out, suddenly increasing pain, fever or other concerning symptoms. 5.  Follow-up with Dr. Kipp Brood as planned.

## 2019-10-02 NOTE — ED Provider Notes (Signed)
[facility] EMERGENCY DEPARTMENT Provider Note   CSN: [id number] Arrival date & time: 10/02/19  1510     History Chief Complaint  Patient presents with  . Loss of Consciousness    Tina Erickson is a 74 y.o. female.  HPI Patient is status post right lobectomy on 4\5\2021.  This was done by Dr. Kipp Brood of [facility].  Evaluation was for pulmonary nodules.  Results show nonmalignant findings.  Patient was in follow-up at the office today.  She reports it seems very hot in the office and she had been sitting for about 30 minutes.  She reports she suddenly started to feel very lightheaded.  She had a near syncopal event and blood pressures were measured in systolics of [id number].  Patient was given a fluid bolus and pressures rebounded back to normal.  Patient reports that she has been having pain at her surgical site.  He has been trying to take Tylenol at the daytime and tramadol at night.  She reports today the pain medicine just really was not working.  No sudden increase in shortness of breath.  No vomiting or abdominal pain.  No swelling or pain in the calves.  She reports about a week before surgery, she had been doing some specific intense calf exercises and had a day or 2 of pain in the left calf.  She reports she stopped doing the exercises and the pain resolved and has not returned.    Past Medical History:  Diagnosis Date  . Acid reflux    just occasionally  . Arthritis    at the base of my spine  . Gastritis   . Hyperlipidemia   . Hypertension   . Pancreatitis     Patient Active Problem List   Diagnosis Date Noted  . Lung mass 09/28/2019  . Right middle lobe pulmonary infiltrate 02/14/2019  . Solitary pulmonary nodule 02/14/2019  . Essential hypertension 12/31/2018  . Chest pain of uncertain etiology 123XX123  . Paresthesia 11/19/2018  . Mixed hyperlipidemia 11/19/2018  . Family history of early CAD 11/19/2018  .  Pancreatitis   . Gastritis     Past Surgical History:  Procedure Laterality Date  . BREAST BIOPSY Left 2000  . CATARACT EXTRACTION, BILATERAL    . fractured tibia repair    . INTERCOSTAL NERVE BLOCK Right 09/28/2019   Procedure: Intercostal Nerve Block;  Surgeon: Lajuana Matte, MD;  Location: [facility];  Service: Thoracic;  Laterality: Right;  . NODE DISSECTION Right 09/28/2019   Procedure: Node Dissection;  Surgeon: Lajuana Matte, MD;  Location: [facility];  Service: Thoracic;  Laterality: Right;  . RHINOPLASTY    . VIDEO BRONCHOSCOPY N/A 09/28/2019   Procedure: VIDEO BRONCHOSCOPY;  Surgeon: Lajuana Matte, MD;  Location: [facility];  Service: Thoracic;  Laterality: N/A;     OB History   No obstetric history on file.     Family History  Problem Relation Age of Onset  . Breast cancer Sister 67  . Migraines Sister   . Hernia Mother   . Migraines Mother   . Ulcers Mother   . Heart failure Father   . Ulcers Father   . Obesity Sister     Social History   Tobacco Use  . Smoking status: Never Smoker  . Smokeless tobacco: Never Used  Substance Use Topics  . Alcohol use: Yes    Comment: 2 per week  . Drug use: No  Home Medications Prior to Admission medications   Medication Sig Start Date End Date Taking? Authorizing Provider  Apoaequorin (PREVAGEN PO) Take 1 tablet by mouth daily.     [provider]  co-enzyme Q-10 50 MG capsule Take 50 mg by mouth daily.    [provider]  esomeprazole (NEXIUM) 40 MG capsule Take 40 mg by mouth daily.  01/14/19   [provider]  KRILL OIL PO Take 1 capsule by mouth daily.    [provider]  milk thistle 175 MG tablet Take 175 mg by mouth daily.     [provider]  rosuvastatin (CRESTOR) 20 MG tablet Take 1 tablet (20 mg total) by mouth at bedtime. 06/11/19 06/10/20  Patwardhan, Reynold Bowen, MD  sucralfate (CARAFATE) 1 g tablet Take 1 tablet by mouth daily as needed (acid reflux).   01/23/19   [provider]  traMADol (ULTRAM) 50 MG tablet Take 1 tablet (50 mg total) by mouth every 6 (six) hours as needed (mild pain). 09/29/19   Nani Skillern, PA-C  triamcinolone cream (KENALOG) 0.1 % Apply 1 application topically 2 (two) times daily as needed (itching).    [provider]  valsartan (DIOVAN) 80 MG tablet Take 80 mg by mouth daily. 10/21/18   [provider]  vitamin E 180 MG (400 UNITS) capsule Take 400 Units by mouth daily.    [provider]    Allergies    Pravachol [pravastatin] and Prednisone  Review of Systems   Review of Systems 10 Systems reviewed and are negative for acute change except as noted in the HPI. Physical Exam Updated Vital Signs BP (!) 107/45 (BP Location: Left Arm)   Pulse 76   Temp (!) 97.5 F (36.4 C) (Oral)   Resp 16   SpO2 99%   Physical Exam Constitutional:      Comments: Alert nontoxic.  Clinically well in appearance.  Well-nourished well-developed.  No respiratory distress.  HENT:     Head: Normocephalic and atraumatic.     Mouth/Throat:     Mouth: Mucous membranes are moist.     Pharynx: Oropharynx is clear.  Eyes:     Extraocular Movements: Extraocular movements intact.     Conjunctiva/sclera: Conjunctivae normal.  Cardiovascular:     Rate and Rhythm: Normal rate and regular rhythm.     Pulses: Normal pulses.     Heart sounds: Normal heart sounds.  Pulmonary:     Comments: No respiratory distress.  Patient does have some diminished breath sounds and wheeze in the right midlung field around surgical site.  Airflow present.  There are surgical incision sites from the posterior thoracic to the anterior thoracic chest wall in the right.  These are clean dry and intact.  No swelling or drainage around them. Abdominal:     General: There is no distension.     Palpations: Abdomen is soft.     Tenderness: There is no abdominal tenderness. There is no guarding.  Musculoskeletal:         General: No swelling or tenderness. Normal range of motion.     Cervical back: Neck supple.     Right lower leg: No edema.     Left lower leg: No edema.  Skin:    General: Skin is warm and dry.  Neurological:     General: No focal deficit present.     Mental Status: She is oriented to person, place, and time.     Coordination: Coordination normal.  Psychiatric:        Mood and Affect: Mood normal.     ED Results / Procedures / Treatments   Labs (all labs ordered are listed, but only abnormal results are displayed) Labs Reviewed  CBG MONITORING, ED - Abnormal; Notable for the following components:      Result Value   Glucose-Capillary 132 (*)    All other components within normal limits  BASIC METABOLIC PANEL  CBC  URINALYSIS, ROUTINE W REFLEX MICROSCOPIC  CBG MONITORING, ED    EKG EKG Interpretation  Date/Time:  Friday October 02 2019 15:17:44 EDT Ventricular Rate:  73 PR Interval:  138 QRS Duration: 76 QT Interval:  412 QTC Calculation: 453 R Axis:   51 Text Interpretation: Sinus rhythm with Premature supraventricular complexes Nonspecific T wave abnormality Abnormal ECG Confirmed by Charlesetta Shanks [phone]) on 10/02/2019 7:22:40 PM   Radiology No results found.  Procedures Procedures (including critical care time)  Medications Ordered in ED Medications  sodium chloride flush (NS) 0.9 % injection 3 mL (has no administration in time range)    ED Course  I have reviewed the triage vital signs and the nursing notes.  Pertinent labs & imaging results that were available during my care of the patient were reviewed by me and considered in my medical decision making (see chart for details).    MDM Rules/Calculators/A&P                     Patient presents from cardiothoracic outpatient follow-up visit after right partial lobectomy.  This x-ray stable without any appearance of significant pneumothorax.  Small apical pneumo identified.  Patient does not appear to have  infectious etiology.  No fevers and Final Clinical Impression(s) / ED Diagnoses Final diagnoses:  Vasovagal syncope  Postoperative pain    Rx / DC Orders ED Discharge Orders    None       Charlesetta Shanks, MD 10/07/19 0004

## 2019-10-02 NOTE — Progress Notes (Signed)
      [address]       [place] [address]             [phone]        Joan Yaritsa Tinsley Sitter [facility] Medical Record [id number] Date of Birth: 01-04-1946  Referring: Garner Nash, DO Primary Care: Crist Infante, MD Primary Cardiologist:No primary care provider on file.  Reason for visit:   follow-up  History of Present Illness:     Tina Erickson presented today in follow-up.  She made it from the waiting area without difficulty but once in the room she had a presyncopal episode.  She never lost consciousness but her blood pressure was extremely low.  As she was rehydrated her blood pressure did come up.  Physical Exam: BP 120/74   Pulse 80   Temp 97.7 F (36.5 C) (Skin)   Resp 20   Ht 5\' 3"  (1.6 m)   Wt 124 lb 9.6 oz (56.5 kg)   SpO2 93% Comment: RA  BMI 22.07 kg/m   Alert NAD Incision clean.  Stitch removed  Clear breath sounds bilaterally    Pathology showed necrotizing granulomatous inflammation    Assessment / Plan:   74 year old status post right robotic assisted thoracoscopy with right middle lobectomy.  The pathology did not show any malignancy was consistent with necrotizing granulomatous infection.  Final cultures are still pending.  Given her syncopal episode EMS was called and she was taken to the ED for further evaluation.   Lajuana Matte 10/02/2019 4:31 PM

## 2019-10-05 ENCOUNTER — Other Ambulatory Visit: Payer: Self-pay

## 2019-10-05 ENCOUNTER — Encounter: Payer: Self-pay | Admitting: Pulmonary Disease

## 2019-10-05 ENCOUNTER — Ambulatory Visit: Payer: Medicare Other | Admitting: Pulmonary Disease

## 2019-10-05 VITALS — BP 122/68 | HR 74 | Temp 97.2°F | Ht 63.0 in | Wt 125.0 lb

## 2019-10-05 DIAGNOSIS — J852 Abscess of lung without pneumonia: Secondary | ICD-10-CM | POA: Diagnosis not present

## 2019-10-05 DIAGNOSIS — J189 Pneumonia, unspecified organism: Secondary | ICD-10-CM

## 2019-10-05 LAB — AEROBIC/ANAEROBIC CULTURE W GRAM STAIN (SURGICAL/DEEP WOUND)

## 2019-10-05 MED ORDER — AMOXICILLIN-POT CLAVULANATE 875-125 MG PO TABS: 1.0000 | ORAL_TABLET | Freq: Two times a day (BID) | ORAL | 0 refills | Status: DC

## 2019-10-05 NOTE — Patient Instructions (Addendum)
Right middle lobe lung nodule with necrotizing granulomatous inflammation (INFECTION) Obtain ANA, ANCA  START Augmentin for four weeks Obtain CT Chest without contrast 6 weeks  Pain Regimen Take ONE tablet of Percocet 5-325 mg TWICE a day. Once in the morning and the evening Take TWO tablets of Tylenol 500 mg strength every 8 hours daily. Ok to cut back after a week Continue incentive spirometry for goal of 1500-2000 ml  Follow-up NP after CT Chest

## 2019-10-05 NOTE — Progress Notes (Signed)
Subjective:   PATIENT ID: Tina Erickson GENDER: female DOB: 1946-05-05, MRN: [id number]   HPI  Chief Complaint  Patient presents with  . Follow-up    Patient reports that she has some pain post surgery on her right side.     Reason for Visit: Follow-up  [name] is 74 year old never smoker with gastritis, HTN and HLD who presents for follow-up. Husband present and also presented history as noted below.  Synopsis: Initially seen for consult for incidental RML tree-in-bud opacity seen on CT abdomen during work-up for epigastric pain. Repeat CXR 01/05/19 was concerning for right infrahilar mass. CT Chest was ordered and she was referred to Pulmonary.   Since our last visit, we obtained a follow-up CT chest after a course of antibiotics that noted increased bulkiness of RML lung nodule concerning for infection vs malignancy. We discussed tissue sampling and arranged for pre-op PFTs and PET scan. On 4/5, she underwent bronchoscopy, robotic assisted right thoracoscopy, right lobectomy, lymph node sampling. No evidence of malignancy however pathology showed necrotizing granulomatous inflammation. She reports post-op chest pain but still able to do incentive spirometry up to 1200cc daily. She has limited her percocet to once a day at night and only taking tylenol as needed however does not feel like this controls her pain.  She has limited her activity and inquires what is acceptable in terms of lifting objects and ambulation. She is no longer wearing compression stockings and denies lower extremity edema. She denies fevers, chills, cough, shortness of breath. Denies dysphagia, aspiration or coughing spells with eating.  Social History: Never smoker  Environmental exposures: None  I have personally reviewed patient's past medical/family/social history/allergies/current medications.  Past Medical History:  Diagnosis Date  . Acid reflux    just occasionally  . Arthritis    at the base of my spine  . Gastritis   . Hyperlipidemia   . Hypertension   . Pancreatitis      Outpatient Medications Prior to Visit  Medication Sig Dispense Refill  . Apoaequorin (PREVAGEN PO) Take 1 tablet by mouth daily.     Marland Kitchen co-enzyme Q-10 50 MG capsule Take 50 mg by mouth daily.    Marland Kitchen esomeprazole (NEXIUM) 40 MG capsule Take 40 mg by mouth daily.     Marland Kitchen KRILL OIL PO Take 1 capsule by mouth daily.    . milk thistle 175 MG tablet Take 175 mg by mouth daily.     Marland Kitchen oxyCODONE-acetaminophen (PERCOCET) 5-325 MG tablet Take 1-2 tablets by mouth every 6 (six) hours as needed. 20 tablet 0  . rosuvastatin (CRESTOR) 20 MG tablet Take 1 tablet (20 mg total) by mouth at bedtime. 90 tablet 3  . sucralfate (CARAFATE) 1 g tablet Take 1 tablet by mouth daily as needed (acid reflux).     . traMADol (ULTRAM) 50 MG tablet Take 1 tablet (50 mg total) by mouth every 6 (six) hours as needed (mild pain). 28 tablet 0  . triamcinolone cream (KENALOG) 0.1 % Apply 1 application topically 2 (two) times daily as needed (itching).    . valsartan (DIOVAN) 80 MG tablet Take 80 mg by mouth daily.    . vitamin E 180 MG (400 UNITS) capsule Take 400 Units by mouth daily.     No facility-administered medications prior to visit.    Review of Systems  Constitutional: Negative for chills, diaphoresis, fever, malaise/fatigue and weight loss.  HENT: Negative for congestion.   Respiratory: Negative for  cough, hemoptysis, sputum production, shortness of breath and wheezing.   Cardiovascular: Positive for chest pain. Negative for palpitations and leg swelling.    Objective:   Vitals:   10/05/19 1042  BP: 122/68  Pulse: 74  Temp: (!) 97.2 F (36.2 C)  TempSrc: Temporal  SpO2: 96%  Weight: 125 lb (56.7 kg)  Height: 5\' 3"  (1.6 m)   SpO2: 96 % O2 Device: None (Room air)   Physical Exam: General: Well-appearing, no acute distress HENT: [place], AT Eyes: EOMI, no scleral icterus Respiratory: Clear to auscultation  bilaterally.  No crackles, wheezing or rales Cardiovascular: RRR, -M/R/G, no JVD GI: BS+, soft, nontender Extremities:-Edema,-tenderness Neuro: AAO x4, CNII-XII grossly intact Skin: Intact, no rashes or bruising Psych: Normal mood, normal affect  Data Reviewed:  Imaging: CT Abdomen 11/13/18-mild RML tree in bud opacity CT Chest 01/09/19- scattered focal areas of tree-in-bud opacities in lower lobes bilaterally. RML bronchocele. Posterior opacity in RML, 36mm RLL nodule: recommended follow-up CT Chest 09/04/19 - Increased volume of RML spiculated nodule from 2.6 by 2.4 by 0.7 cm to 3.0 by 2.5 by 0.9 cm. Unchanged scattered tree-in-bud opacities and right lung nodules. PET 123XX123 - Hypermetabolic 2.9 cm posterior RML nodule adjacent to the right major fissure, increased in size. SUV 7.9. No mediastinal adenopathy. No mets present  PFT: 09/11/19 FVC 3.2 (119%) FEV1 2.56 (127%) Ratio 79  TLC 110% DLCO 77% Interpretation: No evidence of obstructive or restrictive defect. No significant bronchodilator response. F-V curves normal. Mildly reduced gas exchange however this is uncorrected without hemoglobin.   RML Surgical Path 09/28/19 FINAL MICROSCOPIC DIAGNOSIS:   A. LUNG, RIGHT MIDDLE LOBE, RESECTION:  - Necrotizing granulomatous inflammation. See comment  - Incidental carcinoid tumorlet, 0.3 cm  - No evidence of malignancy   B. LYMPH NODE, LEVEL 7, EXCISION:  - Benign reactive lymph node  - Negative for granulomas or malignancy   C. LYMPH NODE, LEVEL 12R, EXCISION:  - Benign reactive lymph node  - Negative for granulomas or malignancy   D. LYMPH NODE, LEVEL 12R #2, EXCISION:  - Benign reactive lymph node  - Negative for granulomas or malignancy   E. LYMPH NODE, LEVEL 11, EXCISION:  - Benign reactive lymph node  - Negative for granulomas or malignancy   F. LYMPH NODE, LEVEL 11R #2, EXCISION:  - Benign reactive lymph node  - Negative for granulomas or malignancy   G. LYMPH NODE,  LEVEL 11R #3, EXCISION:  - Benign reactive lymph node  - Negative for granulomas ormalignancy    COMMENT:   Differential diagnosis for granulomatous inflammation primarily includes  infection and sarcoidosis. AFB and GMS special stains are negative for  acid-fast bacilli and fungal organisms respectively. Immunohistochemical  stain for synaptophysin highlights the carcinoid tumorlet.  Imaging, labs and tests noted above have been reviewed independently by me.    Assessment & Plan:   Discussion: 74 year old never smoker with HTN and HLD with RML lung nodule s/p RML resection. Pathology demonstrated necrotizing granulomatous inflammation which can include a differential of infection, inflammatory vasculitis or necrotizing sarcoid. In the absence of respiratory symptoms, no indication for treatment at this time. Will need to follow-up final cultures for causal organism if this is infection however a majority of cases will not identify a bacterial agent. Often the cause of granulomatous inflammation remains unknown if work-up is negative. Will plan to treat like lung abscess and rule out vasculitis (low suspicion). We discussed pain regimen as noted below. Encouraged regular aerobic  activity as tolerated and continue using incentive spirometry.  Right middle lobe lung nodule with necrotizing granulomatous inflammation (INFECTION) Obtain ANA, ANCA  START Augmentin for 4 weeks to treat as presumed lung abscess Obtain CT Chest without contrast 6 weeks Follow-up with NP  Pain Regimen Take ONE tablet of Percocet 5-325 mg TWICE a day. Once in the morning and the evening Take TWO tablets of Tylenol 500 mg strength every 8 hours daily. Ok to cut back after a week Continue incentive spirometry for goal of 1500-2000 ml  Follow-up NP after CT Chest  Bilateral lung nodules <51mm, subsolid CT imaging as below  Health Maintenance Immunization History  Administered Date(s) Administered  .  Influenza Split 04/16/2016    CT Lung Screen - not qualified  Orders Placed This Encounter  Procedures  . CT Chest Wo Contrast    Standing Status:   Future    Standing Expiration Date:   12/04/2020    Scheduling Instructions:     Please schedule in 6 weeks    Order Specific Question:   Preferred imaging location?    Answer:   [facility]    Order Specific Question:   Radiology Contrast Protocol - do NOT remove file path    Answer:   [email or web address]  . ANA w/Reflex    Standing Status:   Future    Number of Occurrences:   1    Standing Expiration Date:   10/04/2020  . ANCA screen with reflex titer    Standing Status:   Future    Number of Occurrences:   1    Standing Expiration Date:   10/04/2020   Meds ordered this encounter  Medications  . amoxicillin-clavulanate (AUGMENTIN) 875-125 MG tablet    Sig: Take 1 tablet by mouth 2 (two) times daily.    Dispense:  60 tablet    Refill:  0    Dx: J85.2    Return in about 6 weeks (around 11/16/2019) for with NP after CT Chest.  I have spent a total time of 42-minutes on the day of the appointment reviewing prior documentation, coordinating care and discussing medical diagnosis and plan with the patient/family. Imaging, labs and tests included in this note have been reviewed and interpreted independently by me.   [place], MD [facility] 10/05/2019 12:01 PM  Office Number [phone]

## 2019-10-06 LAB — ANCA SCREEN W REFLEX TITER: ANCA Screen: NEGATIVE

## 2019-10-06 LAB — ANA W/REFLEX: Anti Nuclear Antibody (ANA): NEGATIVE

## 2019-10-08 ENCOUNTER — Telehealth: Payer: Self-pay | Admitting: Pulmonary Disease

## 2019-10-08 NOTE — Telephone Encounter (Signed)
Spoke with pt. States that she is having issues with pain her back and chest. At her last OV, Dr. Loanne Drilling started her on Augmentin. Pt has developed diarrhea since starting this. Stated, "I just want this pain to stop." She has been taking Percocet BID and Tylenol every 8 hours per Dr. Cordelia Pen instructions. Pt would like recommendations on what to do.  Aaron Edelman - please advise. Thanks.

## 2019-10-08 NOTE — Telephone Encounter (Signed)
Spoke with pt. She is aware of Brian's response. Pt has been scheduled to see Beth tomorrow at 1100. Nothing further needed.

## 2019-10-08 NOTE — Telephone Encounter (Signed)
10/08/2019  The patient is continuing to have ongoing pain in her back as well as her chest and she will need to present to further evaluation.  She is currently receiving Augmentin for her presumed leg abscess.  She has a scheduled follow-up CT in May/2021.  It sounds like based off of this this is not slightly we can manage telephonically and that she will need to seek emergent care.  Please route this message to Dr. Loanne Drilling as [facility] [place].  Would also likely be beneficial to schedule closer follow-up with Dr. Loanne Drilling if patient is having to present to the emergency room.  Wyn Quaker, FNP

## 2019-10-09 ENCOUNTER — Ambulatory Visit: Payer: Medicare Other | Admitting: Primary Care

## 2019-10-09 ENCOUNTER — Encounter: Payer: Self-pay | Admitting: Primary Care

## 2019-10-09 ENCOUNTER — Ambulatory Visit (INDEPENDENT_AMBULATORY_CARE_PROVIDER_SITE_OTHER): Payer: Medicare Other

## 2019-10-09 ENCOUNTER — Other Ambulatory Visit: Payer: Self-pay

## 2019-10-09 VITALS — BP 100/64 | HR 77 | Temp 97.5°F | Ht 63.0 in | Wt 123.0 lb

## 2019-10-09 DIAGNOSIS — J852 Abscess of lung without pneumonia: Secondary | ICD-10-CM | POA: Diagnosis not present

## 2019-10-09 DIAGNOSIS — J9811 Atelectasis: Secondary | ICD-10-CM | POA: Diagnosis not present

## 2019-10-09 DIAGNOSIS — J90 Pleural effusion, not elsewhere classified: Secondary | ICD-10-CM | POA: Diagnosis not present

## 2019-10-09 DIAGNOSIS — R911 Solitary pulmonary nodule: Secondary | ICD-10-CM

## 2019-10-09 MED ORDER — PROBIOTIC 250 MG PO CAPS: 1.0000 | ORAL_CAPSULE | Freq: Two times a day (BID) | ORAL | 0 refills | Status: AC

## 2019-10-09 MED ORDER — OXYCODONE-ACETAMINOPHEN 5-325 MG PO TABS: 1.0000 | ORAL_TABLET | Freq: Four times a day (QID) | ORAL | 0 refills | Status: AC | PRN

## 2019-10-09 NOTE — Progress Notes (Signed)
@Patient  ID: Tina Erickson, female    DOB: [birth year], 74 y.o.   MRN: [id number]  Chief Complaint  Patient presents with   Follow-up    f/u right chest pain.     Referring provider: Crist Infante, MD  Synopsis: Initially seen for consult for incidental RML tree-in-bud opacity seen on CT abdomen during work-up for epigastric pain. Repeat CXR 01/05/19 was concerning for right infrahilar mass. CT Chest was ordered and she was referred to Pulmonary.   HPI: 74 year old female, never smoked.  Past medical history significant for right middle lobe lung nodule.  Patient of Dr. Loanne Drilling, most recently seen 10/05/19. S/p RML resection, pathology demonstrated necrotizing granulomatous inflammation presumed lung abscess started on Augmentin for 4 weeks.  Rx Percocet 5-3 25mg  BID as needed for pain. Continue IS, goal 1500-2027ml. Plan to obtain chest CT in 6 weeks.  Previous [facility] pulmonary encounter: 10/05/19- Dr. Loanne Drilling Since our last visit, we obtained a follow-up CT chest after a course of antibiotics that noted increased bulkiness of RML lung nodule concerning for infection vs malignancy. We discussed tissue sampling and arranged for pre-op PFTs and PET scan. On 4/5, she underwent bronchoscopy,robotic assisted right thoracoscopy, right lobectomy, lymph node sampling. No evidence of malignancy however pathology showed necrotizing granulomatous inflammation. She reports post-op chest pain but still able to do incentive spirometry up to 1200cc daily. She has limited her percocet to once a day at night and only taking tylenol as needed however does not feel like this controls her pain.  She has limited her activity and inquires what is acceptable in terms of lifting objects and ambulation. She is no longer wearing compression stockings and denies lower extremity edema. She denies fevers, chills, cough, shortness of breath. Denies dysphagia, aspiration or coughing spells with  eating.  10/09/2019 Patient presents today for acute visit.  Reports moderate right sided pleuritic pain since robotic assisted right thoracoscopy on 09/28/19. She was started on Augmentin for presumed lung abscess on 4/12 and given prescription for percocet. States that she only took two doses and reports developing nausea and loose stools (no diarrhea). She has not taking her antibiotic since Tuesday. She has been taking Percocet 5-325mg  two tablets twice daily and Tylenol every 8 hours for pain since Monday. Her husband states that she drinks a lot of milk, he also suggested she may benefit from holding her egular vitamins/supplements for the next week or two.   Allergies  Allergen Reactions   Pravachol [Pravastatin] Other (See Comments)    Thought it affected her memory and thinking   Prednisone Other (See Comments)    Pt. Stated "it made me crazy"    Immunization History  Administered Date(s) Administered   Influenza Split 04/16/2016    Past Medical History:  Diagnosis Date   Acid reflux    just occasionally   Arthritis    at the base of my spine   Gastritis    Hyperlipidemia    Hypertension    Pancreatitis     Tobacco History: Social History   Tobacco Use  Smoking Status Never Smoker  Smokeless Tobacco Never Used   Counseling given: Not Answered   Outpatient Medications Prior to Visit  Medication Sig Dispense Refill   Apoaequorin (PREVAGEN PO) Take 1 tablet by mouth daily.      co-enzyme Q-10 50 MG capsule Take 50 mg by mouth daily.     esomeprazole (NEXIUM) 40 MG capsule Take 40 mg by mouth daily.  KRILL OIL PO Take 1 capsule by mouth daily.     rosuvastatin (CRESTOR) 20 MG tablet Take 1 tablet (20 mg total) by mouth at bedtime. 90 tablet 3   sucralfate (CARAFATE) 1 g tablet Take 1 tablet by mouth daily as needed (acid reflux).      triamcinolone cream (KENALOG) 0.1 % Apply 1 application topically 2 (two) times daily as needed (itching).      vitamin E 180 MG (400 UNITS) capsule Take 400 Units by mouth daily.     oxyCODONE-acetaminophen (PERCOCET) 5-325 MG tablet Take 1-2 tablets by mouth every 6 (six) hours as needed. 20 tablet 0   amoxicillin-clavulanate (AUGMENTIN) 875-125 MG tablet Take 1 tablet by mouth 2 (two) times daily. (Patient not taking: Reported on 10/09/2019) 60 tablet 0   milk thistle 175 MG tablet Take 175 mg by mouth daily.      valsartan (DIOVAN) 80 MG tablet Take 80 mg by mouth daily.     traMADol (ULTRAM) 50 MG tablet Take 1 tablet (50 mg total) by mouth every 6 (six) hours as needed (mild pain). (Patient not taking: Reported on 10/09/2019) 28 tablet 0   No facility-administered medications prior to visit.    Review of Systems  Review of Systems  Constitutional: Negative.   Respiratory: Negative for cough and shortness of breath.   Cardiovascular:       Right sided pleuritic/incisional pain     Physical Exam  BP 100/64 (BP Location: Left Arm, Patient Position: Sitting, Cuff Size: Normal)    Pulse 77    Temp (!) 97.5 F (36.4 C) (Temporal)    Ht 5\' 3"  (1.6 m)    Wt 123 lb (55.8 kg)    SpO2 98% Comment: room air   BMI 21.79 kg/m  Physical Exam Constitutional:      Appearance: Normal appearance.  HENT:     Head: Normocephalic and atraumatic.  Cardiovascular:     Rate and Rhythm: Normal rate and regular rhythm.  Pulmonary:     Breath sounds: No wheezing.     Comments: Right lung base diminished Musculoskeletal:        General: Normal range of motion.  Skin:    General: Skin is warm and dry.     Comments: Inc x3 right flank OTA CDI. No drainage. Minimal surrounding erythema.  Neurological:     General: No focal deficit present.     Mental Status: She is alert and oriented to person, place, and time. Mental status is at baseline.  Psychiatric:        Mood and Affect: Mood normal.        Behavior: Behavior normal.        Thought Content: Thought content normal.        Judgment: Judgment  normal.      Lab Results:  CBC    Component Value Date/Time   WBC 11.1 (H) 10/02/2019 1525   RBC 3.84 (L) 10/02/2019 1525   HGB 11.3 (L) 10/02/2019 1525   HCT 35.6 (L) 10/02/2019 1525   PLT 294 10/02/2019 1525   MCV 92.7 10/02/2019 1525   MCH 29.4 10/02/2019 1525   MCHC 31.7 10/02/2019 1525   RDW 12.9 10/02/2019 1525    BMET    Component Value Date/Time   NA 136 10/02/2019 1525   K 3.5 10/02/2019 1525   CL 101 10/02/2019 1525   CO2 24 10/02/2019 1525   GLUCOSE 156 (H) 10/02/2019 1525   BUN 9 10/02/2019 1525  CREATININE 0.78 10/02/2019 1525   CALCIUM 8.8 (L) 10/02/2019 1525   GFRNONAA >60 10/02/2019 1525   GFRAA >60 10/02/2019 1525    BNP No results found for: BNP  ProBNP No results found for: PROBNP  Imaging: DG Chest 2 View  Result Date: 10/09/2019 CLINICAL DATA:  Right-sided pleuritic pain. EXAM: CHEST - 2 VIEW COMPARISON:  October 02, 2019. FINDINGS: The heart size and mediastinal contours are within normal limits. No pneumothorax is noted. Left lung is clear. Small right pleural effusion is noted. Postsurgical changes are noted in the right hilar region with associated scarring. This is unchanged compared to prior exam. Mild right lower lobe atelectasis is noted. The visualized skeletal structures are unremarkable. IMPRESSION: Stable postsurgical changes seen involving right lung. Small right pleural effusion is noted. Mild right lower lobe atelectasis is noted. Electronically Signed   By: Marijo Conception M.D.   On: 10/09/2019 12:37   DG Chest 2 View  Result Date: 09/28/2019 CLINICAL DATA:  Preop for right lung surgery. Right middle lobe lung lesion. EXAM: CHEST - 2 VIEW COMPARISON:  Chest x-ray 01/05/2019 and PET-CT 09/04/2019 FINDINGS: The cardiac silhouette, mediastinal and hilar contours are within normal limits and stable. Underlying emphysematous changes with hyperinflation and areas of pulmonary scarring. Stable right middle lobe lung lesions. No pleural  effusion. The bony thorax is intact. IMPRESSION: Emphysematous changes and pulmonary scarring but no acute overlying pulmonary process. Stable right middle lobe lung lesions. Electronically Signed   By: Marijo Sanes M.D.   On: 09/28/2019 06:20   NM PET Image Initial (PI) Skull Base To Thigh  Result Date: 09/15/2019 CLINICAL DATA:  Initial treatment strategy for enlarging right middle lobe pulmonary nodule. EXAM: NUCLEAR MEDICINE PET SKULL BASE TO THIGH TECHNIQUE: 6.1 mCi F-18 FDG was injected intravenously. Full-ring PET imaging was performed from the skull base to thigh after the radiotracer. CT data was obtained and used for attenuation correction and anatomic localization. Fasting blood glucose: 78 mg/dl COMPARISON:  09/04/2019 chest CT. FINDINGS: Mediastinal blood pool activity: SUV max 2.5 Liver activity: SUV max NA NECK: No hypermetabolic lymph nodes in the neck. Incidental CT findings: none CHEST: No hypermetabolic axillary, mediastinal or hilar lymph nodes. Hypermetabolic irregular peripheral posterior right middle lobe 2.9 x 2.2 cm pulmonary nodule with max SUV 7.9 (series 8/image 42), abutting and distorting the major fissure, stable since 09/04/2019 chest CT, increased from 2.5 x 2.0 cm on 03/05/2019 chest CT. Mild hypermetabolism associated with patchy chronic tree-in-bud opacities in the anterior right middle lobe with max SUV 2.9 (series 8/image 49), not substantially changed since 03/05/2019 chest CT. No additional hypermetabolic pulmonary findings. A few additional scattered subcentimeter solid pulmonary nodules and patchy mild regions of tree-in-bud opacity in both lungs, largest 0.9 cm in the left lower lobe (series 8/image 45), without significant metabolic activity. Incidental CT findings: Atherosclerotic nonaneurysmal thoracic aorta. ABDOMEN/PELVIS: No abnormal hypermetabolic activity within the liver, pancreas, adrenal glands, or spleen. No hypermetabolic lymph nodes in the abdomen or  pelvis. Incidental CT findings: Apparent gas within the nondistended gallbladder (series 4/image 114). Atherosclerotic nonaneurysmal abdominal aorta. Mild sigmoid diverticulosis. SKELETON: No focal hypermetabolic activity to suggest skeletal metastasis. Incidental CT findings: none IMPRESSION: 1. Hypermetabolic (max SUV 7.9) irregular 2.9 cm posterior right middle lobe pulmonary nodule abutting and distorting the right major fissure, increased in size since 03/05/2019 chest CT. Primary bronchogenic carcinoma to be excluded, although differential includes progressive chronic infection such as due to atypical mycobacterial infection (MAI) given mild patchy  chronic tree-in-bud opacities in both lungs. Tissue sampling suggested. 2. No hypermetabolic thoracic adenopathy or distant metastatic disease. 3. Apparent gas within the nondistended gallbladder, poorly evaluated on these motion degraded noncontrast CT images, potentially artifactual. Consider dedicated CT abdomen/pelvis with oral and IV contrast for further evaluation, if clinically warranted. 4.  Aortic Atherosclerosis (ICD10-I70.0). Electronically Signed   By: Ilona Sorrel M.D.   On: 09/15/2019 15:27   DG Chest Port 1 View  Result Date: 10/02/2019 CLINICAL DATA:  Syncopal episode. Lobectomy 4 days ago. EXAM: PORTABLE CHEST 1 VIEW COMPARISON:  Radiographs 09/29/2019. CT 09/04/2019. FINDINGS: 1634 hours. Stable postsurgical changes at the right hilum related to recent middle lobe resection. Stable small right apical pneumothorax and mild blunting of the right costophrenic angle. The left lung is clear. The heart size and mediastinal contours are stable. No acute osseous findings. Telemetry leads overlie the chest. IMPRESSION: Stable small right apical pneumothorax and postsurgical changes at the right hilum. Electronically Signed   By: Richardean Sale M.D.   On: 10/02/2019 16:44   DG Chest Port 1 View  Result Date: 09/29/2019 CLINICAL DATA:  History of  pneumothorax. EXAM: PORTABLE CHEST 1 VIEW COMPARISON:  Prior chest x-ray same day. FINDINGS: Mediastinum and hilar structures normal. Heart size normal. Postsurgical changes right lung. Interim removal of right chest tube. Small right apical pneumothorax noted. Tiny right pleural effusion noted. IMPRESSION: Interim removal of right chest tube. Tiny right apical pneumothorax noted. Critical Value/emergent results were called by telephone at the time of interpretation on 09/29/2019 at 1:20 pm to nurse Alyse Low , who verbally acknowledged these results. Electronically Signed   By: Marcello Moores  Register   On: 09/29/2019 13:23   DG CHEST PORT 1 VIEW  Result Date: 09/29/2019 CLINICAL DATA:  Status post right middle lobe lobectomy. EXAM: PORTABLE CHEST 1 VIEW COMPARISON:  Chest x-ray 09/28/2019 FINDINGS: Stable right-sided chest tube. No definite pneumothorax. The cardiac silhouette, mediastinal and hilar contours are within normal limits. Minimal streaky subsegmental atelectasis in the right lung. No pleural effusion. IMPRESSION: Stable right-sided chest tube without definite pneumothorax. Electronically Signed   By: Marijo Sanes M.D.   On: 09/29/2019 08:46   DG Chest Port 1 View  Result Date: 09/28/2019 CLINICAL DATA:  Status post right lobectomy. EXAM: PORTABLE CHEST 1 VIEW COMPARISON:  Same day. FINDINGS: Stable cardiomediastinal silhouette. Right-sided chest tube is noted without evidence of pneumothorax. Minimal right basilar subsegmental atelectasis is noted. Left lung is clear. Bony thorax is unremarkable. IMPRESSION: Right-sided chest tube is noted without evidence of pneumothorax. Minimal right basilar subsegmental atelectasis. Electronically Signed   By: Marijo Conception M.D.   On: 09/28/2019 13:43     Assessment & Plan:   Abscess of middle lobe of right lung without pneumonia ([place]) - Reports right sided pleuritic/incisional pain  - CXR today reassuring, normal post surgical changes right lower lobe. Small  pleural effusion and atelectasis. No pneumothorax.  - Advised patient to resume Augmentin as instructed with food (sending in probiotic to take twice daily to help minimize diarrhea and nausea associated with antibiotic) - Pain control with Percocet 5-325mg  1-2 tabs every 6 hours (PDMP reviewed, overdose risk score zero. Refill ok, husband is monitoring use and advised taper use over the next week if tolerates) - Encourage deep breathing exeresis every hour. Ok to use warm compress to site as needed  Spent >30 min face to face with patient   Martyn Ehrich, NP 10/09/2019

## 2019-10-09 NOTE — Patient Instructions (Addendum)
Take nexium daily before meal on empty stomach   Take antibiotic twice daily with food   Take pain medication every 6-8 hours with a snack for pain  Use heating pad and Incentive spirometer  Starting probiotic daily

## 2019-10-09 NOTE — Assessment & Plan Note (Addendum)
-   Reports right sided pleuritic/incisional pain  - CXR today reassuring, normal post surgical changes right lower lobe. Small pleural effusion and atelectasis. No pneumothorax.  - Advised patient to resume Augmentin as instructed with food (sending in probiotic to take twice daily to help minimize diarrhea and nausea associated with antibiotic) - Pain control with Percocet 5-325mg  1-2 tabs every 6 hours (PDMP reviewed, overdose risk score zero. Refill ok, husband is monitoring use and advised taper use over the next week if tolerates) - Encourage deep breathing exeresis every hour. Ok to use warm compress to site as needed

## 2019-10-09 NOTE — Progress Notes (Signed)
Patient identification verified. Results of recent ANCA screen reviewed. Per Dr. Rodman Pickle, labs are normal, please continue taking antibiotics as prescribed. Patient verbalized understanding of results and plan of care.

## 2019-10-16 ENCOUNTER — Telehealth: Payer: Self-pay | Admitting: Pulmonary Disease

## 2019-10-16 NOTE — Telephone Encounter (Signed)
Attempted to call pt but unable to reach. Left message for pt to return call. 

## 2019-10-19 NOTE — Telephone Encounter (Signed)
Patient is returning phone call. Patient phone number is [phone].

## 2019-10-19 NOTE — Telephone Encounter (Signed)
Spoke with the pt  She states  BP low after having surgery  She wants to know if she should take her BP meds  I advised her to call her BP who manages her BP to as about this  Pt verbalized understanding

## 2019-10-21 ENCOUNTER — Telehealth: Payer: Self-pay | Admitting: Pulmonary Disease

## 2019-10-21 NOTE — Telephone Encounter (Signed)
Left message for patient to call back  

## 2019-10-21 NOTE — Telephone Encounter (Signed)
ATC patient LMTCB X1. Looks like Dr. Kipp Brood prescribed medication for her so she may need to call them. Will wait for call back to get clarification.

## 2019-10-21 NOTE — Telephone Encounter (Signed)
She needs to transition to tramadol for pain control, we can refill that if needed. Use what she has left of percocet at bedtime only. Cc: Dr. Loanne Drilling

## 2019-10-21 NOTE — Telephone Encounter (Signed)
Patient called back and is requesting a refill on the oxyCODONE-acetaminophen (PERCOCET) 5-325 MG tablet [id number]  She states that she only has 5 left and is still having pain. Patient also told me that she has 24- Tramadol 50mg  left.  Beth please advise

## 2019-10-22 NOTE — Telephone Encounter (Signed)
Called and spoke with patient to let her know that she needs to transition to tramadol and only use percocet at night. Patient expressed understanding. Nothing further needed at this time.

## 2019-10-29 DIAGNOSIS — K5900 Constipation, unspecified: Secondary | ICD-10-CM | POA: Diagnosis not present

## 2019-10-29 DIAGNOSIS — R109 Unspecified abdominal pain: Secondary | ICD-10-CM | POA: Diagnosis not present

## 2019-11-13 DIAGNOSIS — K5900 Constipation, unspecified: Secondary | ICD-10-CM | POA: Insufficient documentation

## 2019-11-13 DIAGNOSIS — R102 Pelvic and perineal pain: Secondary | ICD-10-CM | POA: Diagnosis not present

## 2019-11-13 DIAGNOSIS — B373 Candidiasis of vulva and vagina: Secondary | ICD-10-CM | POA: Diagnosis not present

## 2019-11-13 DIAGNOSIS — R159 Full incontinence of feces: Secondary | ICD-10-CM | POA: Diagnosis not present

## 2019-11-16 ENCOUNTER — Ambulatory Visit (INDEPENDENT_AMBULATORY_CARE_PROVIDER_SITE_OTHER)
Admission: RE | Admit: 2019-11-16 | Discharge: 2019-11-16 | Disposition: A | Discharge status: Home / Self Care | Payer: Medicare Other | Source: Ambulatory Visit | Attending: Pulmonary Disease | Admitting: Pulmonary Disease

## 2019-11-16 ENCOUNTER — Other Ambulatory Visit: Payer: Self-pay

## 2019-11-16 DIAGNOSIS — J852 Abscess of lung without pneumonia: Secondary | ICD-10-CM

## 2019-11-16 DIAGNOSIS — R918 Other nonspecific abnormal finding of lung field: Secondary | ICD-10-CM | POA: Diagnosis not present

## 2019-11-16 DIAGNOSIS — J189 Pneumonia, unspecified organism: Secondary | ICD-10-CM | POA: Diagnosis not present

## 2019-11-24 ENCOUNTER — Ambulatory Visit: Payer: Medicare Other | Admitting: Primary Care

## 2019-11-24 ENCOUNTER — Other Ambulatory Visit: Payer: Self-pay

## 2019-11-24 ENCOUNTER — Telehealth: Payer: Self-pay | Admitting: Pulmonary Disease

## 2019-11-24 ENCOUNTER — Encounter: Payer: Self-pay | Admitting: Primary Care

## 2019-11-24 VITALS — BP 118/76 | HR 82 | Temp 98.7°F | Ht 63.0 in | Wt 119.8 lb

## 2019-11-24 DIAGNOSIS — J852 Abscess of lung without pneumonia: Secondary | ICD-10-CM

## 2019-11-24 DIAGNOSIS — R911 Solitary pulmonary nodule: Secondary | ICD-10-CM

## 2019-11-24 MED ORDER — DOXYCYCLINE HYCLATE 100 MG PO TABS: 100.0000 mg | ORAL_TABLET | Freq: Two times a day (BID) | ORAL | 0 refills | Status: DC

## 2019-11-24 NOTE — Telephone Encounter (Signed)
Dr. Annamaria Boots this is correct? No new patients.

## 2019-11-24 NOTE — Telephone Encounter (Signed)
I am ok seeing her when she can be worked into my schedule.

## 2019-11-24 NOTE — Telephone Encounter (Signed)
I called pt to see if she wanted to schedule Consult with CY. She didn't answer the phone, LM to call us back.

## 2019-11-24 NOTE — Telephone Encounter (Signed)
Pt called back, I scheduled her with CY

## 2019-11-24 NOTE — Patient Instructions (Addendum)
CT chest showed 59mm and 54mm lung nodules along the major fissure near the region of the consolidated pulmonary mass noted on prior study   Rx: Doxycycline 1 tab twice daily x 7 days  Orders: [facility] (lab work)  Follow-up: Patient wants to change to Dr. Annamaria Boots (new patient)

## 2019-11-24 NOTE — Progress Notes (Signed)
@Patient  ID: [name], female    DOB: 03-31-1946, 74 y.o.   MRN: [id number]  Chief Complaint  Patient presents with   Follow-up    pt is sore on right side of ribs.pt states taking out lobe that was infected    Referring provider: Crist Infante, MD  Synopsis: Initially seen for consult for incidental RML tree-in-bud opacity seen on CT abdomen during work-up for epigastric pain. Repeat CXR 01/05/19 was concerning for right infrahilar mass. CT Chest was ordered and she was referred to Pulmonary.   HPI: 74 year old female, never smoked.  Past medical history significant for right middle lobe lung nodule.  Patient of Dr. Loanne Drilling. S/p RML resection, pathology demonstrated necrotizing granulomatous inflammation presumed lung abscess started on Augmentin for 4 weeks.  Rx Percocet 5-3 25mg  BID as needed for pain. Continue IS, goal 1500-2030ml. Plan to obtain chest CT in 6 weeks.  Previous [facility] pulmonary encounter: 10/05/19- Dr. Loanne Drilling Since our last visit, we obtained a follow-up CT chest after a course of antibiotics that noted increased bulkiness of RML lung nodule concerning for infection vs malignancy. We discussed tissue sampling and arranged for pre-op PFTs and PET scan. On 4/5, she underwent bronchoscopy,robotic assisted right thoracoscopy, right lobectomy, lymph node sampling. No evidence of malignancy however pathology showed necrotizing granulomatous inflammation. She reports post-op chest pain but still able to do incentive spirometry up to 1200cc daily. She has limited her percocet to once a day at night and only taking tylenol as needed however does not feel like this controls her pain.  She has limited her activity and inquires what is acceptable in terms of lifting objects and ambulation. She is no longer wearing compression stockings and denies lower extremity edema. She denies fevers, chills, cough, shortness of breath. Denies dysphagia, aspiration or coughing spells with  eating.  10/09/2019 Patient presents today for acute visit.  Reports moderate right sided pleuritic pain since robotic assisted right thoracoscopy on 09/28/19. She was started on Augmentin for presumed lung abscess on 4/12 and given prescription for percocet. States that she only took two doses and reports developing nausea and loose stools (no diarrhea). She has not taking her antibiotic since Tuesday. She has been taking Percocet 5-325mg  two tablets twice daily and Tylenol every 8 hours for pain since Monday. Her husband states that she drinks a lot of milk, he also suggested she may benefit from holding her egular vitamins/supplements for the next week or two.   11/24/2019 Patient presents today for 6 week follow-up visit s/p robotic assisted thoracoscopy/right middle lobectomy in April. Transitioned to tramadol for pain control. She is not currently needing to taking anything for pain. She finished course of oral Augmentin for suspected lung abscess. Culture have showed no growth.  Lymph node negative for granulomas or malignancy. Repeat CT chest scan showed a stable 69mm lung nodule RLL, 6.92mm RLL nodule mildly increased in size and 101mm and 67mm noncalcified lung nodules along the major fissure near the region consolidated pulmonary mass noted on the prior study. She continues to have a productive cough, no significant purulent mucus. No chest tightness, shortness or breath and wheezing  SIGNIFICANT TESTING Imaging: >11/16/19 CT chest- stable 5 mm noncalcified lung nodule right lower lobe. 6.5 mm noncalcified right lower lobe lung nodule which is very mildly increased in size. 7 mm and 11.1 mm noncalcified lung nodules along the major fissure near the region of the consolidated pulmonary mass noted on the prior study. Residual tumor versus  metastatic disease cannot be excluded. Correlation with a nuclear medicine PET/CT is recommended. Mild to moderate severity linear scarring and postoperative changes  along the expected region of the posterior medial aspect of the right middle lobe.   > 09/28/19 Aerobic/Anaerobic culture Tissue RML showed moderate WBC present, predominately mononuclear. NO organism seen.   >09/28/19 Sugical pathology RML  Necrotizing granulomatous inflammation.  Incidental carcinoid tumorlet, 0.3 cm. No evidence of malignancy   LYMPH NODE, LEVEL 7, EXCISION:  - Benign reactive lymph node  - Negative for granulomas or malignancy    Allergies  Allergen Reactions   Pravachol [Pravastatin] Other (See Comments)    Thought it affected her memory and thinking   Prednisone Other (See Comments)    Pt. Stated "it made me crazy"    Immunization History  Administered Date(s) Administered   Influenza Split 04/16/2016    Past Medical History:  Diagnosis Date   Acid reflux    just occasionally   Arthritis    at the base of my spine   Gastritis    Hyperlipidemia    Hypertension    Pancreatitis     Tobacco History: Social History   Tobacco Use  Smoking Status Never Smoker  Smokeless Tobacco Never Used   Counseling given: Not Answered   Outpatient Medications Prior to Visit  Medication Sig Dispense Refill   Apoaequorin (PREVAGEN PO) Take 1 tablet by mouth daily.      co-enzyme Q-10 50 MG capsule Take 50 mg by mouth daily.     esomeprazole (NEXIUM) 40 MG capsule Take 40 mg by mouth daily.      KRILL OIL PO Take 1 capsule by mouth daily.     milk thistle 175 MG tablet Take 175 mg by mouth daily.      rosuvastatin (CRESTOR) 20 MG tablet Take 1 tablet (20 mg total) by mouth at bedtime. 90 tablet 3   Saccharomyces boulardii (PROBIOTIC) 250 MG CAPS Take 1 capsule by mouth in the morning and at bedtime. 60 capsule 0   valsartan (DIOVAN) 80 MG tablet Take 80 mg by mouth daily.     vitamin E 180 MG (400 UNITS) capsule Take 400 Units by mouth daily.     sucralfate (CARAFATE) 1 g tablet Take 1 tablet by mouth daily as needed (acid reflux).       triamcinolone cream (KENALOG) 0.1 % Apply 1 application topically 2 (two) times daily as needed (itching).     fluconazole (DIFLUCAN) 100 MG tablet Take 100 mg by mouth every other day.     NYSTATIN powder APPLY POWDER TOPICALLY TO AFFECTED AREA TWICE DAILY AS NEEDED     amoxicillin-clavulanate (AUGMENTIN) 875-125 MG tablet Take 1 tablet by mouth 2 (two) times daily. (Patient not taking: Reported on 10/09/2019) 60 tablet 0   No facility-administered medications prior to visit.    Review of Systems  Review of Systems  Constitutional: Negative for chills, diaphoresis and fever.  Respiratory: Positive for cough. Negative for shortness of breath and wheezing.     Physical Exam  BP 118/76 (BP Location: Left Arm, Cuff Size: Normal)    Pulse 82    Temp 98.7 F (37.1 C) (Oral)    Ht 5\' 3"  (1.6 m)    Wt 119 lb 12.8 oz (54.3 kg)    SpO2 95%    BMI 21.22 kg/m  Physical Exam Constitutional:      Appearance: Normal appearance.  Cardiovascular:     Rate and Rhythm: Normal rate and  regular rhythm.  Pulmonary:     Effort: Pulmonary effort is normal.     Breath sounds: Normal breath sounds.  Skin:    General: Skin is dry.  Neurological:     Mental Status: She is alert.  Psychiatric:        Mood and Affect: Mood normal.        Thought Content: Thought content normal.        Judgment: Judgment normal.      Lab Results:  CBC    Component Value Date/Time   WBC 11.1 (H) 10/02/2019 1525   RBC 3.84 (L) 10/02/2019 1525   HGB 11.3 (L) 10/02/2019 1525   HCT 35.6 (L) 10/02/2019 1525   PLT 294 10/02/2019 1525   MCV 92.7 10/02/2019 1525   MCH 29.4 10/02/2019 1525   MCHC 31.7 10/02/2019 1525   RDW 12.9 10/02/2019 1525    BMET    Component Value Date/Time   NA 136 10/02/2019 1525   K 3.5 10/02/2019 1525   CL 101 10/02/2019 1525   CO2 24 10/02/2019 1525   GLUCOSE 156 (H) 10/02/2019 1525   BUN 9 10/02/2019 1525   CREATININE 0.78 10/02/2019 1525   CALCIUM 8.8 (L) 10/02/2019 1525    GFRNONAA >60 10/02/2019 1525   GFRAA >60 10/02/2019 1525    BNP No results found for: BNP  ProBNP No results found for: PROBNP  Imaging: CT Chest Wo Contrast  Result Date: 11/16/2019 CLINICAL DATA:  History of partial lobectomy with suspected pleural effusion. EXAM: CT CHEST WITHOUT CONTRAST TECHNIQUE: Multidetector CT imaging of the chest was performed following the standard protocol without IV contrast. COMPARISON:  September 04, 2019 FINDINGS: Cardiovascular: No significant vascular findings. Normal heart size. No pericardial effusion. Mediastinum/Nodes: No enlarged mediastinal or axillary lymph nodes. Thyroid gland, trachea, and esophagus demonstrate no significant findings. Lungs/Pleura: Mild biapical scarring and/or atelectasis is seen. A stable 5 mm noncalcified lung nodule is seen within the posteromedial aspect of the right lower lobe. 7.9 mm and 11.1 mm noncalcified lung nodules are seen along the major fissure, just above the level of the hilum (axial CT image 68, CT series number 3). This is seen along the region of the consolidated pulmonary mass noted on the prior study. A 6.5 mm noncalcified pleural based lung nodule is seen along the posteromedial aspect of the right lower lobe. This is very mildly increased in size when compared to the prior exam (approximately 5 mm on the prior study). Mild to moderate severity linear scarring is seen along the expected region of the posterior medial aspect of the right middle lobe. Curvilinear surgical sutures are also seen in this region. There is no evidence of acute infiltrate or pneumothorax. A small right pleural effusion is seen. Upper Abdomen: No acute abnormality. Musculoskeletal: Multilevel degenerative changes seen throughout the thoracic spine. IMPRESSION: 1. Stable 5 mm noncalcified lung nodule within the posteromedial aspect of the right lower lobe. 2. 6.5 mm noncalcified right lower lobe lung nodule which is very mildly increased in size  when compared to the prior study. 3. 7 mm and 11.1 mm noncalcified lung nodules along the major fissure near the region of the consolidated pulmonary mass noted on the prior study. Residual tumor versus metastatic disease cannot be excluded. Correlation with a nuclear medicine PET/CT is recommended. 4. Mild to moderate severity linear scarring and postoperative changes along the expected region of the posterior medial aspect of the right middle lobe. Electronically Signed   By: Hoover Browns  Houston M.D.   On: 11/16/2019 15:53     Assessment & Plan:   Abscess of middle lobe of right lung without pneumonia (Edie) - S/p RML resection 09/28/19, pathology demonstrated necrotizing granulomatous inflammation presumed lung abscess. Completed course of oral Augmentin x 4 weeks.  - 09/28/19  Aerobic/Anaerobic culture Tissue RML showed moderate WBC present, predominately mononuclear. NO organism seen.  -  09/28/19 Surgical pathology RML showed necrotizing granulomatous inflammation negative for malignancy. Lymph node negative for granulomas or malignancy. - Repeat CT chest 11/16/19 showed stable 86mm lung nodule RLL, 6.54mm RLL nodule mildly increased in size and 46mm and 66mm noncalcified lung nodules along the major fissure near the region consolidated pulmonary mass noted on the prior study. - Plan Rx doxycycline 1 tab twice daily x 1 week d/t persistent productive cough  - Patient would like to have a consult with Dr. Annamaria Boots while Dr. Loanne Drilling is on maternity leave     Martyn Ehrich, NP 11/30/2019

## 2019-11-25 LAB — ANGIOTENSIN CONVERTING ENZYME: Angiotensin-Converting Enzyme: 9 U/L (ref 9–67)

## 2019-11-25 NOTE — Progress Notes (Signed)
Please let patient now lab/ACE level was normal. Follow-up with Dr. Annamaria Boots as scheduled

## 2019-11-26 ENCOUNTER — Telehealth: Payer: Self-pay | Admitting: Internal Medicine

## 2019-11-26 DIAGNOSIS — R918 Other nonspecific abnormal finding of lung field: Secondary | ICD-10-CM

## 2019-11-26 DIAGNOSIS — R911 Solitary pulmonary nodule: Secondary | ICD-10-CM

## 2019-11-26 NOTE — Telephone Encounter (Signed)
Dr Annamaria Boots- pt had PET on 09/15/19 and we just ordered another  Vallarie Mare is asking if this one needs to be done right now and if so it needs to be restaging PET  Please advise, thanks

## 2019-11-26 NOTE — Telephone Encounter (Signed)
Order placed, pt made aware. Nothing further is needed.

## 2019-11-26 NOTE — Telephone Encounter (Signed)
Suggest we schedule this PET a month from now, to give more time for her surgery to heal. Yes, this would be re-staging PET, recommended by radiologist after her last CT chest.

## 2019-11-26 NOTE — Telephone Encounter (Signed)
I will order restage PET. Thanks guys.

## 2019-11-26 NOTE — Telephone Encounter (Signed)
Ok to schedule PET scan neck to thigh for dx multiple lung nodules

## 2019-11-26 NOTE — Telephone Encounter (Signed)
CY this is the patient that switched from Dr. Loanne Drilling to you. She had a CT scan and wants to know if we can go ahead and schedule her PET scan. Can we order? Please advise.

## 2019-11-26 NOTE — Addendum Note (Signed)
Addended by: Jannette Spanner on: 11/26/2019 04:07 PM   Modules accepted: Orders

## 2019-11-27 ENCOUNTER — Telehealth: Payer: Self-pay | Admitting: Internal Medicine

## 2019-11-27 NOTE — Telephone Encounter (Signed)
error 

## 2019-11-27 NOTE — Progress Notes (Signed)
Left detailed vm that test results were normal and to follow up with Dr. Annamaria Boots as scheduled.

## 2019-11-30 NOTE — Assessment & Plan Note (Signed)
-   S/p RML resection 09/28/19, pathology demonstrated necrotizing granulomatous inflammation presumed lung abscess. Completed course of oral Augmentin x 4 weeks.  - 09/28/19  Aerobic/Anaerobic culture Tissue RML showed moderate WBC present, predominately mononuclear. NO organism seen.  -  09/28/19 Surgical pathology RML showed necrotizing granulomatous inflammation negative for malignancy. Lymph node negative for granulomas or malignancy. - Repeat CT chest 11/16/19 showed stable 55mm lung nodule RLL, 6.70mm RLL nodule mildly increased in size and 93mm and 54mm noncalcified lung nodules along the major fissure near the region consolidated pulmonary mass noted on the prior study. - Plan Rx doxycycline 1 tab twice daily x 1 week d/t persistent productive cough  - Patient would like to have a consult with Dr. Annamaria Boots while Dr. Loanne Drilling is on maternity leave

## 2019-11-30 NOTE — Progress Notes (Signed)
Ok

## 2019-12-14 ENCOUNTER — Other Ambulatory Visit: Payer: Self-pay

## 2019-12-14 ENCOUNTER — Ambulatory Visit (HOSPITAL_COMMUNITY)
Admission: RE | Admit: 2019-12-14 | Discharge: 2019-12-14 | Disposition: A | Discharge status: Home / Self Care | Payer: Medicare Other | Source: Ambulatory Visit | Attending: Internal Medicine | Admitting: Internal Medicine

## 2019-12-14 DIAGNOSIS — K5730 Diverticulosis of large intestine without perforation or abscess without bleeding: Secondary | ICD-10-CM | POA: Insufficient documentation

## 2019-12-14 DIAGNOSIS — Z902 Acquired absence of lung [part of]: Secondary | ICD-10-CM | POA: Insufficient documentation

## 2019-12-14 DIAGNOSIS — I700 Atherosclerosis of aorta: Secondary | ICD-10-CM | POA: Diagnosis not present

## 2019-12-14 DIAGNOSIS — J329 Chronic sinusitis, unspecified: Secondary | ICD-10-CM | POA: Insufficient documentation

## 2019-12-14 DIAGNOSIS — R918 Other nonspecific abnormal finding of lung field: Secondary | ICD-10-CM | POA: Diagnosis not present

## 2019-12-14 DIAGNOSIS — R911 Solitary pulmonary nodule: Secondary | ICD-10-CM | POA: Diagnosis not present

## 2019-12-14 LAB — GLUCOSE, CAPILLARY: Glucose-Capillary: 91 mg/dL (ref 70–99)

## 2019-12-14 MED ORDER — FLUDEOXYGLUCOSE F - 18 (FDG) INJECTION
5.9000 | Freq: Once | INTRAVENOUS | Status: AC | PRN
  Administered 2019-12-14: 5.9 via INTRAVENOUS

## 2019-12-17 ENCOUNTER — Other Ambulatory Visit: Payer: Self-pay

## 2019-12-17 ENCOUNTER — Other Ambulatory Visit (INDEPENDENT_AMBULATORY_CARE_PROVIDER_SITE_OTHER): Payer: Medicare Other

## 2019-12-17 ENCOUNTER — Encounter: Payer: Self-pay | Admitting: Internal Medicine

## 2019-12-17 ENCOUNTER — Ambulatory Visit: Payer: Medicare Other | Admitting: Internal Medicine

## 2019-12-17 ENCOUNTER — Telehealth: Payer: Self-pay | Admitting: Internal Medicine

## 2019-12-17 VITALS — BP 118/72 | HR 67 | Temp 97.3°F | Ht 63.0 in | Wt 120.0 lb

## 2019-12-17 DIAGNOSIS — K2970 Gastritis, unspecified, without bleeding: Secondary | ICD-10-CM | POA: Diagnosis not present

## 2019-12-17 DIAGNOSIS — J852 Abscess of lung without pneumonia: Secondary | ICD-10-CM

## 2019-12-17 LAB — CBC WITH DIFFERENTIAL/PLATELET
Basophils Absolute: 0.1 10*3/uL (ref 0.0–0.1)
Basophils Relative: 1.1 % (ref 0.0–3.0)
Eosinophils Absolute: 0.5 10*3/uL (ref 0.0–0.7)
Eosinophils Relative: 7.5 % — ABNORMAL HIGH (ref 0.0–5.0)
HCT: 35.3 % — ABNORMAL LOW (ref 36.0–46.0)
Hemoglobin: 11.9 g/dL — ABNORMAL LOW (ref 12.0–15.0)
Lymphocytes Relative: 14 % (ref 12.0–46.0)
Lymphs Abs: 0.9 10*3/uL (ref 0.7–4.0)
MCHC: 33.8 g/dL (ref 30.0–36.0)
MCV: 87.2 fl (ref 78.0–100.0)
Monocytes Absolute: 0.5 10*3/uL (ref 0.1–1.0)
Monocytes Relative: 8.9 % (ref 3.0–12.0)
Neutro Abs: 4.2 10*3/uL (ref 1.4–7.7)
Neutrophils Relative %: 68.5 % (ref 43.0–77.0)
Platelets: 296 10*3/uL (ref 150.0–400.0)
RBC: 4.05 Mil/uL (ref 3.87–5.11)
RDW: 13.4 % (ref 11.5–15.5)
WBC: 6.2 10*3/uL (ref 4.0–10.5)

## 2019-12-17 MED ORDER — ACETAMINOPHEN-CODEINE #3 300-30 MG PO TABS: ORAL_TABLET | ORAL | 0 refills | Status: DC

## 2019-12-17 MED ORDER — AMOXICILLIN 500 MG PO TABS: 500.0000 mg | ORAL_TABLET | Freq: Two times a day (BID) | ORAL | 0 refills | Status: DC

## 2019-12-17 NOTE — Assessment & Plan Note (Signed)
Discussed gastritis, GERD, reflux precautions.

## 2019-12-17 NOTE — Progress Notes (Signed)
CT Abdomen 11/13/18-mild RML tree in bud opacity CT Chest 01/09/19- scattered focal areas of tree-in-bud opacities in lower lobes bilaterally. RML bronchocele. Posterior opacity in RML, 58mm RLL nodule: recommended follow-up  ------------------------------------------------------------------------------- 12/17/19- 73 yoF never smoker followed for granulomatous inflammation addressed with RML lobectomy, neg for CA, neg cultures. Originally seen by Dr Loanne Drilling 02/10/2019 after w/u for epigastric pain, CT abd noted RML tree-in -bud. Subsequent concern of R infrahilar mass. Empiric Augmentin caused GI distress.  Bronchoscopy, R thoracoscopy, RML lobectomy, lymph node sampling 09/29/19- Dr Kipp Brood Path- Granulomatous inflammation, mononuclear cells, Aerobic/Anaerobic CX> rare Propionibacterium sp. AFB cultures not done. "Differential diagnosis for granulomatous inflammation primarily includes  infection and sarcoidosis. AFB and GMS special stains are negative for  acid-fast bacilli and fungal organisms respectively. Immunohistochemical  stain for synaptophysin highlights the carcinoid tumorlet."   Now has some residual R incisional pain. She avoids NSAIDs due to GERD, gastritis w hx remote gastric ulcer.Denies cough, phlegm, fever, sweats. Denies sinus disease. Empiric doxycyline no effect.  Labs - ACE 9, ANA Neg, ANCA Neg No hx TB exposure.  No Covax yet till she could discuss here.   CT chest 11/16/19-  IMPRESSION: 1. Stable 5 mm noncalcified lung nodule within the posteromedial aspect of the right lower lobe. 2. 6.5 mm noncalcified right lower lobe lung nodule which is very mildly increased in size when compared to the prior study. 3. 7 mm and 11.1 mm noncalcified lung nodules along the major fissure near the region of the consolidated pulmonary mass noted on the prior study. Residual tumor versus metastatic disease cannot be excluded. Correlation with a nuclear medicine PET/CT is  recommended. 4. Mild to moderate severity linear scarring and postoperative changes along the expected region of the posterior medial aspect of the right middle lobe.  PET scan 12/14/19-  IMPRESSION: 1. Interval right middle lobectomy. Along the regional scarring and resection site, there is some mild focal accentuated activity with maximum SUV 3.6 which could be postoperative, but which merits surveillance to exclude early recurrence. 2. Mild tree-in-bud nodularity in the left lower lobe is likely benign but likewise merits surveillance. 3. Other imaging findings of potential clinical significance: Aortic Atherosclerosis (ICD10-I70.0). Sigmoid colon diverticulosis. Mild chronic paranasal sinusitis.  Prior to Admission medications   Medication Sig Start Date End Date Taking? Authorizing Provider  acetaminophen-codeine (TYLENOL #3) 300-30 MG tablet 1-2 tabs every 8 hours if needed for pain 12/17/19   Baird Lyons D, MD  amoxicillin (AMOXIL) 500 MG tablet Take 1 tablet (500 mg total) by mouth 2 (two) times daily. 12/17/19   Baird Lyons D, MD  Apoaequorin (PREVAGEN PO) Take 1 tablet by mouth daily.     [provider]  co-enzyme Q-10 50 MG capsule Take 50 mg by mouth daily.    [provider]  esomeprazole (NEXIUM) 40 MG capsule Take 40 mg by mouth daily.  01/14/19   [provider]  fluconazole (DIFLUCAN) 100 MG tablet Take 100 mg by mouth every other day. Patient not taking: Reported on 12/17/2019 11/13/19   [provider]  KRILL OIL PO Take 1 capsule by mouth daily.    [provider]  milk thistle 175 MG tablet Take 175 mg by mouth daily.     [provider]  NYSTATIN powder APPLY POWDER TOPICALLY TO AFFECTED AREA TWICE DAILY AS NEEDED 11/13/19   [provider]  rosuvastatin (CRESTOR) 20 MG tablet Take 1 tablet (20 mg total) by mouth at bedtime. 06/11/19 06/10/20  Patwardhan,  Manish J, MD  Saccharomyces boulardii  (PROBIOTIC) 250 MG CAPS Take 1 capsule by mouth in the morning and at bedtime. 10/09/19   Martyn Ehrich, NP  vitamin E 180 MG (400 UNITS) capsule Take 400 Units by mouth daily.    [provider]   Past Medical History:  Diagnosis Date   Acid reflux    just occasionally   Arthritis    at the base of my spine   Gastritis    Hyperlipidemia    Hypertension    Pancreatitis    Past Surgical History:  Procedure Laterality Date   BREAST BIOPSY Left 2000   CATARACT EXTRACTION, BILATERAL     fractured tibia repair     INTERCOSTAL NERVE BLOCK Right 09/28/2019   Procedure: Intercostal Nerve Block;  Surgeon: Lajuana Matte, MD;  Location: [facility];  Service: Thoracic;  Laterality: Right;   NODE DISSECTION Right 09/28/2019   Procedure: Node Dissection;  Surgeon: Lajuana Matte, MD;  Location: [facility];  Service: Thoracic;  Laterality: Right;   RHINOPLASTY     VIDEO BRONCHOSCOPY N/A 09/28/2019   Procedure: VIDEO BRONCHOSCOPY;  Surgeon: Lajuana Matte, MD;  Location: [facility];  Service: Thoracic;  Laterality: N/A;   Family History  Problem Relation Age of Onset   Breast cancer Sister 13   Migraines Sister    Hernia Mother    Migraines Mother    Ulcers Mother    Heart failure Father    Ulcers Father    Obesity Sister    Social History   Socioeconomic History   Marital status: Married    Spouse name: Not on file   Number of children: 3   Years of education: Not on file   Highest education level: Not on file  Occupational History   Not on file  Tobacco Use   Smoking status: Never Smoker   Smokeless tobacco: Never Used  Vaping Use   Vaping Use: Never used  Substance and Sexual Activity   Alcohol use: Yes    Comment: 2 per week   Drug use: No   Sexual activity: Not on file  Other Topics Concern   Not on file  Social History Narrative   Not on file   Social Determinants of Health   Financial Resource Strain:    Difficulty  of Paying Living Expenses:   Food Insecurity:    Worried About Charity fundraiser in the Last Year:    Arboriculturist in the Last Year:   Transportation Needs:    Film/video editor (Medical):    Lack of Transportation (Non-Medical):   Physical Activity:    Days of Exercise per Week:    Minutes of Exercise per Session:   Stress:    Feeling of Stress :   Social Connections:    Frequency of Communication with Friends and Family:    Frequency of Social Gatherings with Friends and Family:    Attends Religious Services:    Active Member of Clubs or Organizations:    Attends Music therapist:    Marital Status:   Intimate Partner Violence:    Fear of Current or Ex-Partner:    Emotionally Abused:    Physically Abused:    Sexually Abused:    ROS-see HPI   + = positive Constitutional:    weight loss, night sweats, fevers, chills, fatigue, lassitude. HEENT:    headaches, difficulty swallowing, tooth/dental problems, sore throat,  sneezing, itching, ear ache, nasal congestion, post nasal drip, snoring CV:   + chest pain, orthopnea, PND, swelling in lower extremities, anasarca,                                  dizziness, palpitations Resp:   shortness of breath with exertion or at rest.                productive cough,   non-productive cough, coughing up of blood.              change in color of mucus.  wheezing.   Skin:    rash or lesions. GI:  No-   heartburn, indigestion, abdominal pain, nausea, vomiting, diarrhea,                 change in bowel habits, loss of appetite GU: dysuria, change in color of urine, no urgency or frequency.   flank pain. MS:   joint pain, stiffness, decreased range of motion, back pain. Neuro-     nothing unusual Psych:  change in mood or affect.  depression or anxiety.   memory loss.  OBJ- Physical Exam General- Alert, Oriented, Affect-appropriate, Distress- none acute Skin- rash-none, lesions- none, excoriation-  none Lymphadenopathy- none Head- atraumatic            Eyes- Gross vision intact, PERRLA, conjunctivae and secretions clear            Ears- Hearing, canals-normal            Nose- Clear, no-Septal dev, mucus, polyps, erosion, perforation             Throat- Mallampati II , mucosa clear , drainage- none, tonsils- atrophic Neck- flexible , trachea midline, no stridor , thyroid nl, carotid no bruit Chest - symmetrical excursion , unlabored           Heart/CV- RRR , no murmur , no gallop  , no rub, nl s1 s2                           - JVD- none , edema- none, stasis changes- none, varices- none           Lung- clear to P&A, wheeze- none, cough- none , dullness-none, rub- none           Chest wall- +R surgical site healing Abd-  Br/ Gen/ Rectal- Not done, not indicated Extrem- cyanosis- none, clubbing, none, atrophy- none, strength- nl Neuro- grossly intact to observation

## 2019-12-17 NOTE — Assessment & Plan Note (Addendum)
ACE is normal, excluding active sarcoid. ANCA and ANA are negative.Given areas of tree-in -bud inflammation in R and left lung, I am suspicious she has either been aspirating or has MAIC, or both. There has been no identification of neoplasia. Plan- observation. Empiric Rx now with amoxacillin, since she saw no benefit from doxycycline and had  distress from augmentin previously. If she develops productive cough will try for sputum cultures esp for AFB. Im deferring macrolide/ Zithromax for now in case MAIC identified. Labs for Quantiferon TB Gold and CBC w diff. Tylenol w codeine short term to help with residual thoracotomy pain if needed.

## 2019-12-17 NOTE — Patient Instructions (Signed)
Order- Lab- Quantiferon gold TB assay    Dx  Lung abscess                      CBC w diff  Script sent for amoxacillin and for Tylenol III  Please call as needed

## 2019-12-17 NOTE — Telephone Encounter (Signed)
Deneise Lever, MD  P [facility] Triage [facility] blood cell count is back down to normal. There is still very mild anemia, but better. Eosinophil count is elevated. This might possibly have something to do with the granulomatous inflammation- we can recheck it at next visit.  ------------------------------------------------------ Spoke with pt. She is aware of results. Nothing further was needed.

## 2019-12-23 DIAGNOSIS — K5900 Constipation, unspecified: Secondary | ICD-10-CM | POA: Diagnosis not present

## 2019-12-23 DIAGNOSIS — R109 Unspecified abdominal pain: Secondary | ICD-10-CM | POA: Diagnosis not present

## 2019-12-24 DIAGNOSIS — Z0000 Encounter for general adult medical examination without abnormal findings: Secondary | ICD-10-CM | POA: Diagnosis not present

## 2019-12-24 DIAGNOSIS — E7849 Other hyperlipidemia: Secondary | ICD-10-CM | POA: Diagnosis not present

## 2019-12-24 DIAGNOSIS — I10 Essential (primary) hypertension: Secondary | ICD-10-CM | POA: Diagnosis not present

## 2019-12-24 DIAGNOSIS — M810 Age-related osteoporosis without current pathological fracture: Secondary | ICD-10-CM | POA: Diagnosis not present

## 2019-12-31 DIAGNOSIS — E7849 Other hyperlipidemia: Secondary | ICD-10-CM | POA: Diagnosis not present

## 2019-12-31 DIAGNOSIS — R82998 Other abnormal findings in urine: Secondary | ICD-10-CM | POA: Diagnosis not present

## 2019-12-31 DIAGNOSIS — Z1212 Encounter for screening for malignant neoplasm of rectum: Secondary | ICD-10-CM | POA: Diagnosis not present

## 2019-12-31 DIAGNOSIS — K219 Gastro-esophageal reflux disease without esophagitis: Secondary | ICD-10-CM | POA: Diagnosis not present

## 2019-12-31 DIAGNOSIS — R29818 Other symptoms and signs involving the nervous system: Secondary | ICD-10-CM | POA: Insufficient documentation

## 2019-12-31 DIAGNOSIS — R918 Other nonspecific abnormal finding of lung field: Secondary | ICD-10-CM | POA: Diagnosis not present

## 2019-12-31 DIAGNOSIS — Z0000 Encounter for general adult medical examination without abnormal findings: Secondary | ICD-10-CM | POA: Diagnosis not present

## 2019-12-31 DIAGNOSIS — I10 Essential (primary) hypertension: Secondary | ICD-10-CM | POA: Diagnosis not present

## 2020-01-04 ENCOUNTER — Telehealth: Payer: Self-pay | Admitting: Internal Medicine

## 2020-01-04 MED ORDER — NYSTATIN 100000 UNIT/GM EX POWD: CUTANEOUS | 99 refills | Status: DC

## 2020-01-04 NOTE — Telephone Encounter (Signed)
Called and spoke with pt letting her know that we were going to refill her nystatin powder and state on there that it is okay to refill prn. Pt verbalized understanding. Nothing further needed.

## 2020-01-04 NOTE — Telephone Encounter (Signed)
Called and spoke with pt who is requesting a refill of nystatin. Dr. Annamaria Boots, please advise.  Allergies  Allergen Reactions  . Pravachol [Pravastatin] Other (See Comments)    Thought it affected her memory and thinking  . Prednisone Other (See Comments)    Pt. Stated "it made me crazy"     Current Outpatient Medications:  .  acetaminophen-codeine (TYLENOL #3) 300-30 MG tablet, 1-2 tabs every 8 hours if needed for pain, Disp: 30 tablet, Rfl: 0 .  amoxicillin (AMOXIL) 500 MG tablet, Take 1 tablet (500 mg total) by mouth 2 (two) times daily., Disp: 14 tablet, Rfl: 0 .  Apoaequorin (PREVAGEN PO), Take 1 tablet by mouth daily. , Disp: , Rfl:  .  co-enzyme Q-10 50 MG capsule, Take 50 mg by mouth daily., Disp: , Rfl:  .  esomeprazole (NEXIUM) 40 MG capsule, Take 40 mg by mouth daily. , Disp: , Rfl:  .  fluconazole (DIFLUCAN) 100 MG tablet, Take 100 mg by mouth every other day. (Patient not taking: Reported on 12/17/2019), Disp: , Rfl:  .  KRILL OIL PO, Take 1 capsule by mouth daily., Disp: , Rfl:  .  milk thistle 175 MG tablet, Take 175 mg by mouth daily. , Disp: , Rfl:  .  NYSTATIN powder, APPLY POWDER TOPICALLY TO AFFECTED AREA TWICE DAILY AS NEEDED, Disp: , Rfl:  .  rosuvastatin (CRESTOR) 20 MG tablet, Take 1 tablet (20 mg total) by mouth at bedtime., Disp: 90 tablet, Rfl: 3 .  Saccharomyces boulardii (PROBIOTIC) 250 MG CAPS, Take 1 capsule by mouth in the morning and at bedtime., Disp: 60 capsule, Rfl: 0 .  vitamin E 180 MG (400 UNITS) capsule, Take 400 Units by mouth daily., Disp: , Rfl:

## 2020-01-04 NOTE — Telephone Encounter (Signed)
Ok please refill nystatin (ok to refill "prn")

## 2020-01-14 DIAGNOSIS — C44311 Basal cell carcinoma of skin of nose: Secondary | ICD-10-CM | POA: Diagnosis not present

## 2020-01-14 DIAGNOSIS — L570 Actinic keratosis: Secondary | ICD-10-CM | POA: Diagnosis not present

## 2020-01-14 DIAGNOSIS — Z85828 Personal history of other malignant neoplasm of skin: Secondary | ICD-10-CM | POA: Diagnosis not present

## 2020-01-14 DIAGNOSIS — L821 Other seborrheic keratosis: Secondary | ICD-10-CM | POA: Diagnosis not present

## 2020-01-14 DIAGNOSIS — D2262 Melanocytic nevi of left upper limb, including shoulder: Secondary | ICD-10-CM | POA: Diagnosis not present

## 2020-02-04 DIAGNOSIS — Z85828 Personal history of other malignant neoplasm of skin: Secondary | ICD-10-CM | POA: Diagnosis not present

## 2020-02-04 DIAGNOSIS — C44311 Basal cell carcinoma of skin of nose: Secondary | ICD-10-CM | POA: Diagnosis not present

## 2020-03-30 ENCOUNTER — Ambulatory Visit (INDEPENDENT_AMBULATORY_CARE_PROVIDER_SITE_OTHER): Payer: Medicare Other

## 2020-03-30 ENCOUNTER — Ambulatory Visit: Payer: Medicare Other | Admitting: Internal Medicine

## 2020-03-30 ENCOUNTER — Other Ambulatory Visit: Payer: Self-pay

## 2020-03-30 ENCOUNTER — Encounter: Payer: Self-pay | Admitting: Internal Medicine

## 2020-03-30 VITALS — BP 112/70 | HR 64 | Temp 97.6°F | Ht 63.0 in | Wt 118.2 lb

## 2020-03-30 DIAGNOSIS — M3130 Wegener's granulomatosis without renal involvement: Secondary | ICD-10-CM

## 2020-03-30 DIAGNOSIS — J852 Abscess of lung without pneumonia: Secondary | ICD-10-CM | POA: Diagnosis not present

## 2020-03-30 DIAGNOSIS — F4320 Adjustment disorder, unspecified: Secondary | ICD-10-CM | POA: Insufficient documentation

## 2020-03-30 DIAGNOSIS — Z23 Encounter for immunization: Secondary | ICD-10-CM | POA: Diagnosis not present

## 2020-03-30 DIAGNOSIS — J90 Pleural effusion, not elsewhere classified: Secondary | ICD-10-CM | POA: Diagnosis not present

## 2020-03-30 DIAGNOSIS — F4321 Adjustment disorder with depressed mood: Secondary | ICD-10-CM | POA: Diagnosis not present

## 2020-03-30 NOTE — Assessment & Plan Note (Signed)
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  Plan- CXR, re-order Quantiferon TB gold not drawn last visit. Flu vax

## 2020-03-30 NOTE — Progress Notes (Signed)
HPI Tina Erickson never smoker followed for granulomatous inflammation/ abscess addressed with RML lobectomy, neg for CA, neg cultures. Originally seen by Dr Loanne Drilling 02/10/2019 after w/u for epigastric pain, CT abd noted RML tree-in -bud. Subsequent concern of R infrahilar mass. Empiric Augmentin caused GI distress.  Bronchoscopy, R thoracoscopy, RML lobectomy, lymph node sampling 4/6/Tina- Dr Kipp Brood Path- Granulomatous inflammation, mononuclear cells, Aerobic/Anaerobic CX> rare Propionibacterium sp. AFB cultures not done. "Differential diagnosis for granulomatous inflammation primarily includes  infection and sarcoidosis. AFB and GMS special stains are negative for  acid-fast bacilli and fungal organisms respectively. Immunohistochemical  stain for synaptophysin highlights the carcinoid tumorlet."   CT Abdomen 5/Tina/20-mild RML tree in bud opacity CT Chest 01/09/19- scattered focal areas of tree-in-bud opacities in lower lobes bilaterally. RML bronchocele. Posterior opacity in RML, 84mm RLL nodule: recommended follow-up Labs - ACE 9, ANA Neg, ANCA Neg CT chest 5/4/Tina PET 6/Tina/Tina-mild focal accentuated activity with maximum SUV 3.6 which could be postoperative 2. Mild tree-in-bud nodularity in the left lower lobe  ------------------------------------------------------------------------------- 6/24/Tina- Tina Erickson never smoker followed for granulomatous inflammation addressed with RML lobectomy, neg for CA, neg cultures. Originally seen by Dr Loanne Drilling 02/10/2019 after w/u for epigastric pain, CT abd noted RML tree-in -bud. Subsequent concern of R infrahilar mass. Empiric Augmentin caused GI distress.  Bronchoscopy, R thoracoscopy, RML lobectomy, lymph node sampling 4/6/Tina- Dr Kipp Brood Path- Granulomatous inflammation, mononuclear cells, Aerobic/Anaerobic CX> rare Propionibacterium sp. AFB cultures not done. "Differential diagnosis for granulomatous inflammation primarily includes  infection and sarcoidosis. AFB  and GMS special stains are negative for  acid-fast bacilli and fungal organisms respectively. Immunohistochemical  stain for synaptophysin highlights the carcinoid tumorlet."   Now has some residual R incisional pain. She avoids NSAIDs due to GERD, gastritis w hx remote gastric ulcer.Denies cough, phlegm, fever, sweats. Denies sinus disease. Empiric doxycyline no effect.  Labs - ACE 9, ANA Neg, ANCA Neg No hx TB exposure.  No Covax yet till she could discuss here. CT chest 5/24/Tina-  IMPRESSION: 1. Stable 5 mm noncalcified lung nodule within the posteromedial aspect of the right lower lobe. 2. 6.5 mm noncalcified right lower lobe lung nodule which is very mildly increased in size when compared to the prior study. 3. 7 mm and 11.1 mm noncalcified lung nodules along the major fissure near the region of the consolidated pulmonary mass noted on the prior study. Residual tumor versus metastatic disease cannot be excluded. Correlation with a nuclear medicine PET/CT is recommended. 4. Mild to moderate severity linear scarring and postoperative changes along the expected region of the posterior medial aspect of the right middle lobe. PET scan 6/Tina/Tina-  IMPRESSION: 1. Interval right middle lobectomy. Along the regional scarring and resection site, there is some mild focal accentuated activity with maximum SUV 3.6 which could be postoperative, but which merits surveillance to exclude early recurrence. 2. Mild tree-in-bud nodularity in the left lower lobe is likely benign but likewise merits surveillance. 3. Other imaging findings of potential clinical significance: Aortic Atherosclerosis (ICD10-I70.0). Sigmoid colon diverticulosis. Mild chronic paranasal sinusitis.  10/6/Tina- Tina Erickson never smoker followed for granulomatous inflammation/ abscess, addressed with RML lobectomy, neg for CA, neg cultures.Complicated by HTN, Pancreatitis, Gastritis, Hyperlipidemia, Bronchoscopy, R thoracoscopy, RML  lobectomy, lymph node sampling 4/6/Tina- Dr Kipp Brood Path- Granulomatous inflammation, mononuclear cells, Aerobic/Anaerobic CX> rare Propionibacterium sp. AFB cultures not done. PET in June showed accentuated post-op acitivity @ resection site RML and tree in bud  LLL, for Tina Erickson/u. At last visit we gave empiric amoxacillin (GI distress  from augmentin. Macrolides deferred in case future MAIC identified). Cough has improved. Covid vax- 2 Moderna Flu vax- today senior Husband died unexpectedly at home- found on floor 1 month ago.She is depressed and has lost weight. Does have family and friend support.  She has some mild dry cough but denies fever, sweats, nodes. Still a little tender R lat thoracic VATS site as expected.   ROS-see HPI   + = positive Constitutional:   + weight loss, night sweats, fevers, chills, fatigue, lassitude. HEENT:    headaches, difficulty swallowing, tooth/dental problems, sore throat,       sneezing, itching, ear ache, nasal congestion, post nasal drip, snoring CV:   + post VATS chest pain, orthopnea, PND, swelling in lower extremities, anasarca,                                  dizziness, palpitations Resp:   shortness of breath with exertion or at rest.                productive cough,   +non-productive cough, coughing up of blood.              change in color of mucus.  wheezing.   Skin:    rash or lesions. GI:  No-   heartburn, indigestion, abdominal pain, nausea, vomiting, diarrhea,                 change in bowel habits, loss of appetite GU: dysuria, change in color of urine, no urgency or frequency.   flank pain. MS:   joint pain, stiffness, decreased range of motion, back pain. Neuro-     nothing unusual Psych:  change in mood or affect.  depression or anxiety.   memory loss.  OBJ- Physical Exam General- Alert, Oriented, Affect-appropriate, Distress- none acute, + slender Skin- rash-none, lesions- none, excoriation- none Lymphadenopathy- none Head-  atraumatic            Eyes- Gross vision intact, PERRLA, conjunctivae and secretions clear            Ears- Hearing, canals-normal            Nose- Clear, no-Septal dev, mucus, polyps, erosion, perforation             Throat- Mallampati II , mucosa clear , drainage- none, tonsils- atrophic Neck- flexible , trachea midline, no stridor , thyroid nl, carotid no bruit Chest - symmetrical excursion , unlabored           Heart/CV- RRR , no murmur , no gallop  , no rub, nl s1 s2                           - JVD- none , edema- none, stasis changes- none, varices- none           Lung- clear to P&A, wheeze- none, cough- none , dullness-none, rub- none           Chest wall-  Abd-  Br/ Gen/ Rectal- Not done, not indicated Extrem- cyanosis- none, clubbing, none, atrophy- none, strength- nl Neuro- grossly intact to observation

## 2020-03-30 NOTE — Patient Instructions (Signed)
Ok to take tylenol or tylenol PM if it helps sleep for now  Order- Flu vax- senior  Order- CXR  Dx granulomatous inflammation, RML resection  I'm sorry for your loss. Please let us know if we can help.

## 2020-03-30 NOTE — Assessment & Plan Note (Signed)
Seems to be dealing appropriately and has support.  Has lost weight.

## 2020-03-30 NOTE — Addendum Note (Signed)
Addended by: Suzzanne Cloud E on: 03/30/2020 10:52 AM   Modules accepted: Orders

## 2020-03-31 ENCOUNTER — Telehealth: Payer: Self-pay | Admitting: Internal Medicine

## 2020-03-31 NOTE — Telephone Encounter (Signed)
Called and spoke with patient to let her know about CXR results. CXR- stable, with old surgical scarring but no active process seen. Good report. Patient expressed understanding. Nothing further needed at this time.

## 2020-04-01 ENCOUNTER — Telehealth: Payer: Self-pay | Admitting: Internal Medicine

## 2020-04-01 LAB — QUANTIFERON-TB GOLD PLUS
Mitogen-NIL: 4.46 IU/mL
NIL: 0.02 IU/mL
QuantiFERON-TB Gold Plus: NEGATIVE
TB1-NIL: 0 IU/mL
TB2-NIL: 0 IU/mL

## 2020-04-01 NOTE — Telephone Encounter (Signed)
Spoke with patient. She stated that she has been made aware of her results. Nothing further needed at time of call.

## 2020-04-22 ENCOUNTER — Ambulatory Visit: Payer: Medicare Other | Admitting: Cardiology

## 2020-04-22 ENCOUNTER — Encounter: Payer: Self-pay | Admitting: Cardiology

## 2020-04-22 ENCOUNTER — Other Ambulatory Visit: Payer: Self-pay

## 2020-04-22 VITALS — BP 151/74 | HR 73 | Resp 16 | Ht 63.0 in | Wt 117.0 lb

## 2020-04-22 DIAGNOSIS — E782 Mixed hyperlipidemia: Secondary | ICD-10-CM | POA: Diagnosis not present

## 2020-04-22 DIAGNOSIS — I10 Essential (primary) hypertension: Secondary | ICD-10-CM | POA: Diagnosis not present

## 2020-04-22 NOTE — Progress Notes (Signed)
Patient referred by Crist Infante, MD for hyperlipidemia  Subjective:   Tina Erickson, female    DOB: 10-07-1945, 74 y.o.   MRN: [id number]    Chief Complaint  Patient presents with   Hypertension   Hyperlipidemia   Follow-up    HPI  74 y.o. Caucasian female with controlled hypertension, hyperlipidemia, h/o pancreatitis, family h/o CAD.  Sine her last visit with me, she has undergone right middle lobe lobectomy for granulomatous disease. She lost her husband in April and has been mourning and recovering from it. She denies chest pain, shortness of breath, palpitations, leg edema, orthopnea, PND, TIA/syncope. Blood pressure elevated today, improved on subsequent check.   Current Outpatient Medications on File Prior to Visit  Medication Sig Dispense Refill   amoxicillin (AMOXIL) 500 MG tablet Take 1 tablet (500 mg total) by mouth 2 (two) times daily. (Patient not taking: Reported on 03/30/2020) 14 tablet 0   Apoaequorin (PREVAGEN PO) Take 1 tablet by mouth daily.      co-enzyme Q-10 50 MG capsule Take 50 mg by mouth daily.     esomeprazole (NEXIUM) 40 MG capsule Take 40 mg by mouth daily.      fluconazole (DIFLUCAN) 100 MG tablet Take 100 mg by mouth every other day. (Patient not taking: Reported on 12/17/2019)     KRILL OIL PO Take 1 capsule by mouth daily.     milk thistle 175 MG tablet Take 175 mg by mouth daily.      NYSTATIN powder APPLY POWDER TOPICALLY TO AFFECTED AREA TWICE DAILY AS NEEDED 15 g prn   rosuvastatin (CRESTOR) 20 MG tablet Take 1 tablet (20 mg total) by mouth at bedtime. 90 tablet 3   Saccharomyces boulardii (PROBIOTIC) 250 MG CAPS Take 1 capsule by mouth in the morning and at bedtime. 60 capsule 0   sucralfate (CARAFATE) 1 g tablet Take 1 g by mouth 4 (four) times daily -  with meals and at bedtime. As needed.     triamcinolone cream (KENALOG) 0.1 % Apply 1 application topically 2 (two) times daily. As needed     vitamin E 180 MG (400 UNITS)  capsule Take 400 Units by mouth daily.     No current facility-administered medications on file prior to visit.    Cardiovascular studies:  EKG 04/22/2020: Sinus rhythm 71 bpm with sinus arrhtymia   ABI 12/23/2018: This exam reveals normal perfusion of the lower extremity (RABI 0.98 and LABI 0.98).  There is mildly abnormal biphasic waveform in the right DP artery.   Lexiscan Myoview Stress Test 12/22/2018: Stress EKG is non-diagnostic, as this is pharmacological stress test. Myocardial pefusion imaging is normal. Left ventricular ejection fraction is 73% with normal wall motion. Low risk study.  CT abdomen 11/13/2018: 1. No acute findings or explanation for the patient's symptoms. 2. Limited evaluation of the stomach due to incomplete distention and absence of enteric contrast. Possible gastric wall thickening as can be seen with gastritis. 3. Small renal cysts. 4.  Aortic Atherosclerosis (ICD10-I70.0).  Recent labs: 12/24/2019: Glucose N/A, BUN/Cr 14/0.9. EGFR 61. HbA1C N/A Chol 174, TG 61, HDL 77, LDL 85 TSH 1.8 normal   11/03/2018: Glucose 88. BUN/Cr 11/0.9. eGFR normal. Na/K 139/4.1. Rest of the CMP normal. H/H 11/36. MCV 94. Platelets 244. Chol 296, TG 114, HDL 64, LDL 209 Apolipoprotein B 126 (high) TSH normal  Review of Systems  Cardiovascular: Negative for chest pain, dyspnea on exertion, leg swelling, palpitations and syncope.  Vitals:   04/22/20 1121  BP: (!) 151/74  Pulse: 73  Resp: 16  SpO2: 98%    Objective:   Physical Exam Vitals and nursing note reviewed.  Constitutional:      General: She is not in acute distress. Neck:     Vascular: No JVD.  Cardiovascular:     Rate and Rhythm: Normal rate and regular rhythm.     Heart sounds: Normal heart sounds. No murmur heard.   Pulmonary:     Effort: Pulmonary effort is normal.     Breath sounds: Normal breath sounds. No wheezing or rales.           Assessment & Recommendations:    74 y.o. Caucasian female with controlled hypertension, hyperlipidemia, s/p RML lobectomy  Hyperlipidemia: LDL down from 209 to 85. Continue Crestor 20 mg daily. Tolerating well.  Hypertension: Fairly well controlled. Arranged for remote patient monitoring through pur pharmacist Manuela Schwartz.  F/u in 1 year  Nigel Mormon, MD [facility] Pager: [phone] Office: [phone] If no answer Cell [phone]

## 2020-04-24 DIAGNOSIS — I10 Essential (primary) hypertension: Secondary | ICD-10-CM | POA: Diagnosis not present

## 2020-05-24 DIAGNOSIS — I10 Essential (primary) hypertension: Secondary | ICD-10-CM | POA: Diagnosis not present

## 2020-06-30 DIAGNOSIS — B373 Candidiasis of vulva and vagina: Secondary | ICD-10-CM | POA: Diagnosis not present

## 2020-06-30 DIAGNOSIS — E785 Hyperlipidemia, unspecified: Secondary | ICD-10-CM | POA: Diagnosis not present

## 2020-06-30 DIAGNOSIS — M810 Age-related osteoporosis without current pathological fracture: Secondary | ICD-10-CM | POA: Diagnosis not present

## 2020-06-30 DIAGNOSIS — I10 Essential (primary) hypertension: Secondary | ICD-10-CM | POA: Diagnosis not present

## 2020-06-30 DIAGNOSIS — G609 Hereditary and idiopathic neuropathy, unspecified: Secondary | ICD-10-CM | POA: Diagnosis not present

## 2020-06-30 DIAGNOSIS — R945 Abnormal results of liver function studies: Secondary | ICD-10-CM | POA: Diagnosis not present

## 2020-08-08 ENCOUNTER — Other Ambulatory Visit: Payer: Self-pay | Admitting: Cardiology

## 2020-08-08 ENCOUNTER — Other Ambulatory Visit: Payer: Self-pay | Admitting: Internal Medicine

## 2020-08-08 DIAGNOSIS — E782 Mixed hyperlipidemia: Secondary | ICD-10-CM

## 2020-08-08 DIAGNOSIS — Z1231 Encounter for screening mammogram for malignant neoplasm of breast: Secondary | ICD-10-CM

## 2020-08-12 ENCOUNTER — Other Ambulatory Visit: Payer: Self-pay

## 2020-08-12 ENCOUNTER — Ambulatory Visit: Payer: Medicare HMO | Admitting: Cardiology

## 2020-08-12 ENCOUNTER — Inpatient Hospital Stay: Payer: Medicare HMO

## 2020-08-12 VITALS — BP 115/76 | HR 86 | Ht 63.0 in | Wt 117.0 lb

## 2020-08-12 DIAGNOSIS — I10 Essential (primary) hypertension: Secondary | ICD-10-CM

## 2020-08-12 DIAGNOSIS — R55 Syncope and collapse: Secondary | ICD-10-CM

## 2020-08-12 NOTE — Progress Notes (Signed)
Vitals:   08/12/20 1354  BP: 115/76  Pulse: 86  SpO2: 98%   Physical Exam Vitals and nursing note reviewed.  Constitutional:      General: She is not in acute distress. Neck:     Vascular: No JVD.  Pulmonary:     Effort: Pulmonary effort is normal.    EKG 08/12/2020: Sinus rhythm 80 bpm Occasional PAC    Borderline left arial enlargement  Episode of presyncope. Smart watch reportedly showed Afib Recommend 2 week cardiac telemetry, then f/u   Nigel Mormon, MD Pager: [phone] Office: [phone]

## 2020-08-13 DIAGNOSIS — R55 Syncope and collapse: Secondary | ICD-10-CM | POA: Diagnosis not present

## 2020-08-25 DIAGNOSIS — I10 Essential (primary) hypertension: Secondary | ICD-10-CM | POA: Diagnosis not present

## 2020-08-26 DIAGNOSIS — H52203 Unspecified astigmatism, bilateral: Secondary | ICD-10-CM | POA: Diagnosis not present

## 2020-08-26 DIAGNOSIS — Z961 Presence of intraocular lens: Secondary | ICD-10-CM | POA: Diagnosis not present

## 2020-09-06 DIAGNOSIS — R55 Syncope and collapse: Secondary | ICD-10-CM | POA: Diagnosis not present

## 2020-09-06 DIAGNOSIS — I10 Essential (primary) hypertension: Secondary | ICD-10-CM | POA: Diagnosis not present

## 2020-09-14 ENCOUNTER — Telehealth: Payer: Self-pay | Admitting: Pharmacist

## 2020-09-14 NOTE — Telephone Encounter (Signed)
CARE PLAN ENTRY  09/14/2020 Name: Tina Erickson MRN: [id number] DOB: February 20, 1946  Tina Erickson is enrolled in Remote Patient Monitoring/Principle Care Monitoring.  Date of Enrollment: 04/22/20 Supervising physician: Vernell Leep Indication: HTN  Remote Readings: Compliant and Avg BP: 123/78, HR:73  Next scheduled OV: 09/19/20  Pharmacist Clinical Goal(s):  Marland Kitchen Over the next 90 days, patient will demonstrate Improved medication adherence as evidenced by medication fill history . Over the next 90 days, patient will demonstrate improved understanding of prescribed medications and rationale for usage as evidenced by patient teach back . Over the next 90 days, patient will experience decrease in ED visits. ED visits in last 6 months = 0 . Over the next 90 days, patient will not experience hospital admission. Hospital Admissions in last 6 months = 0  Interventions: . Provider and Inter-disciplinary care team collaboration (see longitudinal plan of care) . Comprehensive medication review performed. . Discussed plans with patient for ongoing care management follow up and provided patient with direct contact information for care management team . Collaboration with provider re: medication management  Patient Self Care Activities:  . Self administers medications as prescribed . Attends all scheduled provider appointments . Performs ADL's independently . Performs IADL's independently  Allergies  Allergen Reactions  . Pravachol [Pravastatin] Other (See Comments)    Thought it affected her memory and thinking  . Prednisone Other (See Comments)    Pt. Stated "it made me crazy"   Outpatient Encounter Medications as of 09/14/2020  Medication Sig  . Apoaequorin (PREVAGEN PO) Take 1 tablet by mouth daily.   Marland Kitchen co-enzyme Q-10 50 MG capsule Take 50 mg by mouth daily.  Marland Kitchen esomeprazole (NEXIUM) 40 MG capsule Take 40 mg by mouth daily.   Marland Kitchen KRILL OIL PO Take 1 capsule by mouth daily.  . milk  thistle 175 MG tablet Take 175 mg by mouth daily.   . NYSTATIN powder APPLY POWDER TOPICALLY TO AFFECTED AREA TWICE DAILY AS NEEDED  . rosuvastatin (CRESTOR) 20 MG tablet TAKE 1 TABLET BY MOUTH AT BEDTIME  . Saccharomyces boulardii (PROBIOTIC) 250 MG CAPS Take 1 capsule by mouth in the morning and at bedtime.  . sucralfate (CARAFATE) 1 g tablet Take 1 g by mouth 4 (four) times daily -  with meals and at bedtime. As needed.  . triamcinolone cream (KENALOG) 0.1 % Apply 1 application topically 2 (two) times daily. As needed  . valsartan (DIOVAN) 160 MG tablet Take 160 mg by mouth at bedtime.  . vitamin E 180 MG (400 UNITS) capsule Take 400 Units by mouth daily.   No facility-administered encounter medications on file as of 09/14/2020.    Hypertension   BP goal is:  <130/80  Office blood pressures are  BP Readings from Last 3 Encounters:  08/12/20 115/76  04/22/20 (!) 151/74  03/30/20 112/70    Patient is currently controlled on the following medications: valsartan 160 mg  Patient checks BP at home daily  Patient home BP readings are ranging: 113-142/66-89  Patient has tried  these meds in the past: N/A  We discussed diet and exercise extensively  Plan  Continue current medications and control with diet and exercise    ______________ Visit Information SDOH (Social Determinants of Health) assessments performed: Yes.  [name] was given information about Principle Care Management/Remote Patient Monitoring services today including:  1. RPM/PCM service includes personalized support from designated clinical staff supervised by her physician, including individualized plan of care and coordination with other  care providers 2. 24/7 contact phone numbers for assistance for urgent and routine care needs. 3. Standard insurance, coinsurance, copays and deductibles apply for principle care management only during months in which we provide at least 30 minutes of these services. Most  insurances cover these services at 100%, however patients may be responsible for any copay, coinsurance and/or deductible if applicable. This service may help you avoid the need for more expensive face-to-face services. 4. Only one practitioner may furnish and bill the service in a calendar month. 5. The patient may stop PCM/RPM services at any time (effective at the end of the month) by phone call to the office staff.  Patient agreed to services and verbal consent obtained.   Manuela Schwartz, Pharm.D. [facility] [phone] Ext: [phone]

## 2020-09-19 ENCOUNTER — Encounter: Payer: Self-pay | Admitting: Cardiology

## 2020-09-19 ENCOUNTER — Ambulatory Visit: Payer: Medicare HMO | Admitting: Cardiology

## 2020-09-19 ENCOUNTER — Other Ambulatory Visit: Payer: Self-pay

## 2020-09-19 VITALS — Resp 16 | Ht 63.0 in | Wt 119.0 lb

## 2020-09-19 DIAGNOSIS — R55 Syncope and collapse: Secondary | ICD-10-CM | POA: Diagnosis not present

## 2020-09-19 DIAGNOSIS — E782 Mixed hyperlipidemia: Secondary | ICD-10-CM

## 2020-09-19 DIAGNOSIS — I10 Essential (primary) hypertension: Secondary | ICD-10-CM | POA: Diagnosis not present

## 2020-09-19 DIAGNOSIS — I491 Atrial premature depolarization: Secondary | ICD-10-CM

## 2020-09-19 MED ORDER — METOPROLOL SUCCINATE ER 25 MG PO TB24: 25.0000 mg | ORAL_TABLET | Freq: Every day | ORAL | 3 refills | Status: DC

## 2020-09-19 NOTE — Telephone Encounter (Signed)
Valsartan dose decreased to 80 mg as recommended by Dr. Virgina Jock due to new start metoprolol, in setting of potential orthostatic BP reading while in the office.

## 2020-09-19 NOTE — Progress Notes (Signed)
There were no vitals filed for this visit.    Patient referred by Crist Infante, MD for hyperlipidemia  Subjective:   Tina Erickson, female    DOB: 04-27-1946, 75 y.o.   MRN: [id number]    Chief Complaint  Patient presents with  . Syncope   . Hypertension  . Follow-up    4 week    . Results    Monitor     HPI  75 y.o. Caucasian female with controlled hypertension, hyperlipidemia, paroxysmal atrial tachcyardia, h/o pancreatitis, family h/o CAD.  Patient had an episode of presyncope in 07/2020. Her smart watch reportedly showed Afib. I placed her on two week telemetry, details below. Patient has not had any more syncope episodes.     Current Outpatient Medications on File Prior to Visit  Medication Sig Dispense Refill  . Apoaequorin (PREVAGEN PO) Take 1 tablet by mouth daily.     . Cholecalciferol (VITAMIN D) 50 MCG (2000 UT) tablet Take 1 tablet by mouth daily.    Marland Kitchen co-enzyme Q-10 50 MG capsule Take 50 mg by mouth daily.    Marland Kitchen esomeprazole (NEXIUM) 40 MG capsule Take 40 mg by mouth daily.     Marland Kitchen KRILL OIL PO Take 1 capsule by mouth daily.    . milk thistle 175 MG tablet Take 175 mg by mouth daily.     . Multiple Vitamins-Minerals (HAIR SKIN AND NAILS FORMULA) TABS See admin instructions.    . NYSTATIN powder APPLY POWDER TOPICALLY TO AFFECTED AREA TWICE DAILY AS NEEDED 15 g prn  . rosuvastatin (CRESTOR) 20 MG tablet TAKE 1 TABLET BY MOUTH AT BEDTIME 90 tablet 0  . Saccharomyces boulardii (PROBIOTIC) 250 MG CAPS Take 1 capsule by mouth in the morning and at bedtime. 60 capsule 0  . triamcinolone cream (KENALOG) 0.1 % Apply 1 application topically 2 (two) times daily. As needed    . valsartan (DIOVAN) 160 MG tablet Take 160 mg by mouth at bedtime.    . vitamin E 180 MG (400 UNITS) capsule Take 400 Units by mouth daily.     No current facility-administered medications on file prior to visit.    Cardiovascular studies:  Mobile cardiac telemetry 13 days 08/12/2020 -  08/26/2020: Dominant rhythm: Sinus. HR 49-143 bpm. Avg HR 76 bpm, while in sinus rhythm >2000 episodes of SVT/atrial tachcyardia, fastest at 169 bpm for 4 beats, longest for 18 beats at 104 bpm. 9.3% isolated SVE, 8.2% couplets, 7.8% triplets. <1% isolated VE, couplet No atrial fibrillation/atrial flutter/VT/high grade AV block, sinus pause >3sec noted. 0 patient triggered events.   EKG 08/12/2020: Sinus rhythm 80 bpm Occasional PAC    Borderline left arial enlargement   ABI 12/23/2018: This exam reveals normal perfusion of the lower extremity (RABI 0.98 and LABI 0.98).  There is mildly abnormal biphasic waveform in the right DP artery.   Lexiscan Myoview Stress Test 12/22/2018: Stress EKG is non-diagnostic, as this is pharmacological stress test. Myocardial pefusion imaging is normal. Left ventricular ejection fraction is 73% with normal wall motion. Low risk study.  CT abdomen 11/13/2018: 1. No acute findings or explanation for the patient's symptoms. 2. Limited evaluation of the stomach due to incomplete distention and absence of enteric contrast. Possible gastric wall thickening as can be seen with gastritis. 3. Small renal cysts. 4.  Aortic Atherosclerosis (ICD10-I70.0).  Recent labs: 12/24/2019: Glucose N/A, BUN/Cr 14/0.9. EGFR 61. HbA1C N/A Chol 174, TG 61, HDL 77, LDL 85 TSH 1.8 normal   11/03/2018: Glucose 88.  BUN/Cr 11/0.9. eGFR normal. Na/K 139/4.1. Rest of the CMP normal. H/H 11/36. MCV 94. Platelets 244. Chol 296, TG 114, HDL 64, LDL 209 Apolipoprotein B 126 (high) TSH normal  Review of Systems  Cardiovascular: Negative for chest pain, dyspnea on exertion, leg swelling, palpitations and syncope.         Vitals:   09/19/20 1455 09/19/20 1456  Resp:    SpO2: 99% 98%   Orthostatic VS for the past 72 hrs (Last 3 readings):  Orthostatic BP Patient Position BP Location Cuff Size Orthostatic Pulse  09/19/20 1456 124/73 Standing Left Arm Normal 79  09/19/20  1455 125/74 Sitting Left Arm Normal 68  09/19/20 1451 148/76 Supine Left Arm Normal 67     Objective:   Physical Exam Vitals and nursing note reviewed.  Constitutional:      General: She is not in acute distress. Neck:     Vascular: No JVD.  Cardiovascular:     Rate and Rhythm: Normal rate and regular rhythm.     Heart sounds: Normal heart sounds. No murmur heard.   Pulmonary:     Effort: Pulmonary effort is normal.     Breath sounds: Normal breath sounds. No wheezing or rales.           Assessment & Recommendations:   75 y.o. Caucasian female with controlled hypertension, hyperlipidemia, paroxysmal atrial tachcyardia, h/o pancreatitis, family h/o CAD.  Syncope: No recurrence. No arrhthymias to explain syncope. Monitor for now. Borderline orthostatic. Will reduce valsartan down to 80 mg daily, as I start metoprolol to reduce supraventricular ectopy.   Paroxysmal atrial tachycardia: >2000 episodes in two weeks on telemetry, with 9% SVE burden. No Afib noted. Will monitor Apple watch  Hyperlipidemia: LDL down from 209 to 85. Continue Crestor 20 mg daily. Tolerating well. Repeat lipid panel in 3 months  Hypertension: Fairly well controlled.  F/u in 3 months  [place], MD [facility] Pager: [phone] Office: [phone] If no answer Cell [phone]

## 2020-09-24 DIAGNOSIS — I10 Essential (primary) hypertension: Secondary | ICD-10-CM | POA: Diagnosis not present

## 2020-09-27 ENCOUNTER — Other Ambulatory Visit: Payer: Self-pay

## 2020-09-27 ENCOUNTER — Ambulatory Visit
Admission: RE | Admit: 2020-09-27 | Discharge: 2020-09-27 | Disposition: A | Discharge status: Home / Self Care | Payer: Medicare Other | Source: Ambulatory Visit | Attending: Internal Medicine | Admitting: Internal Medicine

## 2020-09-27 ENCOUNTER — Ambulatory Visit: Payer: Medicare Other

## 2020-09-27 DIAGNOSIS — Z1231 Encounter for screening mammogram for malignant neoplasm of breast: Secondary | ICD-10-CM | POA: Diagnosis not present

## 2020-09-28 NOTE — Progress Notes (Signed)
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 Originally seen by Dr Loanne Drilling 02/10/2019 after w/u for epigastric pain, CT abd noted RML tree-in -bud. Subsequent concern of R infrahilar mass. Empiric Augmentin caused GI distress.  Bronchoscopy, R thoracoscopy, RML lobectomy, lymph node sampling 09/29/19- Dr Kipp Brood Path- Granulomatous inflammation, mononuclear cells, Aerobic/Anaerobic CX> rare Propionibacterium sp. AFB cultures not done. "Differential diagnosis for granulomatous inflammation primarily includes  infection and sarcoidosis. AFB and GMS special stains are negative for  acid-fast bacilli and fungal organisms respectively. Immunohistochemical  stain for synaptophysin highlights the carcinoid tumorlet."   CT Abdomen 11/13/18-mild RML tree in bud opacity CT Chest 01/09/19- scattered focal areas of tree-in-bud opacities in lower lobes bilaterally. RML bronchocele. Posterior opacity in RML, 39mm RLL nodule: recommended follow-up Labs - ACE 9, ANA Neg, ANCA Neg CT chest 10/27/19 PET 12/14/19-mild focal accentuated activity with maximum SUV 3.6 which could be postoperative 2. Mild tree-in-bud nodularity in the left lower lobe Quantiferon TB Gold 03/30/20- Neg -------------------------------------------------------------------------------   03/30/20- 73 yoF never smoker followed for granulomatous inflammation/ abscess, addressed with RML lobectomy, neg for CA, neg cultures.Complicated by HTN, Pancreatitis, Gastritis, Hyperlipidemia, Bronchoscopy, R thoracoscopy, RML lobectomy, lymph node sampling 09/29/19- Dr Kipp Brood Path- Granulomatous inflammation, mononuclear cells, Aerobic/Anaerobic CX> rare Propionibacterium sp. AFB cultures not done. PET in June showed accentuated post-op acitivity @ resection site RML and tree in bud  LLL, for f/u. At last visit we gave empiric amoxacillin (GI distress from augmentin. Macrolides  deferred in case future MAIC identified). Cough has improved. Covid vax- 2 Moderna Flu vax- today senior Husband died unexpectedly at home- found on floor 1 month ago.She is depressed and has lost weight. Does have family and friend support.  She has some mild dry cough but denies fever, sweats, nodes. Still a little tender R lat thoracic VATS site as expected.    09/29/20- 68 yoF never smoker followed for granulomatous inflammation/ abscess, addressed with RML lobectomy, neg for CA, neg cultures.Complicated by HTN, Pancreatitis, Gastritis, Hyperlipidemia, PAFib,  Bronchoscopy, R thoracoscopy, RML lobectomy, lymph node sampling 09/29/19- Dr Kipp Brood Path- Granulomatous inflammation, mononuclear cells, Aerobic/Anaerobic CX> rare Propionibacterium sp. AFB cultures not done. PET in June,2021  showed accentuated post-op acitivity @ resection site RML and tree in bud LLL, for f/u. Covid vax-2 Moderna Flu vax- had Still residual tenderness R midaxillary after thoracotomy/ bx. Seat belt bothers. No wheeze or cough, night sweats or acute problems. CXR 03/30/20- FINDINGS: The heart size and mediastinal contours are within normal limits. No pneumothorax or pleural effusion is noted. Left lung is clear. Stable postsurgical changes and scarring are noted in the right hilar region. The visualized skeletal structures are unremarkable. IMPRESSION: No active cardiopulmonary disease.  ROS-see HPI   + = positive Constitutional:   + weight loss, night sweats, fevers, chills, fatigue, lassitude. HEENT:    headaches, difficulty swallowing, tooth/dental problems, sore throat,       sneezing, itching, ear ache, nasal congestion, post nasal drip, snoring CV:   + post VATS chest pain, orthopnea, PND, swelling in lower extremities, anasarca,                                   dizziness, palpitations Resp:   shortness of breath with exertion or at rest.                productive cough,   +  non-productive cough,  coughing up of blood.              change in color of mucus.  wheezing.   Skin:    rash or lesions. GI:  No-   heartburn, indigestion, abdominal pain, nausea, vomiting, diarrhea,                 change in bowel habits, loss of appetite GU: dysuria, change in color of urine, no urgency or frequency.   flank pain. MS:   joint pain, stiffness, decreased range of motion, back pain. Neuro-     nothing unusual Psych:  change in mood or affect.  depression or anxiety.   memory loss.  OBJ- Physical Exam General- Alert, Oriented, Affect-appropriate, Distress- none acute, + slender Skin- rash-none, lesions- none, excoriation- none Lymphadenopathy- none Head- atraumatic            Eyes- Gross vision intact, PERRLA, conjunctivae and secretions clear            Ears- Hearing, canals-normal            Nose- Clear, no-Septal dev, mucus, polyps, erosion, perforation             Throat- Mallampati II , mucosa clear , drainage- none, tonsils- atrophic Neck- flexible , trachea midline, no stridor , thyroid nl, carotid no bruit Chest - symmetrical excursion , unlabored           Heart/CV- RRR , no murmur , no gallop  , no rub, nl s1 s2                           - JVD- none , edema- none, stasis changes- none, varices- none           Lung- clear to P&A, wheeze- none, cough- none , dullness-none, rub- none           Chest wall-  Abd-  Br/ Gen/ Rectal- Not done, not indicated Extrem- cyanosis- none, clubbing, none, atrophy- none, strength- nl Neuro- grossly intact to observation

## 2020-09-29 ENCOUNTER — Ambulatory Visit (INDEPENDENT_AMBULATORY_CARE_PROVIDER_SITE_OTHER): Payer: Medicare HMO | Admitting: Internal Medicine

## 2020-09-29 ENCOUNTER — Ambulatory Visit (INDEPENDENT_AMBULATORY_CARE_PROVIDER_SITE_OTHER): Payer: Medicare HMO

## 2020-09-29 ENCOUNTER — Other Ambulatory Visit: Payer: Self-pay

## 2020-09-29 ENCOUNTER — Encounter: Payer: Self-pay | Admitting: Internal Medicine

## 2020-09-29 VITALS — BP 120/82 | HR 57 | Temp 98.3°F | Ht 63.0 in | Wt 117.0 lb

## 2020-09-29 DIAGNOSIS — F4321 Adjustment disorder with depressed mood: Secondary | ICD-10-CM

## 2020-09-29 DIAGNOSIS — R918 Other nonspecific abnormal finding of lung field: Secondary | ICD-10-CM

## 2020-09-29 DIAGNOSIS — R69 Illness, unspecified: Secondary | ICD-10-CM | POA: Diagnosis not present

## 2020-09-29 DIAGNOSIS — J852 Abscess of lung without pneumonia: Secondary | ICD-10-CM | POA: Diagnosis not present

## 2020-09-29 DIAGNOSIS — Z9889 Other specified postprocedural states: Secondary | ICD-10-CM | POA: Diagnosis not present

## 2020-09-29 NOTE — Patient Instructions (Signed)
Order- CXR    Dx Left lower lobe infiltrate on PET 2021  Please call if we can help

## 2020-10-03 ENCOUNTER — Encounter: Payer: Self-pay | Admitting: *Deleted

## 2020-10-23 NOTE — Assessment & Plan Note (Signed)
She is working through this.

## 2020-10-23 NOTE — Assessment & Plan Note (Signed)
Stable and clinically inactive. Minor residual post-VATS pain . Plan- CXR

## 2020-10-24 DIAGNOSIS — I10 Essential (primary) hypertension: Secondary | ICD-10-CM | POA: Diagnosis not present

## 2020-11-03 ENCOUNTER — Telehealth: Payer: Self-pay

## 2020-11-03 NOTE — Telephone Encounter (Signed)
Blood pressure is acceptable. AS long as she does not have any symptoms of dizziness, I would not worry about it. Encourage liberal hydration. Keep f/u next month.  Thanks MJP

## 2020-11-03 NOTE — Telephone Encounter (Signed)
Patient called that her bp was 105/59 today and she is concern it is too low. Patient does not feel dizzy, headache, no fatigue or feeling like passing out and no nausea or sob patient stated she feels she is taking too much medication please advise

## 2020-11-03 NOTE — Telephone Encounter (Signed)
Patient is aware 

## 2020-11-05 ENCOUNTER — Other Ambulatory Visit: Payer: Self-pay | Admitting: Cardiology

## 2020-11-05 DIAGNOSIS — E782 Mixed hyperlipidemia: Secondary | ICD-10-CM

## 2020-11-11 ENCOUNTER — Telehealth: Payer: Self-pay

## 2020-11-11 NOTE — Telephone Encounter (Signed)
Reviewed readings with pt. Pt continues to have no s/sx of lightheadedness, dizziness, syncopal episodes. Pt responding well to new start metoprolol and valsartan dose decrease. Avg readings over the past week of 112/67, HR: 58. Reviewed ideal SBP readings of ~100-130 mmHg. HR readings have improved since starting metoprolol. Recommended pt hold metoprolol if SBP<100 moving forward. Recommended pt increase fluid intake in the meantime and continue monitoring for now.

## 2020-11-11 NOTE — Telephone Encounter (Signed)
Agree 

## 2020-11-11 NOTE — Telephone Encounter (Signed)
Pt called and stated her bp has been running low. Checked with Blenda Bridegroom and her reading for today was 94/56. She believes this is due to the Metoprolol. Valsartan was doubled to 160 then she was told to cut it in half. Pt had a migraine yesterday but took tylenol and it went away. No other symptoms going along with the low BP. Pt does not want to take the metoprolol anymore.

## 2020-11-23 DIAGNOSIS — I10 Essential (primary) hypertension: Secondary | ICD-10-CM | POA: Diagnosis not present

## 2020-12-19 ENCOUNTER — Ambulatory Visit: Payer: Medicare HMO | Admitting: Cardiology

## 2020-12-22 DIAGNOSIS — I10 Essential (primary) hypertension: Secondary | ICD-10-CM | POA: Diagnosis not present

## 2020-12-23 DIAGNOSIS — Z1212 Encounter for screening for malignant neoplasm of rectum: Secondary | ICD-10-CM | POA: Diagnosis not present

## 2020-12-23 DIAGNOSIS — Z1211 Encounter for screening for malignant neoplasm of colon: Secondary | ICD-10-CM | POA: Diagnosis not present

## 2020-12-27 DIAGNOSIS — I10 Essential (primary) hypertension: Secondary | ICD-10-CM | POA: Diagnosis not present

## 2021-01-19 DIAGNOSIS — D2261 Melanocytic nevi of right upper limb, including shoulder: Secondary | ICD-10-CM | POA: Diagnosis not present

## 2021-01-19 DIAGNOSIS — Z85828 Personal history of other malignant neoplasm of skin: Secondary | ICD-10-CM | POA: Diagnosis not present

## 2021-01-19 DIAGNOSIS — L814 Other melanin hyperpigmentation: Secondary | ICD-10-CM | POA: Diagnosis not present

## 2021-01-19 DIAGNOSIS — D2262 Melanocytic nevi of left upper limb, including shoulder: Secondary | ICD-10-CM | POA: Diagnosis not present

## 2021-01-19 DIAGNOSIS — D225 Melanocytic nevi of trunk: Secondary | ICD-10-CM | POA: Diagnosis not present

## 2021-01-19 DIAGNOSIS — L570 Actinic keratosis: Secondary | ICD-10-CM | POA: Diagnosis not present

## 2021-01-19 DIAGNOSIS — L821 Other seborrheic keratosis: Secondary | ICD-10-CM | POA: Diagnosis not present

## 2021-01-21 ENCOUNTER — Other Ambulatory Visit: Payer: Self-pay | Admitting: Cardiology

## 2021-01-21 DIAGNOSIS — I491 Atrial premature depolarization: Secondary | ICD-10-CM

## 2021-01-23 DIAGNOSIS — M810 Age-related osteoporosis without current pathological fracture: Secondary | ICD-10-CM | POA: Diagnosis not present

## 2021-01-23 DIAGNOSIS — E785 Hyperlipidemia, unspecified: Secondary | ICD-10-CM | POA: Diagnosis not present

## 2021-01-26 DIAGNOSIS — Z0000 Encounter for general adult medical examination without abnormal findings: Secondary | ICD-10-CM | POA: Diagnosis not present

## 2021-01-26 DIAGNOSIS — R921 Mammographic calcification found on diagnostic imaging of breast: Secondary | ICD-10-CM | POA: Diagnosis not present

## 2021-01-26 DIAGNOSIS — E785 Hyperlipidemia, unspecified: Secondary | ICD-10-CM | POA: Diagnosis not present

## 2021-01-26 DIAGNOSIS — I10 Essential (primary) hypertension: Secondary | ICD-10-CM | POA: Diagnosis not present

## 2021-01-26 DIAGNOSIS — R82998 Other abnormal findings in urine: Secondary | ICD-10-CM | POA: Diagnosis not present

## 2021-01-26 DIAGNOSIS — G609 Hereditary and idiopathic neuropathy, unspecified: Secondary | ICD-10-CM | POA: Diagnosis not present

## 2021-01-26 DIAGNOSIS — R32 Unspecified urinary incontinence: Secondary | ICD-10-CM | POA: Diagnosis not present

## 2021-01-26 DIAGNOSIS — Z1331 Encounter for screening for depression: Secondary | ICD-10-CM | POA: Diagnosis not present

## 2021-01-26 DIAGNOSIS — M810 Age-related osteoporosis without current pathological fracture: Secondary | ICD-10-CM | POA: Diagnosis not present

## 2021-01-26 DIAGNOSIS — R918 Other nonspecific abnormal finding of lung field: Secondary | ICD-10-CM | POA: Diagnosis not present

## 2021-01-27 DIAGNOSIS — I10 Essential (primary) hypertension: Secondary | ICD-10-CM | POA: Diagnosis not present

## 2021-02-06 ENCOUNTER — Other Ambulatory Visit: Payer: Self-pay | Admitting: Cardiology

## 2021-02-06 DIAGNOSIS — E782 Mixed hyperlipidemia: Secondary | ICD-10-CM

## 2021-02-20 ENCOUNTER — Other Ambulatory Visit: Payer: Self-pay | Admitting: Cardiology

## 2021-02-20 DIAGNOSIS — I491 Atrial premature depolarization: Secondary | ICD-10-CM

## 2021-02-27 DIAGNOSIS — I10 Essential (primary) hypertension: Secondary | ICD-10-CM | POA: Diagnosis not present

## 2021-03-16 DIAGNOSIS — J029 Acute pharyngitis, unspecified: Secondary | ICD-10-CM | POA: Diagnosis not present

## 2021-03-16 DIAGNOSIS — R5383 Other fatigue: Secondary | ICD-10-CM | POA: Diagnosis not present

## 2021-03-16 DIAGNOSIS — Z1152 Encounter for screening for COVID-19: Secondary | ICD-10-CM | POA: Diagnosis not present

## 2021-03-16 DIAGNOSIS — R058 Other specified cough: Secondary | ICD-10-CM | POA: Diagnosis not present

## 2021-03-16 DIAGNOSIS — J069 Acute upper respiratory infection, unspecified: Secondary | ICD-10-CM | POA: Diagnosis not present

## 2021-03-22 ENCOUNTER — Other Ambulatory Visit: Payer: Self-pay | Admitting: Cardiology

## 2021-03-22 DIAGNOSIS — I491 Atrial premature depolarization: Secondary | ICD-10-CM

## 2021-03-29 DIAGNOSIS — I10 Essential (primary) hypertension: Secondary | ICD-10-CM | POA: Diagnosis not present

## 2021-04-01 IMAGING — MR MR PELVIS W/O CM
4 of 5 series · 33 of 48 positions shown · non-contrast
Comparison: None.

CLINICAL DATA: Bilateral buttock pain for 12-14 years. No known
injury.

EXAM:
MRI PELVIS WITHOUT CONTRAST
TECHNIQUE: Multiplanar, multisequence MR imaging of the pelvis was performed.
No intravenous contrast was administered.

[Series 3: T2 fat-sat · sagittal · 4.0mm · 0.48mm/px · 9 of 59 slices shown]
[im 1/59]
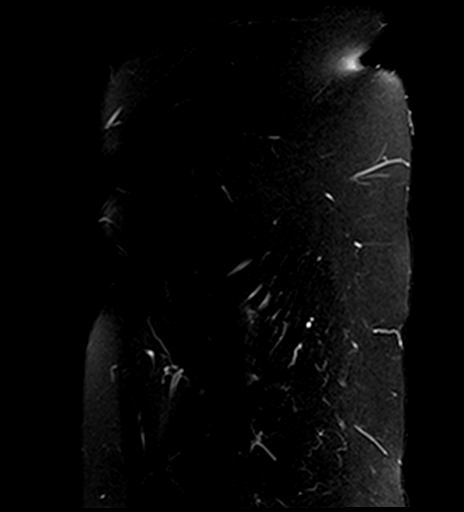
[im 11/59]
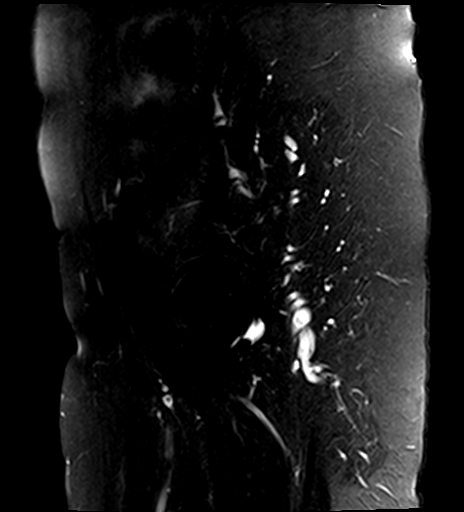
[im 16/59]
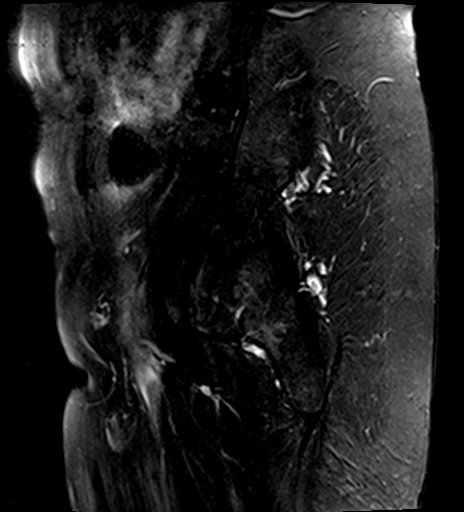
[im 27/59]
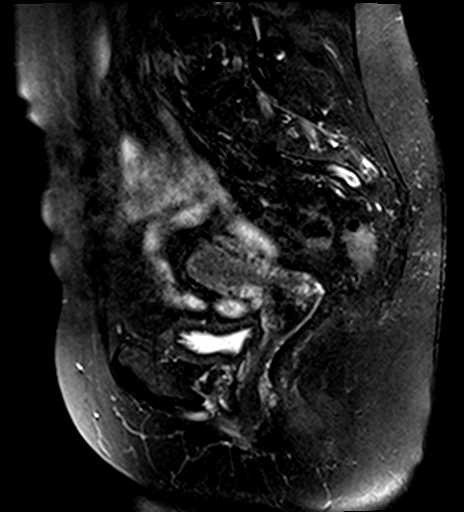
[im 32/59]
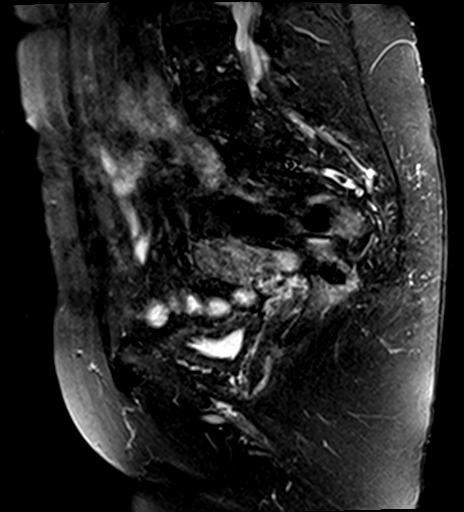
[im 43/59]
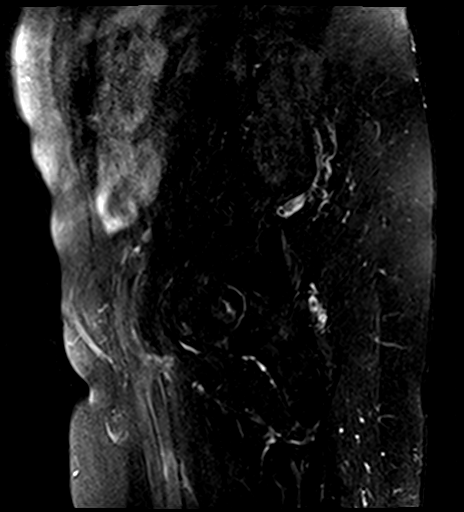
[im 48/59]
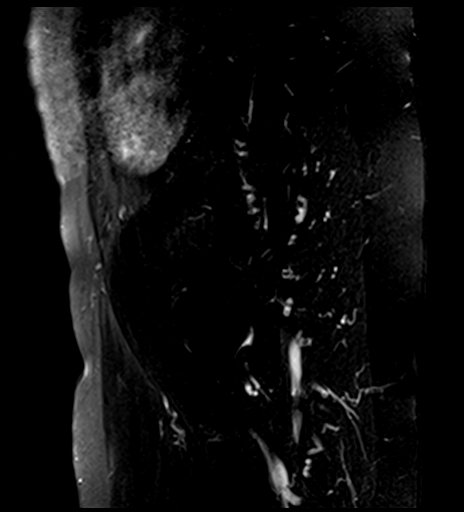
[im 53/59]
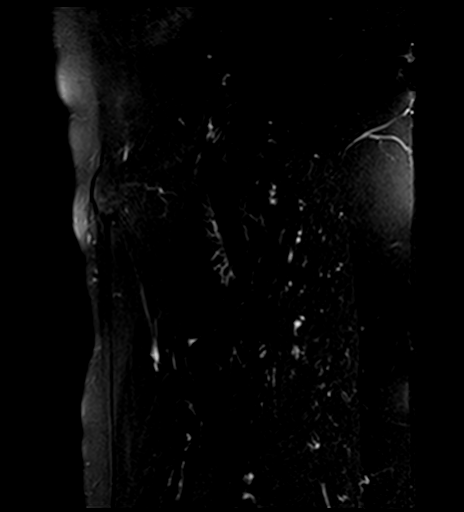
[im 59/59]
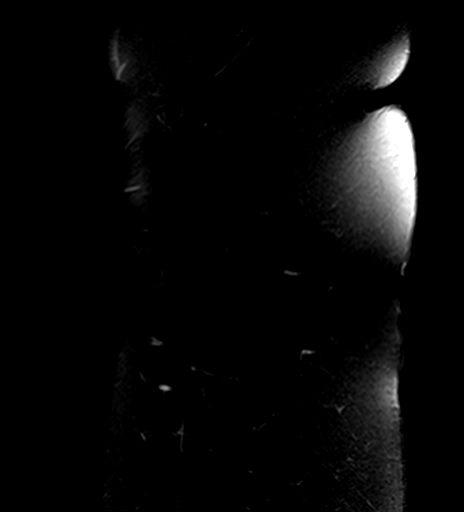

[Series 4: T1 · coronal · 4.0mm · 1.56mm/px · 8 of 36 slices shown (1 of 2)]
[im 1/36]
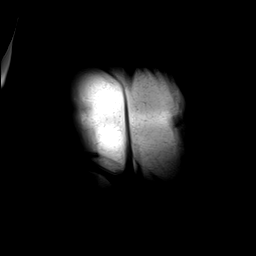
[im 6/36]
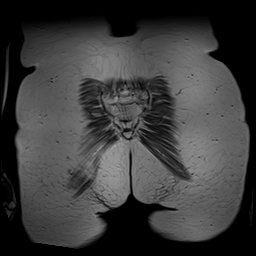
[im 11/36]
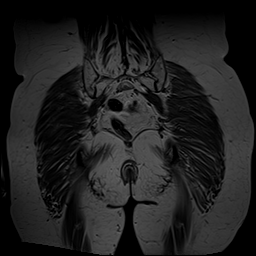
[im 16/36]
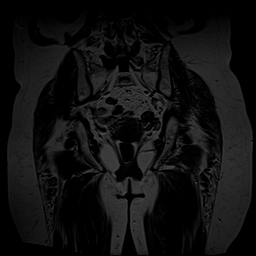
[im 21/36]
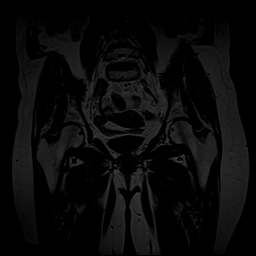
[im 26/36]
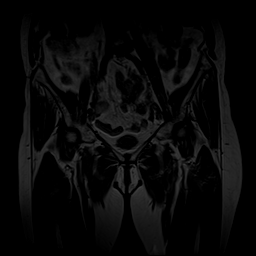
[im 31/36]
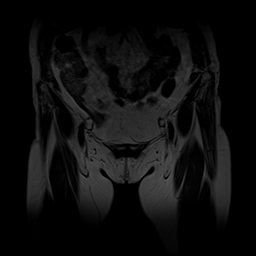
[im 36/36]
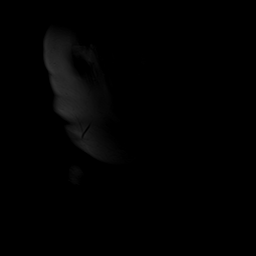

[Series 5: STIR · coronal · 4.0mm · 2.08mm/px · 8 of 36 slices shown]
[im 1/36]
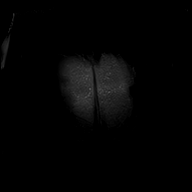
[im 6/36]
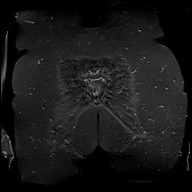
[im 11/36]
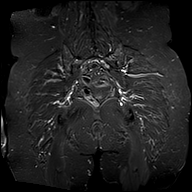
[im 16/36]
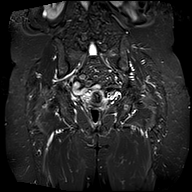
[im 21/36]
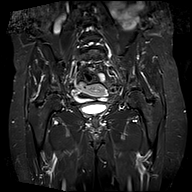
[im 26/36]
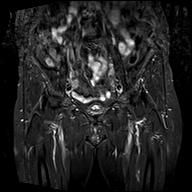
[im 31/36]
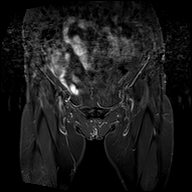
[im 36/36]
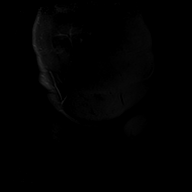

[Series 6: T1 · axial · 4.0mm · 0.59mm/px · z∈[-96,+134]mm · 8 of 47 slices shown (2 of 2)]
[im 1/47]
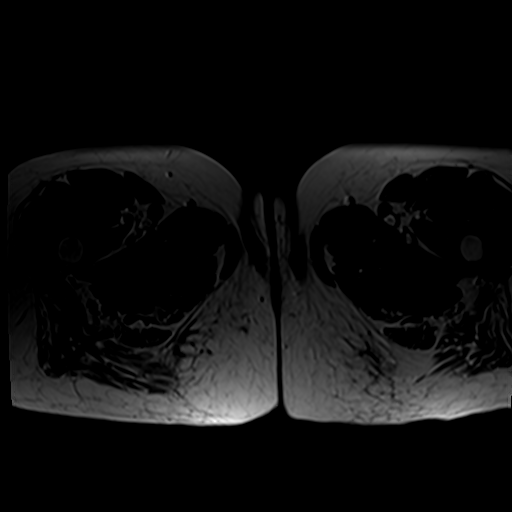
[im 6/47]
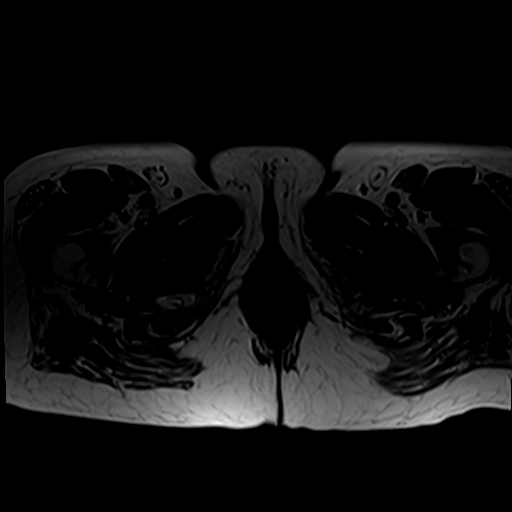
[im 16/47]
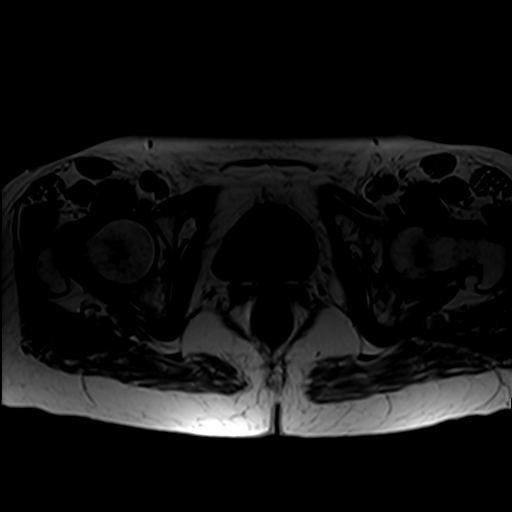
[im 21/47]
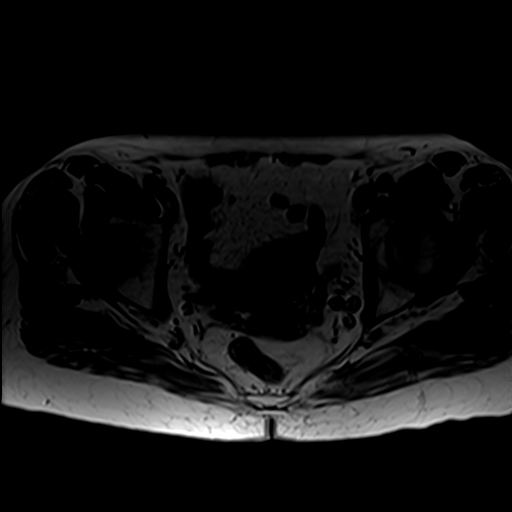
[im 26/47]
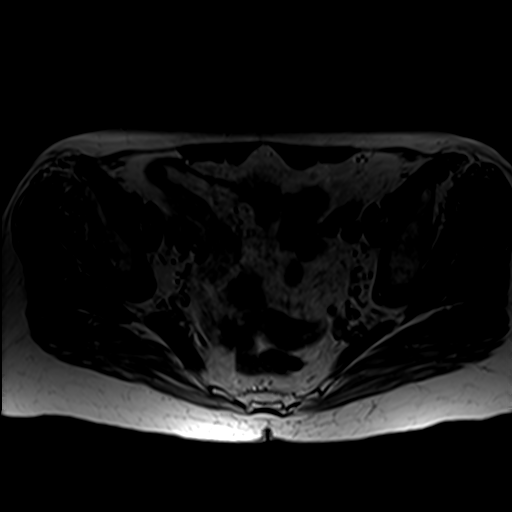
[im 31/47]
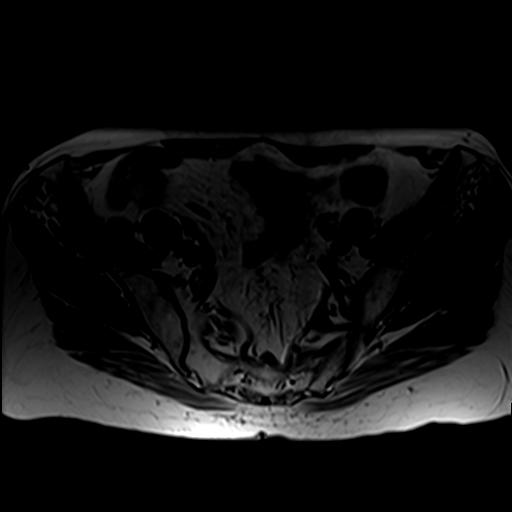
[im 41/47]
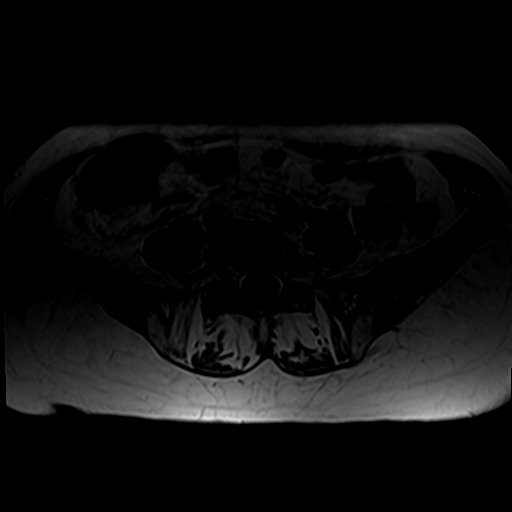
[im 47/47]
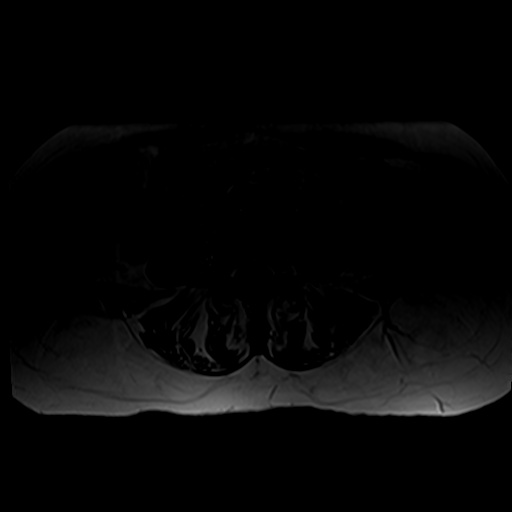

[33 of 48 positions shown; findings below may reference images not displayed]

FINDINGS: Bones/Joint/Cartilage

Marrow signal is normal throughout without fracture, stress change
or focal lesion. No subchondral cyst formation or edema about the
hips. No avascular necrosis of the femoral heads.

Ligaments

Normal.

Muscles and Tendons

Intact and normal in appearance.

Soft tissues

Imaged intrapelvic contents demonstrate sigmoid diverticulosis.
IMPRESSION: No finding to explain the patient's symptoms. The hips and buttocks
appear normal.

Sigmoid diverticulosis.

## 2021-04-04 ENCOUNTER — Ambulatory Visit: Payer: Medicare HMO | Admitting: Internal Medicine

## 2021-04-05 NOTE — Progress Notes (Signed)
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 Originally seen by Dr Tina Erickson 02/10/2019 after w/u for epigastric pain, CT abd noted RML tree-in -bud. Subsequent concern of R infrahilar mass. Empiric Augmentin caused GI distress.  Bronchoscopy, R thoracoscopy, RML lobectomy, lymph node sampling 09/29/19- Dr Tina Erickson Path- Granulomatous inflammation, mononuclear cells, Aerobic/Anaerobic CX> rare Propionibacterium sp. AFB cultures not done. "Differential diagnosis for granulomatous inflammation primarily includes  infection and sarcoidosis.  AFB and GMS special stains are negative for  acid-fast bacilli and fungal organisms respectively. Immunohistochemical  stain for synaptophysin highlights the carcinoid tumorlet."   CT Abdomen 11/13/18-mild RML tree in bud opacity CT Chest 01/09/19- scattered focal areas of tree-in-bud opacities in lower lobes bilaterally. RML bronchocele. Posterior opacity in RML, 60mm RLL nodule: recommended follow-up Labs  2021- ACE 9, ANA Neg, ANCA Neg CT chest 10/27/19 PET 12/14/19-mild focal accentuated activity with maximum SUV 3.6 which could be postoperative 2. Mild tree-in-bud nodularity in the left lower lobe Quantiferon TB Gold 03/30/20- Neg -------------------------------------------------------------------------------   09/29/20- 1 yoF never smoker followed for granulomatous inflammation/ abscess, addressed with RML lobectomy, neg for CA, neg cultures.Complicated by HTN, Pancreatitis, Gastritis, Hyperlipidemia, PAFib,  Bronchoscopy, R thoracoscopy, RML lobectomy, lymph node sampling 09/29/19- Dr Tina Erickson Path- Granulomatous inflammation, mononuclear cells, Aerobic/Anaerobic CX> rare Propionibacterium sp. AFB cultures not done. PET in June,2021  showed accentuated post-op acitivity @ resection site RML and tree in bud LLL, for f/u. Covid vax-2 Moderna Flu vax- had Still residual tenderness R  midaxillary after thoracotomy/ bx. Seat belt bothers. No wheeze or cough, night sweats or acute problems. CXR 03/30/20- FINDINGS: The heart size and mediastinal contours are within normal limits. No pneumothorax or pleural effusion is noted. Left lung is clear. Stable postsurgical changes and scarring are noted in the right hilar region. The visualized skeletal structures are unremarkable. IMPRESSION: No active cardiopulmonary disease.  04/06/21- 74 yoF never smoker followed for granulomatous inflammation/ abscess, addressed with RML lobectomy, neg for CA, neg cultures.Complicated by HTN, Pancreatitis, Gastritis, Hyperlipidemia, PAFib,  Bronchoscopy, R thoracoscopy, RML lobectomy, lymph node sampling 09/29/19- Dr Tina Erickson Path- Granulomatous inflammation, mononuclear cells, Aerobic/Anaerobic CX> rare Propionibacterium sp. AFB cultures not done. PET in June,2021  showed accentuated post-op acitivity @ resection site RML and tree in bud LLL, for f/u. Covid vax-2 Moderna Flu vax-  Quantiferon TB Gold Plus- 03/30/21 negative She reports a respiratory infection from family member a month ago. CXR and Covid test reportedly negative at Dr Tina Erickson's office. Treated with abx that got her better, but now very uncomfortable with vaginal yeast infection. No lingering respiratory symptoms. Walking "slowly" 30 minutes daily.  Still some post-thoracotomy soreness R lateral chest wall, only very slowly better- limits lying on R side.  CXR 09/30/20- FINDINGS: Postoperative changes of right middle lobectomy and wedge resection in the right upper lobe again noted. Lung volumes are normal. No consolidative airspace disease. No pleural effusions. No pneumothorax. No pulmonary nodule or mass noted. Pulmonary vasculature and the cardiomediastinal silhouette are within normal limits. IMPRESSION: 1.  No radiographic evidence of acute cardiopulmonary disease.  ROS-see HPI   + = positive Constitutional:   + weight  loss, night sweats, fevers, chills, fatigue, lassitude. HEENT:    headaches, difficulty swallowing, tooth/dental problems, sore throat,       sneezing, itching, ear ache, nasal congestion, post nasal drip, snoring CV:   + post VATS chest pain, orthopnea, PND, swelling in lower extremities, anasarca,  dizziness, palpitations Resp:   shortness of breath with exertion or at rest.                productive cough,   +non-productive cough, coughing up of blood.              change in color of mucus.  wheezing.   Skin:    rash or lesions. GI:  No-   heartburn, indigestion, abdominal pain, nausea, vomiting, diarrhea,                 change in bowel habits, loss of appetite GU: dysuria, change in color of urine, no urgency or frequency.   flank pain. MS:   joint pain, stiffness, decreased range of motion, back pain. Neuro-     nothing unusual Psych:  change in mood or affect.  depression or anxiety.   memory loss.  OBJ- Physical Exam General- Alert, Oriented, Affect-appropriate, Distress- none acute, + slender Skin- rash-none, lesions- none, excoriation- none Lymphadenopathy- none Head- atraumatic            Eyes- Gross vision intact, PERRLA, conjunctivae and secretions clear            Ears- Hearing, canals-normal            Nose- Clear, no-Septal dev, mucus, polyps, erosion, perforation             Throat- Mallampati II , mucosa clear , drainage- none, tonsils- atrophic Neck- flexible , trachea midline, no stridor , thyroid nl, carotid no bruit Chest - symmetrical excursion , unlabored           Heart/CV- RRR , no murmur , no gallop  , no rub, nl s1 s2                           - JVD- none , edema- none, stasis changes- none, varices- none           Lung- clear to P&A, wheeze- none, cough- none , dullness-none, rub- none           Chest wall-  Abd-  Br/ Gen/ Rectal- Not done, not indicated Extrem- cyanosis- none, clubbing, none, atrophy- none, strength-  nl Neuro- grossly intact to observation

## 2021-04-06 ENCOUNTER — Other Ambulatory Visit: Payer: Self-pay

## 2021-04-06 ENCOUNTER — Ambulatory Visit: Payer: Medicare HMO | Admitting: Internal Medicine

## 2021-04-06 ENCOUNTER — Encounter: Payer: Self-pay | Admitting: Internal Medicine

## 2021-04-06 VITALS — BP 102/60 | HR 67 | Temp 98.0°F | Ht 63.0 in | Wt 116.4 lb

## 2021-04-06 DIAGNOSIS — J852 Abscess of lung without pneumonia: Secondary | ICD-10-CM

## 2021-04-06 DIAGNOSIS — R079 Chest pain, unspecified: Secondary | ICD-10-CM | POA: Diagnosis not present

## 2021-04-06 DIAGNOSIS — Z23 Encounter for immunization: Secondary | ICD-10-CM | POA: Diagnosis not present

## 2021-04-06 MED ORDER — FLUCONAZOLE 150 MG PO TABS: 150.0000 mg | ORAL_TABLET | Freq: Every day | ORAL | 0 refills | Status: DC

## 2021-04-06 NOTE — Assessment & Plan Note (Signed)
No apparent recurrence of granulomatous inflammation We can follow at long intervals. Flu vax today

## 2021-04-06 NOTE — Patient Instructions (Signed)
Script sent for Diflucan for yeast  Consider trying otc pain patch like [place]  for the post-thoracotomy pain in your right side- see if it helps.  Order- Flu vax senior   Please call if we can help

## 2021-04-06 NOTE — Assessment & Plan Note (Signed)
Mild residual post-VATS pain R lat chest wall may takee years to completely fade as discussed.  Plan- she can try otc pain patches. Don't think she needs nerve block at this time.

## 2021-04-19 ENCOUNTER — Other Ambulatory Visit: Payer: Self-pay | Admitting: Cardiology

## 2021-04-19 DIAGNOSIS — I491 Atrial premature depolarization: Secondary | ICD-10-CM

## 2021-04-23 ENCOUNTER — Encounter: Payer: Self-pay | Admitting: Pharmacist

## 2021-04-23 NOTE — Progress Notes (Signed)
CARE PLAN ENTRY  04/23/2021 Name: Tina Erickson MRN: [id number] DOB: 26-Jul-1945  Tina Erickson is enrolled in Remote Patient Monitoring/Principle Care Monitoring.  Date of Enrollment: 04/22/20 Supervising physician: Vernell Leep Indication: HTN  Remote Readings: Compliant and Avg BP: 119/77, HR:65  Next scheduled OV: 05/04/21  Pharmacist Clinical Goal(s):  Over the next 90 days, patient will demonstrate Improved medication adherence as evidenced by medication fill history Over the next 90 days, patient will demonstrate improved understanding of prescribed medications and rationale for usage as evidenced by patient teach back Over the next 90 days, patient will experience decrease in ED visits. ED visits in last 6 months = 0 Over the next 90 days, patient will not experience hospital admission. Hospital Admissions in last 6 months = 0  Interventions: Provider and Inter-disciplinary care team collaboration (see longitudinal plan of care) Comprehensive medication review performed. Discussed plans with patient for ongoing care management follow up and provided patient with direct contact information for care management team Collaboration with provider re: medication management  Patient Self Care Activities:  Self administers medications as prescribed Attends all scheduled provider appointments Performs ADL's independently Performs IADL's independently  Allergies  Allergen Reactions   Pravachol [Pravastatin] Other (See Comments)    Thought it affected her memory and thinking   Prednisone Other (See Comments)    Pt. Stated "it made me crazy"   Outpatient Encounter Medications as of 04/23/2021  Medication Sig   Apoaequorin (PREVAGEN PO) Take 1 tablet by mouth daily.    Cholecalciferol (VITAMIN D) 50 MCG (2000 UT) tablet Take 1 tablet by mouth daily.   co-enzyme Q-10 50 MG capsule Take 50 mg by mouth daily.   esomeprazole (NEXIUM) 40 MG capsule Take 40 mg by mouth daily.     fluconazole (DIFLUCAN) 150 MG tablet Take 1 tablet (150 mg total) by mouth daily.   KRILL OIL PO Take 1 capsule by mouth daily.   metoprolol succinate (TOPROL-XL) 25 MG 24 hr tablet TAKE 1 TABLET BY MOUTH ONCE DAILY IMMEDIATELY  FOLLOWING  A  MEAL   milk thistle 175 MG tablet Take 175 mg by mouth daily.    Multiple Vitamins-Minerals (HAIR SKIN AND NAILS FORMULA) TABS See admin instructions.   NYSTATIN powder APPLY POWDER TOPICALLY TO AFFECTED AREA TWICE DAILY AS NEEDED   rosuvastatin (CRESTOR) 20 MG tablet TAKE 1 TABLET BY MOUTH AT BEDTIME   Saccharomyces boulardii (PROBIOTIC) 250 MG CAPS Take 1 capsule by mouth in the morning and at bedtime.   triamcinolone cream (KENALOG) 0.1 % Apply 1 application topically 2 (two) times daily. As needed   valsartan (DIOVAN) 160 MG tablet Take 80 mg by mouth at bedtime.   vitamin E 180 MG (400 UNITS) capsule Take 400 Units by mouth daily.   No facility-administered encounter medications on file as of 04/23/2021.    Hypertension   BP goal is:  <130/80  Office blood pressures are  BP Readings from Last 3 Encounters:  04/06/21 102/60  09/29/20 120/82  08/12/20 115/76    Patient is currently controlled on the following medications: metoprolol 25 mg, valsartan 80 mg  Patient checks BP at home daily  Patient home BP readings are ranging: 97-145/67-87  Patient has tried  these meds in the past: N/A  We discussed diet and exercise extensively  Plan  Continue current medications and control with diet and exercise   BP continues to remain stable and controlled on current therapy. No new changes. No symptoms or changes  of note. No more concerns of Afib, lightheadedness, CP, palpitation.  ______________ Visit Information SDOH (Social Determinants of Health) assessments performed: Yes.  [name] was given information about Principle Care Management/Remote Patient Monitoring services today including:  RPM/PCM service includes personalized support  from designated clinical staff supervised by her physician, including individualized plan of care and coordination with other care providers 24/7 contact phone numbers for assistance for urgent and routine care needs. Standard insurance, coinsurance, copays and deductibles apply for principle care management only during months in which we provide at least 30 minutes of these services. Most insurances cover these services at 100%, however patients may be responsible for any copay, coinsurance and/or deductible if applicable. This service may help you avoid the need for more expensive face-to-face services. Only one practitioner may furnish and bill the service in a calendar month. The patient may stop PCM/RPM services at any time (effective at the end of the month) by phone call to the office staff.  Patient agreed to services and verbal consent obtained.   Manuela Schwartz, Pharm.D. [facility] [phone] Ext: [phone]

## 2021-04-27 ENCOUNTER — Ambulatory Visit: Payer: Medicare Other | Admitting: Cardiology

## 2021-04-29 DIAGNOSIS — I10 Essential (primary) hypertension: Secondary | ICD-10-CM | POA: Diagnosis not present

## 2021-05-04 ENCOUNTER — Ambulatory Visit: Payer: Medicare Other | Admitting: Cardiology

## 2021-05-06 ENCOUNTER — Other Ambulatory Visit: Payer: Self-pay | Admitting: Cardiology

## 2021-05-06 DIAGNOSIS — E782 Mixed hyperlipidemia: Secondary | ICD-10-CM

## 2021-05-12 ENCOUNTER — Ambulatory Visit: Payer: Medicare HMO | Admitting: Cardiology

## 2021-05-15 ENCOUNTER — Ambulatory Visit: Payer: Medicare HMO | Admitting: Cardiology

## 2021-05-29 ENCOUNTER — Ambulatory Visit: Payer: Medicare HMO | Admitting: Cardiology

## 2021-05-29 ENCOUNTER — Other Ambulatory Visit: Payer: Self-pay

## 2021-05-29 ENCOUNTER — Encounter: Payer: Self-pay | Admitting: Cardiology

## 2021-05-29 VITALS — BP 110/59 | HR 64 | Temp 98.0°F | Resp 17 | Ht 63.0 in | Wt 119.0 lb

## 2021-05-29 DIAGNOSIS — R55 Syncope and collapse: Secondary | ICD-10-CM

## 2021-05-29 DIAGNOSIS — I491 Atrial premature depolarization: Secondary | ICD-10-CM | POA: Diagnosis not present

## 2021-05-29 DIAGNOSIS — E782 Mixed hyperlipidemia: Secondary | ICD-10-CM | POA: Diagnosis not present

## 2021-05-29 DIAGNOSIS — I10 Essential (primary) hypertension: Secondary | ICD-10-CM

## 2021-05-29 NOTE — Progress Notes (Signed)
Patient referred by Crist Infante, MD for hyperlipidemia  Subjective:   Tina Erickson, female    DOB: [birth year], 75 y.o.   MRN: [id number]    Chief Complaint  Patient presents with   Hypertension   Loss of Consciousness   Hyperlipidemia   Follow-up    1 year     HPI  75 y.o. Caucasian female with controlled hypertension, hyperlipidemia, paroxysmal atrial tachcyardia, h/o pancreatitis, family h/o CAD.  Patient has lot of general family related stressors, but denies any cardiac complaints.  She denies any chest pain, shortness of breath.  Blood pressure is very well controlled.  Reviewed recent lipid panel results with the patient, details below.  Current Outpatient Medications on File Prior to Visit  Medication Sig Dispense Refill   Apoaequorin (PREVAGEN PO) Take 1 tablet by mouth daily.      Cholecalciferol (VITAMIN D) 50 MCG (2000 UT) tablet Take 1 tablet by mouth daily.     co-enzyme Q-10 50 MG capsule Take 50 mg by mouth daily.     esomeprazole (NEXIUM) 40 MG capsule Take 40 mg by mouth daily.      fluconazole (DIFLUCAN) 150 MG tablet Take 1 tablet (150 mg total) by mouth daily. 7 tablet 0   KRILL OIL PO Take 1 capsule by mouth daily.     metoprolol succinate (TOPROL-XL) 25 MG 24 hr tablet TAKE 1 TABLET BY MOUTH ONCE DAILY IMMEDIATELY  FOLLOWING  A  MEAL 30 tablet 0   milk thistle 175 MG tablet Take 175 mg by mouth daily.      Multiple Vitamins-Minerals (HAIR SKIN AND NAILS FORMULA) TABS See admin instructions.     NYSTATIN powder APPLY POWDER TOPICALLY TO AFFECTED AREA TWICE DAILY AS NEEDED 15 g prn   rosuvastatin (CRESTOR) 20 MG tablet TAKE 1 TABLET BY MOUTH AT BEDTIME 90 tablet 0   Saccharomyces boulardii (PROBIOTIC) 250 MG CAPS Take 1 capsule by mouth in the morning and at bedtime. 60 capsule 0   triamcinolone cream (KENALOG) 0.1 % Apply 1 application topically 2 (two) times daily. As needed     valsartan (DIOVAN) 160 MG tablet Take 80 mg by mouth at bedtime.      vitamin E 180 MG (400 UNITS) capsule Take 400 Units by mouth daily.     No current facility-administered medications on file prior to visit.    Cardiovascular studies:  EKG 05/29/2021: Sinus rhythm 68 bpm Normal EKG  Mobile cardiac telemetry 13 days 08/12/2020 - 08/26/2020: Dominant rhythm: Sinus. HR 49-143 bpm. Avg HR 76 bpm, while in sinus rhythm >2000 episodes of SVT/atrial tachcyardia, fastest at 169 bpm for 4 beats, longest for 18 beats at 104 bpm. 9.3% isolated SVE, 8.2% couplets, 7.8% triplets. <1% isolated VE, couplet No atrial fibrillation/atrial flutter/VT/high grade AV block, sinus pause >3sec noted. 0 patient triggered events.    ABI 12/23/2018: This exam reveals normal perfusion of the lower extremity (RABI 0.98 and LABI 0.98).  There is mildly abnormal biphasic waveform in the right DP artery.   Lexiscan Myoview Stress Test 12/22/2018: Stress EKG is non-diagnostic, as this is pharmacological stress test. Myocardial pefusion imaging is normal. Left ventricular ejection fraction is 73% with normal wall motion. Low risk study.  CT abdomen 11/13/2018: 1. No acute findings or explanation for the patient's symptoms. 2. Limited evaluation of the stomach due to incomplete distention and absence of enteric contrast. Possible gastric wall thickening as can be seen with gastritis. 3. Small renal cysts. 4.  Aortic Atherosclerosis (ICD10-I70.0).  Recent labs: 01/23/2021: BUN/Cr 11/0.9. EGFR 60. Chol 154, TG 59, HDL 73, LDL 69 TSH 1.7 normal  12/24/2019: Glucose N/A, BUN/Cr 14/0.9. EGFR 61. HbA1C N/A Chol 174, TG 61, HDL 77, LDL 85 TSH 1.8 normal   11/03/2018: Glucose 88. BUN/Cr 11/0.9. eGFR normal. Na/K 139/4.1. Rest of the CMP normal. H/H 11/36. MCV 94. Platelets 244. Chol 296, TG 114, HDL 64, LDL 209 Apolipoprotein B 126 (high) TSH normal  Review of Systems  Cardiovascular:  Negative for chest pain, dyspnea on exertion, leg swelling, palpitations and syncope.         Vitals:   05/29/21 1411  BP: (!) 110/59  Pulse: 64  Resp: 17  Temp: 98 F (36.7 C)  SpO2: 98%     Objective:  Physical Exam Vitals and nursing note reviewed.  Constitutional:      General: She is not in acute distress. Neck:     Vascular: No JVD.  Cardiovascular:     Rate and Rhythm: Normal rate and regular rhythm.     Heart sounds: Normal heart sounds. No murmur heard. Pulmonary:     Effort: Pulmonary effort is normal.     Breath sounds: Normal breath sounds. No wheezing or rales.  Musculoskeletal:     Right lower leg: No edema.     Left lower leg: No edema.            Assessment & Recommendations:   75 y.o. Caucasian female with controlled hypertension, hyperlipidemia, paroxysmal atrial tachcyardia, h/o pancreatitis, family h/o CAD.   Paroxysmal atrial tachycardia: No symptoms currently. Continue metoprolol succinate 25 mg daily.  Mixed hyperlipidemia: Very well controlled on Crestor 20 mg daily. LDL 69 (01/2021)  Hypertension: Well controlled.  F/u in 1 year  Nigel Mormon, MD [facility] Pager: [phone] Office: [phone] If no answer Cell [phone]

## 2021-05-30 DIAGNOSIS — I10 Essential (primary) hypertension: Secondary | ICD-10-CM | POA: Diagnosis not present

## 2021-06-06 DIAGNOSIS — K5900 Constipation, unspecified: Secondary | ICD-10-CM | POA: Diagnosis not present

## 2021-06-06 DIAGNOSIS — K219 Gastro-esophageal reflux disease without esophagitis: Secondary | ICD-10-CM | POA: Diagnosis not present

## 2021-06-08 ENCOUNTER — Other Ambulatory Visit: Payer: Self-pay | Admitting: Cardiology

## 2021-06-08 DIAGNOSIS — I491 Atrial premature depolarization: Secondary | ICD-10-CM

## 2021-06-23 DIAGNOSIS — E785 Hyperlipidemia, unspecified: Secondary | ICD-10-CM | POA: Diagnosis not present

## 2021-06-23 DIAGNOSIS — I10 Essential (primary) hypertension: Secondary | ICD-10-CM | POA: Diagnosis not present

## 2021-06-23 DIAGNOSIS — M810 Age-related osteoporosis without current pathological fracture: Secondary | ICD-10-CM | POA: Diagnosis not present

## 2021-06-29 DIAGNOSIS — I10 Essential (primary) hypertension: Secondary | ICD-10-CM | POA: Diagnosis not present

## 2021-07-30 DIAGNOSIS — I10 Essential (primary) hypertension: Secondary | ICD-10-CM | POA: Diagnosis not present

## 2021-08-02 DIAGNOSIS — S31825A Open bite of left buttock, initial encounter: Secondary | ICD-10-CM | POA: Diagnosis not present

## 2021-08-02 DIAGNOSIS — W540XXA Bitten by dog, initial encounter: Secondary | ICD-10-CM | POA: Diagnosis not present

## 2021-08-02 DIAGNOSIS — M7918 Myalgia, other site: Secondary | ICD-10-CM | POA: Diagnosis not present

## 2021-08-10 ENCOUNTER — Other Ambulatory Visit: Payer: Self-pay | Admitting: Cardiology

## 2021-08-10 DIAGNOSIS — E782 Mixed hyperlipidemia: Secondary | ICD-10-CM

## 2021-08-25 ENCOUNTER — Other Ambulatory Visit: Payer: Self-pay | Admitting: Internal Medicine

## 2021-08-25 DIAGNOSIS — Z1231 Encounter for screening mammogram for malignant neoplasm of breast: Secondary | ICD-10-CM

## 2021-08-29 DIAGNOSIS — I10 Essential (primary) hypertension: Secondary | ICD-10-CM | POA: Diagnosis not present

## 2021-09-04 DIAGNOSIS — Z961 Presence of intraocular lens: Secondary | ICD-10-CM | POA: Diagnosis not present

## 2021-09-04 DIAGNOSIS — H524 Presbyopia: Secondary | ICD-10-CM | POA: Diagnosis not present

## 2021-09-22 DIAGNOSIS — E785 Hyperlipidemia, unspecified: Secondary | ICD-10-CM | POA: Diagnosis not present

## 2021-09-22 DIAGNOSIS — I10 Essential (primary) hypertension: Secondary | ICD-10-CM | POA: Diagnosis not present

## 2021-09-22 DIAGNOSIS — M810 Age-related osteoporosis without current pathological fracture: Secondary | ICD-10-CM | POA: Diagnosis not present

## 2021-09-28 ENCOUNTER — Ambulatory Visit
Admission: RE | Admit: 2021-09-28 | Discharge: 2021-09-28 | Disposition: A | Discharge status: Home / Self Care | Payer: Medicare HMO | Source: Ambulatory Visit | Attending: Internal Medicine | Admitting: Internal Medicine

## 2021-09-28 DIAGNOSIS — Z1231 Encounter for screening mammogram for malignant neoplasm of breast: Secondary | ICD-10-CM | POA: Diagnosis not present

## 2021-09-29 DIAGNOSIS — I10 Essential (primary) hypertension: Secondary | ICD-10-CM | POA: Diagnosis not present

## 2021-10-02 ENCOUNTER — Encounter (HOSPITAL_COMMUNITY): Payer: Self-pay

## 2021-10-02 ENCOUNTER — Ambulatory Visit (HOSPITAL_COMMUNITY)
Admission: EM | Admit: 2021-10-02 | Discharge: 2021-10-02 | Disposition: A | Discharge status: Home / Self Care | Payer: Medicare HMO | Attending: Sports Medicine | Admitting: Sports Medicine

## 2021-10-02 DIAGNOSIS — L237 Allergic contact dermatitis due to plants, except food: Secondary | ICD-10-CM | POA: Diagnosis not present

## 2021-10-02 MED ORDER — DIPHENHYDRAMINE HCL 25 MG PO TABS: 25.0000 mg | ORAL_TABLET | Freq: Four times a day (QID) | ORAL | 0 refills | Status: DC | PRN

## 2021-10-02 MED ORDER — ZANFEL EX MISC: 1.0000 "application " | Freq: Three times a day (TID) | CUTANEOUS | 0 refills | Status: DC

## 2021-10-02 NOTE — ED Triage Notes (Signed)
Pt presents with poison ivy on abdomen X 10 days. ?

## 2021-10-02 NOTE — ED Provider Notes (Signed)
?M[facility]na ? ? ? ?CSN: [id number] ?Arrival date & time: 10/02/21  1126 ? ? ?  ? ?History   ?Chief Complaint ?Chief Complaint  ?Patient presents with  ? Poison Ivy  ? ? ?HPI ?Tina Erickson is a 76 y.o. female here for poison ivy ? ? ?Poison Ivy ? ? ?Patient was working outside and raking leaves about 10 days ago.  The following day or 2 later she developed a very red itchy rash on her abdomen.  She has been treating this with occasional Benadryl, calamine lotion.  She is a started taking cortisone 10 cream, all these help although she still has itchy symptoms and states she has noticed some spreading of the rash into the groin area and another spot on her abdomen.  She has had poison ivy before and states it feels exactly the same.  She denies any spread on her upper extremities. ? ?The patient was asking about a steroid injection today as one of her friends discussed this with her.  On review, she does have an allergy to prednisone, states it "made her crazy."  Patient cannot recall this, although is agreeable it is probably not a good idea to proceed with this. ? ?Tx: Cortizone-10 and benadryl, calamine lotion ? ?Past Medical History:  ?Diagnosis Date  ? Acid reflux   ? just occasionally  ? Arthritis   ? at the base of my spine  ? Gastritis   ? Hyperlipidemia   ? Hypertension   ? Pancreatitis   ? ? ?Patient Active Problem List  ? Diagnosis Date Noted  ? Syncope and collapse 09/19/2020  ? Premature atrial contraction 09/19/2020  ? Grief reaction 03/30/2020  ? Abscess of middle lobe of right lung without pneumonia ([place]) 09/28/2019  ? Essential hypertension 12/31/2018  ? Chest pain of uncertain etiology 12/45/8099  ? Paresthesia 11/19/2018  ? Mixed hyperlipidemia 11/19/2018  ? Family history of early CAD 11/19/2018  ? Pancreatitis   ? Gastritis   ? ? ?Past Surgical History:  ?Procedure Laterality Date  ? BREAST BIOPSY Left 2000  ? CATARACT EXTRACTION, BILATERAL    ? fractured tibia repair    ? INTERCOSTAL  NERVE BLOCK Right 09/28/2019  ? Procedure: Intercostal Nerve Block;  Surgeon: Lajuana Matte, MD;  Location: [place];  Service: Thoracic;  Laterality: Right;  ? NODE DISSECTION Right 09/28/2019  ? Procedure: Node Dissection;  Surgeon: Lajuana Matte, MD;  Location: [place];  Service: Thoracic;  Laterality: Right;  ? RHINOPLASTY    ? VIDEO BRONCHOSCOPY N/A 09/28/2019  ? Procedure: VIDEO BRONCHOSCOPY;  Surgeon: Lajuana Matte, MD;  Location: [place];  Service: Thoracic;  Laterality: N/A;  ? ? ?OB History   ?No obstetric history on file. ?  ? ? ? ?Home Medications   ? ?Prior to Admission medications   ?Medication Sig Start Date End Date Taking? Authorizing Provider  ?diphenhydrAMINE (BENADRYL) 25 MG tablet Take 1 tablet (25 mg total) by mouth every 6 (six) hours as needed. 10/02/21  Yes Elba Barman, DO  ?Poison Ivy Treatments (ZANFEL) MISC Apply 1 application. topically in the morning, at noon, and at bedtime. 10/02/21  Yes Elba Barman, DO  ?Apoaequorin (PREVAGEN PO) Take 1 tablet by mouth daily.     [provider]  ?Cholecalciferol (VITAMIN D) 50 MCG (2000 UT) tablet Take 1 tablet by mouth daily. 03/25/13   [provider]  ?co-enzyme Q-10 50 MG capsule Take 50 mg by mouth daily.  [provider]  ?esomeprazole (NEXIUM) 40 MG capsule Take 40 mg by mouth daily.  01/14/19   [provider]  ?metoprolol succinate (TOPROL-XL) 25 MG 24 hr tablet TAKE 1 TABLET BY MOUTH ONCE DAILY IMMEDIATELY  FOLLOWING  A  MEAL 06/08/21   Patwardhan, Manish J, MD  ?milk thistle 175 MG tablet Take 175 mg by mouth daily.     [provider]  ?NYSTATIN powder APPLY POWDER TOPICALLY TO AFFECTED AREA TWICE DAILY AS NEEDED 01/04/20   Baird Lyons D, MD  ?rosuvastatin (CRESTOR) 20 MG tablet TAKE 1 TABLET BY MOUTH AT BEDTIME 08/10/21   Patwardhan, Manish J, MD  ?Saccharomyces boulardii (PROBIOTIC) 250 MG CAPS Take 1 capsule by mouth in the morning and at bedtime. 10/09/19   Martyn Ehrich, NP   ?triamcinolone cream (KENALOG) 0.1 % Apply 1 application topically 2 (two) times daily. As needed    [provider]  ?valsartan (DIOVAN) 160 MG tablet Take 80 mg by mouth at bedtime. 07/04/20   [provider]  ?vitamin E 180 MG (400 UNITS) capsule Take 400 Units by mouth daily.    [provider]  ? ? ?Family History ?Family History  ?Problem Relation Age of Onset  ? Breast cancer Sister 15  ? Migraines Sister   ? Hernia Mother   ? Migraines Mother   ? Ulcers Mother   ? Heart failure Father   ? Ulcers Father   ? Obesity Sister   ? ? ?Social History ?Social History  ? ?Tobacco Use  ? Smoking status: Never  ? Smokeless tobacco: Never  ?Vaping Use  ? Vaping Use: Never used  ?Substance Use Topics  ? Alcohol use: Yes  ?  Comment: 2 per week  ? Drug use: No  ? ? ? ?Allergies   ?Pravachol [pravastatin] and Prednisone ? ? ?Review of Systems ?Review of Systems  ?Constitutional:  Negative for activity change, chills and fever.  ?Skin:  Positive for rash.  ?     + pruritis  ? ? ?Physical Exam ?Triage Vital Signs ?ED Triage Vitals  ?Enc Vitals Group  ?   BP 10/02/21 1141 (!) 141/74  ?   Pulse Rate 10/02/21 1141 (!) 57  ?   Resp 10/02/21 1141 17  ?   Temp 10/02/21 1141 (!) 97.3 ?F (36.3 ?C)  ?   Temp Source 10/02/21 1141 Oral  ?   SpO2 10/02/21 1141 100 %  ?   Weight --   ?   Height --   ?   Head Circumference --   ?   Peak Flow --   ?   Pain Score 10/02/21 1140 2  ?   Pain Loc --   ?   Pain Edu? --   ?   Excl. in [place]? --   ? ?No data found. ? ?Updated Vital Signs ?BP (!) 141/74 (BP Location: Right Arm)   Pulse (!) 57   Temp (!) 97.3 ?F (36.3 ?C) (Oral)   Resp 17   SpO2 100%  ? ?Physical Exam ?Constitutional:   ?   General: She is not in acute distress. ?   Appearance: She is normal weight. She is not ill-appearing or toxic-appearing.  ?HENT:  ?   Head: Normocephalic and atraumatic.  ?Eyes:  ?   Extraocular Movements: Extraocular movements intact.  ?   Pupils: Pupils are equal, round, and reactive  to light.  ?Pulmonary:  ?   Effort: Pulmonary effort is normal. No respiratory  distress.  ?Skin: ?   General: Skin is warm.  ?   Findings: Rash (+ erythematous, pruritic rash noted on abdomen and upper groin, crosses midline) present.  ?Neurological:  ?   General: No focal deficit present.  ?   Mental Status: She is alert.  ?Psychiatric:     ?   Mood and Affect: Mood normal.  ? ? ? ?UC Treatments / Results  ?Labs ?(all labs ordered are listed, but only abnormal results are displayed) ?Labs Reviewed - No data to display ? ?EKG ? ? ?Radiology ?No results found. ? ?Procedures ?Procedures (including critical care time) ? ?Medications Ordered in UC ?Medications - No data to display ? ?Initial Impression / Assessment and Plan / UC Course  ?I have reviewed the triage vital signs and the nursing notes. ? ?Pertinent labs & imaging results that were available during my care of the patient were reviewed by me and considered in my medical decision making (see chart for details). ? ?  ? ?Patient presents with localized poison ivy to the abdomen and upper groin.  She has been using over-the-counter calamine, Benadryl with some symptom relief although symptoms still persist.  I did discuss using "Zanfel" to help remove the oil to prevent spreading and pruritus improvement.  We did discuss the option of a methylprednisolone IM injection, although on her allergy chart it says that she has an allergy to this as it "makes her feel crazy" so we decided against this today.  Did send a prescription for Benadryl to be taken as needed for itching and irritation.  Follow-up with PCP if needed. ?Final Clinical Impressions(s) / UC Diagnoses  ? ?Final diagnoses:  ?Poison ivy  ? ? ? ?Discharge Instructions   ? ?  ?Pick-up this poison ivy medication: Zanfel ? ?May continue Benadryl, calamine lotion, cortisone 10 for symptom relief ? ?We could also consider a steroid IM injection, but we will not give this today as you do have a history of the  prednisone/steroid medication "making you feel crazy" ? ? ? ? ?ED Prescriptions   ? ? Medication Sig Dispense Auth. Provider  ? Poison Ivy Treatments (ZANFEL) MISC Apply 1 application. topically in the mornin

## 2021-10-02 NOTE — ED Triage Notes (Signed)
Pt presents with poison ivy on abdomen X 10 days that is unrelieved with topical ointments. ?

## 2021-10-02 NOTE — Discharge Instructions (Addendum)
Pick-up this poison ivy medication: Zanfel ? ?May continue Benadryl, calamine lotion, cortisone 10 for symptom relief ? ?We could also consider a steroid IM injection, but we will not give this today as you do have a history of the prednisone/steroid medication "making you feel crazy" ?

## 2021-10-16 DIAGNOSIS — M810 Age-related osteoporosis without current pathological fracture: Secondary | ICD-10-CM | POA: Diagnosis not present

## 2021-10-29 DIAGNOSIS — I10 Essential (primary) hypertension: Secondary | ICD-10-CM | POA: Diagnosis not present

## 2021-11-11 ENCOUNTER — Other Ambulatory Visit: Payer: Self-pay | Admitting: Cardiology

## 2021-11-11 DIAGNOSIS — E782 Mixed hyperlipidemia: Secondary | ICD-10-CM

## 2021-11-29 DIAGNOSIS — I10 Essential (primary) hypertension: Secondary | ICD-10-CM | POA: Diagnosis not present

## 2021-12-22 DIAGNOSIS — E785 Hyperlipidemia, unspecified: Secondary | ICD-10-CM | POA: Diagnosis not present

## 2021-12-22 DIAGNOSIS — I10 Essential (primary) hypertension: Secondary | ICD-10-CM | POA: Diagnosis not present

## 2021-12-22 DIAGNOSIS — M810 Age-related osteoporosis without current pathological fracture: Secondary | ICD-10-CM | POA: Diagnosis not present

## 2021-12-29 DIAGNOSIS — I10 Essential (primary) hypertension: Secondary | ICD-10-CM | POA: Diagnosis not present

## 2022-01-18 DIAGNOSIS — D0439 Carcinoma in situ of skin of other parts of face: Secondary | ICD-10-CM | POA: Diagnosis not present

## 2022-01-18 DIAGNOSIS — Z85828 Personal history of other malignant neoplasm of skin: Secondary | ICD-10-CM | POA: Diagnosis not present

## 2022-01-18 DIAGNOSIS — L821 Other seborrheic keratosis: Secondary | ICD-10-CM | POA: Diagnosis not present

## 2022-01-18 DIAGNOSIS — L814 Other melanin hyperpigmentation: Secondary | ICD-10-CM | POA: Diagnosis not present

## 2022-01-18 DIAGNOSIS — D1801 Hemangioma of skin and subcutaneous tissue: Secondary | ICD-10-CM | POA: Diagnosis not present

## 2022-01-18 DIAGNOSIS — D225 Melanocytic nevi of trunk: Secondary | ICD-10-CM | POA: Diagnosis not present

## 2022-01-18 DIAGNOSIS — D485 Neoplasm of uncertain behavior of skin: Secondary | ICD-10-CM | POA: Diagnosis not present

## 2022-01-29 DIAGNOSIS — I10 Essential (primary) hypertension: Secondary | ICD-10-CM | POA: Diagnosis not present

## 2022-02-05 ENCOUNTER — Other Ambulatory Visit: Payer: Self-pay | Admitting: Cardiology

## 2022-02-05 ENCOUNTER — Telehealth: Payer: Self-pay

## 2022-02-05 DIAGNOSIS — E782 Mixed hyperlipidemia: Secondary | ICD-10-CM

## 2022-02-05 NOTE — Telephone Encounter (Signed)
Pt called to check on chol med that pharmacy advised her had been discontinued. Please call to discuss.//ah

## 2022-02-06 ENCOUNTER — Other Ambulatory Visit: Payer: Self-pay

## 2022-02-06 DIAGNOSIS — E782 Mixed hyperlipidemia: Secondary | ICD-10-CM

## 2022-02-06 MED ORDER — ROSUVASTATIN CALCIUM 20 MG PO TABS: 20.0000 mg | ORAL_TABLET | Freq: Every day | ORAL | 3 refills | Status: DC

## 2022-02-06 NOTE — Telephone Encounter (Signed)
This has been refilled.

## 2022-02-09 ENCOUNTER — Other Ambulatory Visit: Payer: Self-pay

## 2022-02-09 DIAGNOSIS — I10 Essential (primary) hypertension: Secondary | ICD-10-CM

## 2022-02-09 MED ORDER — VALSARTAN 80 MG PO TABS: 80.0000 mg | ORAL_TABLET | Freq: Every day | ORAL | 1 refills | Status: DC

## 2022-02-28 DIAGNOSIS — I10 Essential (primary) hypertension: Secondary | ICD-10-CM | POA: Diagnosis not present

## 2022-03-15 DIAGNOSIS — Z1212 Encounter for screening for malignant neoplasm of rectum: Secondary | ICD-10-CM | POA: Diagnosis not present

## 2022-03-15 DIAGNOSIS — D649 Anemia, unspecified: Secondary | ICD-10-CM | POA: Diagnosis not present

## 2022-03-15 DIAGNOSIS — E785 Hyperlipidemia, unspecified: Secondary | ICD-10-CM | POA: Diagnosis not present

## 2022-03-15 DIAGNOSIS — R7989 Other specified abnormal findings of blood chemistry: Secondary | ICD-10-CM | POA: Diagnosis not present

## 2022-03-15 DIAGNOSIS — M810 Age-related osteoporosis without current pathological fracture: Secondary | ICD-10-CM | POA: Diagnosis not present

## 2022-03-15 DIAGNOSIS — I10 Essential (primary) hypertension: Secondary | ICD-10-CM | POA: Diagnosis not present

## 2022-03-22 ENCOUNTER — Other Ambulatory Visit: Payer: Self-pay | Admitting: Internal Medicine

## 2022-03-22 DIAGNOSIS — M810 Age-related osteoporosis without current pathological fracture: Secondary | ICD-10-CM | POA: Diagnosis not present

## 2022-03-22 DIAGNOSIS — K219 Gastro-esophageal reflux disease without esophagitis: Secondary | ICD-10-CM | POA: Diagnosis not present

## 2022-03-22 DIAGNOSIS — I10 Essential (primary) hypertension: Secondary | ICD-10-CM | POA: Diagnosis not present

## 2022-03-22 DIAGNOSIS — Z0000 Encounter for general adult medical examination without abnormal findings: Secondary | ICD-10-CM | POA: Diagnosis not present

## 2022-03-22 DIAGNOSIS — Z23 Encounter for immunization: Secondary | ICD-10-CM | POA: Diagnosis not present

## 2022-03-22 DIAGNOSIS — R918 Other nonspecific abnormal finding of lung field: Secondary | ICD-10-CM

## 2022-03-22 DIAGNOSIS — R945 Abnormal results of liver function studies: Secondary | ICD-10-CM | POA: Diagnosis not present

## 2022-03-22 DIAGNOSIS — Z1331 Encounter for screening for depression: Secondary | ICD-10-CM | POA: Diagnosis not present

## 2022-03-22 DIAGNOSIS — I8310 Varicose veins of unspecified lower extremity with inflammation: Secondary | ICD-10-CM | POA: Diagnosis not present

## 2022-03-22 DIAGNOSIS — Z1339 Encounter for screening examination for other mental health and behavioral disorders: Secondary | ICD-10-CM | POA: Diagnosis not present

## 2022-03-22 DIAGNOSIS — M4120 Other idiopathic scoliosis, site unspecified: Secondary | ICD-10-CM | POA: Diagnosis not present

## 2022-03-30 DIAGNOSIS — I10 Essential (primary) hypertension: Secondary | ICD-10-CM | POA: Diagnosis not present

## 2022-04-07 NOTE — Progress Notes (Unsigned)
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 Originally seen by Dr Loanne Drilling 02/10/2019 after w/u for epigastric pain, CT abd noted RML tree-in -bud. Subsequent concern of R infrahilar mass. Empiric Augmentin caused GI distress.  Bronchoscopy, R thoracoscopy, RML lobectomy, lymph node sampling 09/29/19- Dr Kipp [facility]- Granulomatous inflammation, mononuclear cells, Aerobic/Anaerobic CX> rare Propionibacterium sp. AFB cultures not done. "Differential diagnosis for granulomatous inflammation primarily includes  infection and sarcoidosis.  AFB and GMS special stains are negative for  acid-fast bacilli and fungal organisms respectively. Immunohistochemical  stain for synaptophysin highlights the carcinoid tumorlet."   CT Abdomen 11/13/18-mild RML tree in bud opacity CT Chest 01/09/19- scattered focal areas of tree-in-bud opacities in lower lobes bilaterally. RML bronchocele. Posterior opacity in RML, 41m RLL nodule: recommended follow-up Labs  2021- ACE 9, ANA Neg, ANCA Neg CT chest 10/27/19 PET 12/14/19-mild focal accentuated activity with maximum SUV 3.6 which could be postoperative 2. Mild tree-in-bud nodularity in the left lower lobe Quantiferon TB Gold 03/30/20- Neg -------------------------------------------------------------------------------    04/06/21- 74 yoF never smoker followed for granulomatous inflammation/ abscess, addressed with RML lobectomy, neg for CA, neg cultures.Complicated by HTN, Pancreatitis, Gastritis, Hyperlipidemia, PAFib,  Bronchoscopy, R thoracoscopy, RML lobectomy, lymph node sampling 09/29/19- Dr LKipp BroodPath- Granulomatous inflammation, mononuclear cells, Aerobic/Anaerobic CX> rare Propionibacterium sp. AFB cultures not done. PET in June,2021  showed accentuated post-op acitivity @ resection site RML and tree in bud LLL, for f/u. Covid vax-2 Moderna Flu vax-  Quantiferon TB Gold Plus-  03/30/21 negative She reports a respiratory infection from family member a month ago. CXR and Covid test reportedly negative at Dr Perini's office. Treated with abx that got her better, but now very uncomfortable with vaginal yeast infection. No lingering respiratory symptoms. Walking "slowly" 30 minutes daily.  Still some post-thoracotomy soreness R lateral chest wall, only very slowly better- limits lying on R side.  CXR 09/30/20- FINDINGS: Postoperative changes of right middle lobectomy and wedge resection in the right upper lobe again noted. Lung volumes are normal. No consolidative airspace disease. No pleural effusions. No pneumothorax. No pulmonary nodule or mass noted. Pulmonary vasculature and the cardiomediastinal silhouette are within normal limits. IMPRESSION: 1.  No radiographic evidence of acute cardiopulmonary disease.  04/09/22- 75 yoF never smoker followed for granulomatous inflammation/ abscess, addressed with RML lobectomy, neg for CA, neg cultures.Complicated by HTN, Pancreatitis, Gastritis, Hyperlipidemia, PAFib,  Bronchoscopy, R thoracoscopy, RML lobectomy, lymph node sampling 09/29/19- Dr LKipp [facility]od[facility]- Granulomatous inflammation, mononuclear cells, Aerobic/Anaerobic CX> rare Propionibacterium sp. AFB cultures not done. Covid vax-2 Moderna Flu vax- had CT chest pending 04/19/22 (Dr PJoylene Draft Denies any cough, phlegm or night sweats. Plays 9-hole golf using cart. Dicussed her vaccines. Bothered by R knee pain- arthritis.   ROS-see HPI   + = positive Constitutional:   + weight loss, night sweats, fevers, chills, fatigue, lassitude. HEENT:    headaches, difficulty swallowing, tooth/dental problems, sore throat,       sneezing, itching, ear ache, nasal congestion, post nasal drip, snoring CV:   + post VATS chest pain, orthopnea, PND, swelling in lower extremities, anasarca,                                   dizziness, palpitations Resp:   shortness of breath with exertion  or at rest.  productive cough,   +non-productive cough, coughing up of blood.              change in color of mucus.  wheezing.   Skin:    rash or lesions. GI:  No-   heartburn, indigestion, abdominal pain, nausea, vomiting, diarrhea,                 change in bowel habits, loss of appetite GU: dysuria, change in color of urine, no urgency or frequency.   flank pain. MS:   +joint pain, stiffness, decreased range of motion, back pain. Neuro-     nothing unusual Psych:  change in mood or affect.  depression or anxiety.   memory loss.  OBJ- Physical Exam General- Alert, Oriented, Affect-appropriate, Distress- none acute, + slender Skin- rash-none, lesions- none, excoriation- none Lymphadenopathy- none Head- atraumatic            Eyes- Gross vision intact, PERRLA, conjunctivae and secretions clear            Ears- Hearing, canals-normal            Nose- Clear, no-Septal dev, mucus, polyps, erosion, perforation             Throat- Mallampati II , mucosa clear , drainage- none, tonsils- atrophic Neck- flexible , trachea midline, no stridor , thyroid nl, carotid no bruit Chest - symmetrical excursion , unlabored           Heart/CV- RRR , no murmur , no gallop  , no rub, nl s1 s2                           - JVD- none , edema- none, stasis changes- none, varices- none           Lung- clear to P&A, wheeze- none, cough- none , dullness-none, rub- none           Chest wall-  Abd-  Br/ Gen/ Rectal- Not done, not indicated Extrem- cyanosis- none, clubbing, none, atrophy- none, strength- nl Neuro- grossly intact to observation

## 2022-04-09 ENCOUNTER — Ambulatory Visit: Payer: Medicare HMO | Admitting: Internal Medicine

## 2022-04-09 ENCOUNTER — Encounter: Payer: Self-pay | Admitting: Internal Medicine

## 2022-04-09 DIAGNOSIS — I491 Atrial premature depolarization: Secondary | ICD-10-CM

## 2022-04-09 DIAGNOSIS — J852 Abscess of lung without pneumonia: Secondary | ICD-10-CM | POA: Diagnosis not present

## 2022-04-09 NOTE — Patient Instructions (Signed)
Glad you are doing well. Please call if we can help 

## 2022-04-09 NOTE — Assessment & Plan Note (Signed)
Not describing palpitations.

## 2022-04-09 NOTE — Assessment & Plan Note (Signed)
She is quite pleased with her status since lobectomy. Minimal symptoms now. Plan- CT chest as planned by Dr Abner Greenspan

## 2022-04-10 DIAGNOSIS — I10 Essential (primary) hypertension: Secondary | ICD-10-CM | POA: Diagnosis not present

## 2022-04-19 ENCOUNTER — Ambulatory Visit
Admission: RE | Admit: 2022-04-19 | Discharge: 2022-04-19 | Disposition: A | Discharge status: Home / Self Care | Payer: Medicare HMO | Source: Ambulatory Visit | Attending: Internal Medicine | Admitting: Internal Medicine

## 2022-04-19 DIAGNOSIS — R911 Solitary pulmonary nodule: Secondary | ICD-10-CM | POA: Diagnosis not present

## 2022-04-19 DIAGNOSIS — R918 Other nonspecific abnormal finding of lung field: Secondary | ICD-10-CM | POA: Diagnosis not present

## 2022-04-19 DIAGNOSIS — I700 Atherosclerosis of aorta: Secondary | ICD-10-CM | POA: Diagnosis not present

## 2022-04-19 DIAGNOSIS — J479 Bronchiectasis, uncomplicated: Secondary | ICD-10-CM | POA: Diagnosis not present

## 2022-04-30 DIAGNOSIS — I10 Essential (primary) hypertension: Secondary | ICD-10-CM | POA: Diagnosis not present

## 2022-05-30 DIAGNOSIS — I10 Essential (primary) hypertension: Secondary | ICD-10-CM | POA: Diagnosis not present

## 2022-05-31 ENCOUNTER — Encounter: Payer: Self-pay | Admitting: Cardiology

## 2022-05-31 ENCOUNTER — Telehealth: Payer: Self-pay

## 2022-05-31 ENCOUNTER — Ambulatory Visit: Payer: Medicare HMO | Admitting: Cardiology

## 2022-05-31 VITALS — BP 118/60 | HR 60 | Resp 16 | Ht 63.0 in | Wt 121.0 lb

## 2022-05-31 DIAGNOSIS — I491 Atrial premature depolarization: Secondary | ICD-10-CM | POA: Diagnosis not present

## 2022-05-31 DIAGNOSIS — R072 Precordial pain: Secondary | ICD-10-CM | POA: Diagnosis not present

## 2022-05-31 DIAGNOSIS — I10 Essential (primary) hypertension: Secondary | ICD-10-CM

## 2022-05-31 DIAGNOSIS — E782 Mixed hyperlipidemia: Secondary | ICD-10-CM | POA: Diagnosis not present

## 2022-05-31 NOTE — Telephone Encounter (Signed)
Patient's home BP is well controlled on current antihypertensive regimen.  Average Systolic BP Level 443.15 mmHg Lowest Systolic BP Level 98 mmHg Highest Systolic BP Level 400 mmHg  05/31/2022 Thursday at 07:48 AM 131 / 75      05/30/2022 Wednesday at 07:37 AM 138 / 84      05/29/2022 Tuesday at 08:18 AM 135 / 83      05/28/2022 Monday at 10:04 AM 135 / 84      05/27/2022 'Sunday at 07:23 AM 132 / 76      05/26/2022 Saturday at 08:40 AM 130 / 80      05/25/2022 Friday at 07:46 AM 134 / 81      05/24/2022 Thursday at 09:07 AM 127 / 81      05/23/2022 Wednesday at 07:57 AM 113 / 72      11'$ /28/2023 Tuesday at 07:36 AM 122 / 73
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
## 2022-06-26 ENCOUNTER — Other Ambulatory Visit: Payer: Medicare HMO

## 2022-06-28 ENCOUNTER — Other Ambulatory Visit: Payer: Self-pay | Admitting: Cardiology

## 2022-06-28 DIAGNOSIS — I491 Atrial premature depolarization: Secondary | ICD-10-CM

## 2022-06-30 DIAGNOSIS — I10 Essential (primary) hypertension: Secondary | ICD-10-CM | POA: Diagnosis not present

## 2022-07-04 ENCOUNTER — Ambulatory Visit: Payer: Medicare HMO | Admitting: Cardiology

## 2022-07-05 DIAGNOSIS — R072 Precordial pain: Secondary | ICD-10-CM | POA: Diagnosis not present

## 2022-07-06 LAB — LIPID PANEL
Chol/HDL Ratio: 2.3 ratio (ref 0.0–4.4)
Cholesterol, Total: 172 mg/dL (ref 100–199)
HDL: 74 mg/dL (ref >39)
LDL Chol Calc (NIH): 77 mg/dL (ref 0–99)
Triglycerides: 121 mg/dL (ref 0–149)
VLDL Cholesterol Cal: 21 mg/dL (ref 5–40)

## 2022-07-09 ENCOUNTER — Other Ambulatory Visit: Payer: Medicare HMO

## 2022-07-10 ENCOUNTER — Ambulatory Visit: Payer: Medicare HMO

## 2022-07-10 DIAGNOSIS — R072 Precordial pain: Secondary | ICD-10-CM | POA: Diagnosis not present

## 2022-07-10 LAB — PCV MYOCARDIAL PERFUSION WO LEXISCAN
Angina Index: 0
ST Depression (mm): 0 mm

## 2022-07-11 NOTE — Progress Notes (Signed)
Patient referred by Crist Infante, MD for hyperlipidemia  Subjective:   Tina Erickson, female    DOB: December 15, 1945, 77 y.o.   MRN: [id number]    Chief Complaint  Patient presents with   Essential hypertension   Hyperlipidemia   Follow-up    Test results      HPI  77 y.o. Caucasian female with controlled hypertension, hyperlipidemia, paroxysmal atrial tachcyardia, h/o pancreatitis, family h/o CAD.  Patient's home blood pressure is overall fairly controlled on current antihypertensive regimen.   Average Systolic BP Level 366.44 mmHg Lowest Systolic BP Level 034 mmHg Highest Systolic BP Level 742 mmHg   07/12/2022 Thursday at 07:58 AM     136 / 89                07/11/2022 Wednesday at 08:05 AM 137 / 86                07/10/2022 Tuesday at 07:42 AM      131 / 82                07/09/2022 Monday at 08:08 AM       131 / 81                07/08/2022 'Sunday at 07:06 AM        127 / 78                07/07/2022 Saturday at 07:48 AM      133 / 84                07/06/2022 Friday at 08:00 AM          127 / 80                07/05/2022 Thursday at 08:02 AM     130 / 81                07/04/2022 Wednesday at 07:56 AM 139 / 91                07/03/2022 Tuesday at 08:39 AM      12'$ 5 / 75  Reviewed recent test results with the patient, details below. According to the patient, walking on treadmill was limited due to knee pain. He reports occasional flutter sensation, lasting only for a few seconds.      Current Outpatient Medications:    Apoaequorin (PREVAGEN PO), Take 1 tablet by mouth daily. , Disp: , Rfl:    Cholecalciferol (VITAMIN D) 50 MCG (2000 UT) tablet, Take 1 tablet by mouth daily., Disp: , Rfl:    co-enzyme Q-10 50 MG capsule, Take 50 mg by mouth daily., Disp: , Rfl:    esomeprazole (NEXIUM) 40 MG capsule, Take 40 mg by mouth daily. , Disp: , Rfl:    metoprolol succinate (TOPROL-XL) 25 MG 24 hr tablet, TAKE 1 TABLET BY MOUTH ONCE DAILY IMMEDIATELY FOLLOWING A MEAL.,  Disp: 90 tablet, Rfl: 0   milk thistle 175 MG tablet, Take 175 mg by mouth daily. , Disp: , Rfl:    NYSTATIN powder, APPLY POWDER TOPICALLY TO AFFECTED AREA TWICE DAILY AS NEEDED, Disp: 15 g, Rfl: prn   rosuvastatin (CRESTOR) 20 MG tablet, Take 1 tablet (20 mg total) by mouth at bedtime., Disp: 90 tablet, Rfl: 3   Saccharomyces boulardii (PROBIOTIC) 250 MG CAPS, Take 1 capsule by mouth in the morning and at bedtime., Disp: 60 capsule, Rfl: 0   sucralfate (CARAFATE)  1 g tablet, Take 1 g by mouth 4 (four) times daily -  with meals and at bedtime., Disp: , Rfl:    triamcinolone cream (KENALOG) 0.1 %, Apply 1 application topically 2 (two) times daily. As needed, Disp: , Rfl:    valsartan (DIOVAN) 80 MG tablet, Take 1 tablet (80 mg total) by mouth at bedtime., Disp: 90 tablet, Rfl: 1   vitamin E 180 MG (400 UNITS) capsule, Take 400 Units by mouth daily., Disp: , Rfl:     Cardiovascular studies:  Exercise nuclear stress test 07/10/2022: Myocardial perfusion is normal. Overall LV systolic function is normal without regional wall motion abnormalities. Stress LV EF: 67%. Normal ECG stress. The patient exercised for 2 minutes and 44 seconds of a Bruce protocol, achieving approximately 4.64 METs & 90% MPHR. Markedly reduced exercise tolerance. No chest pain. Stress terminated due to fatigue. Normal BP response. No previous exam available for comparison. Low risk    EKG 05/31/2022: Sinus rhythm 61 bpm Frequent PACs  Mobile cardiac telemetry 13 days 08/12/2020 - 08/26/2020: Dominant rhythm: Sinus. HR 49-143 bpm. Avg HR 76 bpm, while in sinus rhythm >2000 episodes of SVT/atrial tachcyardia, fastest at 169 bpm for 4 beats, longest for 18 beats at 104 bpm. 9.3% isolated SVE, 8.2% couplets, 7.8% triplets. <1% isolated VE, couplet No atrial fibrillation/atrial flutter/VT/high grade AV block, sinus pause >3sec noted. 0 patient triggered events.    ABI 12/23/2018: This exam reveals normal perfusion of the  lower extremity (RABI 0.98 and LABI 0.98).  There is mildly abnormal biphasic waveform in the right DP artery.   Lexiscan Myoview Stress Test 12/22/2018: Stress EKG is non-diagnostic, as this is pharmacological stress test. Myocardial pefusion imaging is normal. Left ventricular ejection fraction is 73% with normal wall motion. Low risk study.  CT abdomen 11/13/2018: 1. No acute findings or explanation for the patient's symptoms. 2. Limited evaluation of the stomach due to incomplete distention and absence of enteric contrast. Possible gastric wall thickening as can be seen with gastritis. 3. Small renal cysts. 4.  Aortic Atherosclerosis (ICD10-I70.0).  Recent labs: 07/05/2022: Chol 172, TG 121, HDL 74, LDL 77  01/23/2021: BUN/Cr 11/0.9. EGFR 60. Chol 154, TG 59, HDL 73, LDL 69 TSH 1.7 normal  12/24/2019: Glucose N/A, BUN/Cr 14/0.9. EGFR 61. HbA1C N/A Chol 174, TG 61, HDL 77, LDL 85 TSH 1.8 normal   11/03/2018: Glucose 88. BUN/Cr 11/0.9. eGFR normal. Na/K 139/4.1. Rest of the CMP normal. H/H 11/36. MCV 94. Platelets 244. Chol 296, TG 114, HDL 64, LDL 209 Apolipoprotein B 126 (high) TSH normal  Review of Systems  Cardiovascular:  Negative for chest pain, dyspnea on exertion, leg swelling, palpitations and syncope.         Vitals:   07/12/22 1451  BP: 126/69  Pulse: 66  SpO2: 96%     Objective:  Physical Exam Vitals and nursing note reviewed.  Constitutional:      General: She is not in acute distress. Neck:     Vascular: No JVD.  Cardiovascular:     Rate and Rhythm: Normal rate and regular rhythm.     Heart sounds: Normal heart sounds. No murmur heard. Pulmonary:     Effort: Pulmonary effort is normal.     Breath sounds: Normal breath sounds. No wheezing or rales.  Musculoskeletal:     Right lower leg: No edema.     Left lower leg: No edema.             Assessment &  Recommendations:   77 y.o. Caucasian female with controlled hypertension,  hyperlipidemia, paroxysmal atrial tachcyardia, h/o pancreatitis, family h/o CAD.  Precordial pain: Resolved. No ischemia on stress testing (06/2022). Limited exercise capacity likely due to knee pain.  Paroxysmal atrial tachycardia: Occasional symptoms. Continue metoprolol succinate 25 mg daily.  Mixed hyperlipidemia: Very well controlled on Crestor 20 mg daily. Chol 172, TG 121, HDL 74, LDL 77 (06/2022)  Hypertension: Well controlled.  F/u in 3 months    Nigel Mormon, MD Pager: [phone] Office: [phone]

## 2022-07-12 ENCOUNTER — Encounter: Payer: Self-pay | Admitting: Cardiology

## 2022-07-12 ENCOUNTER — Telehealth: Payer: Self-pay

## 2022-07-12 ENCOUNTER — Ambulatory Visit: Payer: Medicare HMO | Admitting: Cardiology

## 2022-07-12 VITALS — BP 126/69 | HR 66 | Ht 63.0 in | Wt 120.2 lb

## 2022-07-12 DIAGNOSIS — R072 Precordial pain: Secondary | ICD-10-CM | POA: Diagnosis not present

## 2022-07-12 DIAGNOSIS — I10 Essential (primary) hypertension: Secondary | ICD-10-CM | POA: Diagnosis not present

## 2022-07-12 DIAGNOSIS — E782 Mixed hyperlipidemia: Secondary | ICD-10-CM

## 2022-07-12 NOTE — Telephone Encounter (Signed)
Patient's home blood pressure is overall fairly controlled on current antihypertensive regimen.  Average Systolic BP Level 507.57 mmHg Lowest Systolic BP Level 322 mmHg Highest Systolic BP Level 567 mmHg  07/12/2022 Thursday at 07:58 AM 136 / 89      07/11/2022 Wednesday at 08:05 AM 137 / 86      07/10/2022 Tuesday at 07:42 AM 131 / 82      07/09/2022 Monday at 08:08 AM 131 / 81      07/08/2022 'Sunday at 07:06 AM 127 / 78      07/07/2022 Saturday at 07:48 AM 133 / 84      07/06/2022 Friday at 08:00 AM 127 / 80      07/05/2022 Thursday at 08:02 AM 130 / 81      07/04/2022 Wednesday at 07:56 AM 139 / 91      01'$ /02/2023 Tuesday at 08:39 AM 125 / 75

## 2022-07-31 DIAGNOSIS — I10 Essential (primary) hypertension: Secondary | ICD-10-CM | POA: Diagnosis not present

## 2022-08-07 DIAGNOSIS — K219 Gastro-esophageal reflux disease without esophagitis: Secondary | ICD-10-CM | POA: Diagnosis not present

## 2022-08-07 DIAGNOSIS — K5909 Other constipation: Secondary | ICD-10-CM | POA: Diagnosis not present

## 2022-08-09 ENCOUNTER — Other Ambulatory Visit: Payer: Self-pay | Admitting: Cardiology

## 2022-08-09 DIAGNOSIS — I10 Essential (primary) hypertension: Secondary | ICD-10-CM

## 2022-08-30 ENCOUNTER — Other Ambulatory Visit: Payer: Self-pay | Admitting: Internal Medicine

## 2022-08-30 DIAGNOSIS — I10 Essential (primary) hypertension: Secondary | ICD-10-CM | POA: Diagnosis not present

## 2022-08-30 DIAGNOSIS — Z1231 Encounter for screening mammogram for malignant neoplasm of breast: Secondary | ICD-10-CM

## 2022-09-07 DIAGNOSIS — H01004 Unspecified blepharitis left upper eyelid: Secondary | ICD-10-CM | POA: Diagnosis not present

## 2022-09-07 DIAGNOSIS — H01001 Unspecified blepharitis right upper eyelid: Secondary | ICD-10-CM | POA: Diagnosis not present

## 2022-09-07 DIAGNOSIS — H5211 Myopia, right eye: Secondary | ICD-10-CM | POA: Diagnosis not present

## 2022-09-07 DIAGNOSIS — Z961 Presence of intraocular lens: Secondary | ICD-10-CM | POA: Diagnosis not present

## 2022-09-07 DIAGNOSIS — H524 Presbyopia: Secondary | ICD-10-CM | POA: Diagnosis not present

## 2022-09-29 DIAGNOSIS — I10 Essential (primary) hypertension: Secondary | ICD-10-CM | POA: Diagnosis not present

## 2022-10-02 ENCOUNTER — Other Ambulatory Visit: Payer: Self-pay | Admitting: Cardiology

## 2022-10-02 DIAGNOSIS — I491 Atrial premature depolarization: Secondary | ICD-10-CM

## 2022-10-08 DIAGNOSIS — M25561 Pain in right knee: Secondary | ICD-10-CM | POA: Diagnosis not present

## 2022-10-11 ENCOUNTER — Ambulatory Visit: Payer: Medicare HMO | Admitting: Cardiology

## 2022-10-11 ENCOUNTER — Telehealth: Payer: Self-pay

## 2022-10-11 NOTE — Telephone Encounter (Signed)
Patient home blood pressure is well controlled on current antihypertensive regimen.  30-d summary Systolic (AM) mmHg -- 128.6 (110.0 - 147.0)  Diastolic (AM) mmHg -- 81.2 (72.0 - 89.0)  Heart Rate (AM) bpm -- 62.9 (54.0 - 74.0)      Systolic (PM) mmHg -- 138.0 (138.0 - 138.0)  Diastolic (PM) mmHg -- 80.0 (80.0 - 80.0)  Heart Rate (PM) bpm -- 62.0 (62.0 - 62.0)  Blood pressure and heart rate 10/11/22 7:19 AM 125 / 81 56     10/10/22 9:01 AM 128 / 72 58     10/09/22 7:21 AM 134 / 81 70     10/08/22 7:08 AM 140 / 86 61     10/07/22 8:01 AM 119 / 75 58     10/06/22 8:10 AM 129 / 82 62     10/05/22 7:50 AM 126 / 77 64     10/04/22 7:27 AM 127 / 79 65     10/03/22 7:26 AM 133 / 89 59

## 2022-10-11 NOTE — Progress Notes (Signed)
Patient referred by Rodrigo Ran, MD for hyperlipidemia  Subjective:   Tina Erickson, female    DOB: 01/22/1946, 77 y.o.   MRN: [id number]    Chief Complaint  Patient presents with   Hypertension   Palpitations   Follow-up     HPI  77 y.o. Caucasian female with controlled hypertension, hyperlipidemia, paroxysmal atrial tachcyardia, h/o pancreatitis, family h/o CAD.  Patient is doing well, denies chest pain, shortness of breath, palpitations, leg edema, orthopnea, PND, TIA/syncope.    Current Outpatient Medications:    Apoaequorin (PREVAGEN PO), Take 1 tablet by mouth daily. , Disp: , Rfl:    Cholecalciferol (VITAMIN D) 50 MCG (2000 UT) tablet, Take 1 tablet by mouth daily., Disp: , Rfl:    co-enzyme Q-10 50 MG capsule, Take 50 mg by mouth daily., Disp: , Rfl:    esomeprazole (NEXIUM) 40 MG capsule, Take 40 mg by mouth daily. , Disp: , Rfl:    metoprolol succinate (TOPROL-XL) 25 MG 24 hr tablet, TAKE 1 TABLET BY MOUTH ONCE DAILY IMMEDIATELY FOLLOWING A MEAL, Disp: 90 tablet, Rfl: 0   milk thistle 175 MG tablet, Take 175 mg by mouth daily. , Disp: , Rfl:    Multiple Vitamins-Minerals (HAIR SKIN AND NAILS FORMULA PO), Take 1 Capful by mouth daily at 2 PM., Disp: , Rfl:    NYSTATIN powder, APPLY POWDER TOPICALLY TO AFFECTED AREA TWICE DAILY AS NEEDED, Disp: 15 g, Rfl: prn   rosuvastatin (CRESTOR) 20 MG tablet, Take 1 tablet (20 mg total) by mouth at bedtime., Disp: 90 tablet, Rfl: 3   Saccharomyces boulardii (PROBIOTIC) 250 MG CAPS, Take 1 capsule by mouth in the morning and at bedtime., Disp: 60 capsule, Rfl: 0   triamcinolone cream (KENALOG) 0.1 %, Apply 1 application topically 2 (two) times daily. As needed, Disp: , Rfl:    valsartan (DIOVAN) 80 MG tablet, TAKE 1 TABLET BY MOUTH AT BEDTIME, Disp: 90 tablet, Rfl: 0   vitamin E 180 MG (400 UNITS) capsule, Take 400 Units by mouth daily., Disp: , Rfl:     Cardiovascular studies:  Exercise nuclear stress test  07/10/2022: Myocardial perfusion is normal. Overall LV systolic function is normal without regional wall motion abnormalities. Stress LV EF: 67%. Normal ECG stress. The patient exercised for 2 minutes and 44 seconds of a Bruce protocol, achieving approximately 4.64 METs & 90% MPHR. Markedly reduced exercise tolerance. No chest pain. Stress terminated due to fatigue. Normal BP response. No previous exam available for comparison. Low risk    EKG 05/31/2022: Sinus rhythm 61 bpm Frequent PACs  Mobile cardiac telemetry 13 days 08/12/2020 - 08/26/2020: Dominant rhythm: Sinus. HR 49-143 bpm. Avg HR 76 bpm, while in sinus rhythm >2000 episodes of SVT/atrial tachcyardia, fastest at 169 bpm for 4 beats, longest for 18 beats at 104 bpm. 9.3% isolated SVE, 8.2% couplets, 7.8% triplets. <1% isolated VE, couplet No atrial fibrillation/atrial flutter/VT/high grade AV block, sinus pause >3sec noted. 0 patient triggered events.    ABI 12/23/2018: This exam reveals normal perfusion of the lower extremity (RABI 0.98 and LABI 0.98).  There is mildly abnormal biphasic waveform in the right DP artery.   Lexiscan Myoview Stress Test 12/22/2018: Stress EKG is non-diagnostic, as this is pharmacological stress test. Myocardial pefusion imaging is normal. Left ventricular ejection fraction is 73% with normal wall motion. Low risk study.  CT abdomen 11/13/2018: 1. No acute findings or explanation for the patient's symptoms. 2. Limited evaluation of the stomach due to  incomplete distention and absence of enteric contrast. Possible gastric wall thickening as can be seen with gastritis. 3. Small renal cysts. 4.  Aortic Atherosclerosis (ICD10-I70.0).  Recent labs: 07/05/2022: Chol 172, TG 121, HDL 74, LDL 77  01/23/2021: BUN/Cr 11/0.9. EGFR 60. Chol 154, TG 59, HDL 73, LDL 69 TSH 1.7 normal  12/24/2019: Glucose N/A, BUN/Cr 14/0.9. EGFR 61. HbA1C N/A Chol 174, TG 61, HDL 77, LDL 85 TSH 1.8  normal   11/03/2018: Glucose 88. BUN/Cr 11/0.9. eGFR normal. Na/K 139/4.1. Rest of the CMP normal. H/H 11/36. MCV 94. Platelets 244. Chol 296, TG 114, HDL 64, LDL 209 Apolipoprotein B 126 (high) TSH normal  Review of Systems  Cardiovascular:  Negative for chest pain, dyspnea on exertion, leg swelling, palpitations and syncope.         Vitals:   10/16/22 1248  BP: 129/73  Pulse: 60  Resp: 16  SpO2: 98%     Objective:  Physical Exam Vitals and nursing note reviewed.  Constitutional:      General: She is not in acute distress. Neck:     Vascular: No JVD.  Cardiovascular:     Rate and Rhythm: Normal rate and regular rhythm.     Heart sounds: Normal heart sounds. No murmur heard. Pulmonary:     Effort: Pulmonary effort is normal.     Breath sounds: Normal breath sounds. No wheezing or rales.  Musculoskeletal:     Right lower leg: No edema.     Left lower leg: No edema.             Assessment & Recommendations:   77 y.o. Caucasian female with controlled hypertension, hyperlipidemia, paroxysmal atrial tachcyardia, h/o pancreatitis, family h/o CAD.  Precordial pain: Resolved. No ischemia on stress testing (06/2022). Limited exercise capacity likely due to knee pain.  Paroxysmal atrial tachycardia: Occasional symptoms. Continue metoprolol succinate 25 mg daily.  Mixed hyperlipidemia: Very well controlled on Crestor 20 mg daily. Chol 172, TG 121, HDL 74, LDL 77 (06/2022)  Hypertension: Well controlled.  No change made to any medications today.  F/u in 3 months    Elder Negus, MD Pager: [phone] Office: [phone]

## 2022-10-15 ENCOUNTER — Ambulatory Visit
Admission: RE | Admit: 2022-10-15 | Discharge: 2022-10-15 | Disposition: A | Discharge status: Home / Self Care | Payer: Medicare HMO | Source: Ambulatory Visit | Attending: Internal Medicine | Admitting: Internal Medicine

## 2022-10-15 DIAGNOSIS — Z1231 Encounter for screening mammogram for malignant neoplasm of breast: Secondary | ICD-10-CM | POA: Diagnosis not present

## 2022-10-16 ENCOUNTER — Encounter: Payer: Self-pay | Admitting: Cardiology

## 2022-10-16 ENCOUNTER — Ambulatory Visit: Payer: Medicare HMO | Admitting: Cardiology

## 2022-10-16 VITALS — BP 129/73 | HR 60 | Resp 16 | Ht 63.0 in | Wt 120.0 lb

## 2022-10-16 DIAGNOSIS — I4719 Other supraventricular tachycardia: Secondary | ICD-10-CM

## 2022-10-16 DIAGNOSIS — E782 Mixed hyperlipidemia: Secondary | ICD-10-CM

## 2022-10-16 DIAGNOSIS — I10 Essential (primary) hypertension: Secondary | ICD-10-CM

## 2022-10-29 DIAGNOSIS — I10 Essential (primary) hypertension: Secondary | ICD-10-CM | POA: Diagnosis not present

## 2022-11-14 ENCOUNTER — Ambulatory Visit
Admission: EM | Admit: 2022-11-14 | Discharge: 2022-11-14 | Disposition: A | Discharge status: Home / Self Care | Payer: Medicare HMO | Attending: Internal Medicine | Admitting: Internal Medicine

## 2022-11-14 DIAGNOSIS — J029 Acute pharyngitis, unspecified: Secondary | ICD-10-CM | POA: Insufficient documentation

## 2022-11-14 DIAGNOSIS — Z1152 Encounter for screening for COVID-19: Secondary | ICD-10-CM | POA: Diagnosis not present

## 2022-11-14 DIAGNOSIS — R051 Acute cough: Secondary | ICD-10-CM | POA: Insufficient documentation

## 2022-11-14 DIAGNOSIS — R490 Dysphonia: Secondary | ICD-10-CM | POA: Diagnosis not present

## 2022-11-14 LAB — POCT RAPID STREP A (OFFICE): Rapid Strep A Screen: NEGATIVE

## 2022-11-14 LAB — SARS CORONAVIRUS 2 (TAT 6-24 HRS): SARS Coronavirus 2: NEGATIVE

## 2022-11-14 MED ORDER — BENZONATATE 100 MG PO CAPS: 100.0000 mg | ORAL_CAPSULE | Freq: Three times a day (TID) | ORAL | 0 refills | Status: DC | PRN

## 2022-11-14 NOTE — Discharge Instructions (Signed)
Strep is negative.  Throat culture and COVID test pending.  Suspect viral cause to your symptoms as we discussed.  Recommend voice rest, fluids, sleeping with humidifier to help with hoarseness.  Cough medication has been prescribed for you.  Follow-up if any symptoms persist or worsen.

## 2022-11-14 NOTE — ED Provider Notes (Signed)
[facility]    CSN: [id number] Arrival date & time: 11/14/22  0941      History   Chief Complaint Chief Complaint  Patient presents with   Sore Throat    HPI Tina Erickson is a 77 y.o. female.   Patient presents with throat irritation, hoarseness, cough.  Patient reports cough is nonproductive and started about 5 days ago.  Denies nasal congestion, runny nose, fever.  Denies any known sick contacts.  Throat irritation and hoarseness started yesterday.  Patient has taken cough drops for symptoms with minimal improvement in symptoms.  Denies history of asthma or COPD and patient does not smoke cigarettes.  Denies chest pain or shortness of breath.   Sore Throat    Past Medical History:  Diagnosis Date   Acid reflux    just occasionally   Arthritis    at the base of my spine   Gastritis    Hyperlipidemia    Hypertension    Pancreatitis     Patient Active Problem List   Diagnosis Date Noted   Paroxysmal atrial tachycardia 10/16/2022   Syncope and collapse 09/19/2020   Premature atrial contraction 09/19/2020   Grief reaction 03/30/2020   Abscess of middle lobe of right lung without pneumonia (HCC) 09/28/2019   Essential hypertension 12/31/2018   Precordial pain 11/19/2018   Paresthesia 11/19/2018   Mixed hyperlipidemia 11/19/2018   Family history of early CAD 11/19/2018   Pancreatitis    Gastritis     Past Surgical History:  Procedure Laterality Date   BREAST BIOPSY Left 2000   CATARACT EXTRACTION, BILATERAL     fractured tibia repair     INTERCOSTAL NERVE BLOCK Right 09/28/2019   Procedure: Intercostal Nerve Block;  Surgeon: Corliss Skains, MD;  Location: [facility] OR;  Service: Thoracic;  Laterality: Right;   NODE DISSECTION Right 09/28/2019   Procedure: Node Dissection;  Surgeon: Corliss Skains, MD;  Location: [facility] OR;  Service: Thoracic;  Laterality: Right;   RHINOPLASTY     VIDEO BRONCHOSCOPY N/A 09/28/2019   Procedure: VIDEO BRONCHOSCOPY;   Surgeon: Corliss Skains, MD;  Location: [facility] OR;  Service: Thoracic;  Laterality: N/A;    OB History   No obstetric history on file.      Home Medications    Prior to Admission medications   Medication Sig Start Date End Date Taking? Authorizing Provider  benzonatate (TESSALON) 100 MG capsule Take 1 capsule (100 mg total) by mouth every 8 (eight) hours as needed for cough. 11/14/22  Yes Darian Ace, Wally Shevchenko E, FNP  Apoaequorin (PREVAGEN PO) Take 1 tablet by mouth daily.     [provider]  Cholecalciferol (VITAMIN D) 50 MCG (2000 UT) tablet Take 1 tablet by mouth daily. 03/25/13   [provider]  co-enzyme Q-10 50 MG capsule Take 50 mg by mouth daily.    [provider]  esomeprazole (NEXIUM) 40 MG capsule Take 40 mg by mouth daily.  01/14/19   [provider]  Providence Lanius (OMEGA-3) 500 MG CAPS Take by mouth. 09/24/19   [provider]  metoprolol succinate (TOPROL-XL) 25 MG 24 hr tablet TAKE 1 TABLET BY MOUTH ONCE DAILY IMMEDIATELY FOLLOWING A MEAL 10/02/22   Patwardhan, Manish J, MD  milk thistle 175 MG tablet Take 175 mg by mouth daily.     [provider]  Multiple Vitamins-Minerals (HAIR SKIN AND NAILS FORMULA PO) Take 1 Capful by mouth daily at 2 PM.    [provider]  rosuvastatin (CRESTOR) 20 MG tablet Take 1 tablet (20 mg total) by mouth at bedtime. 02/06/22   Patwardhan, Anabel Bene, MD  Saccharomyces boulardii (PROBIOTIC) 250 MG CAPS Take 1 capsule by mouth in the morning and at bedtime. 10/09/19   Glenford Bayley, NP  triamcinolone cream (KENALOG) 0.1 % Apply 1 application topically 2 (two) times daily. As needed    [provider]  valsartan (DIOVAN) 80 MG tablet TAKE 1 TABLET BY MOUTH AT BEDTIME 08/09/22   Patwardhan, Manish J, MD  vitamin E 180 MG (400 UNITS) capsule Take 400 Units by mouth daily.    [provider]    Family History Family History  Problem Relation Age of Onset   Breast cancer Sister  66   Migraines Sister    Hernia Mother    Migraines Mother    Ulcers Mother    Heart failure Father    Ulcers Father    Obesity Sister     Social History Social History   Tobacco Use   Smoking status: Never   Smokeless tobacco: Never  Vaping Use   Vaping Use: Never used  Substance Use Topics   Alcohol use: Yes    Comment: 2 per week   Drug use: No     Allergies   Pravachol [pravastatin] and Prednisone   Review of Systems Review of Systems Per HPI  Physical Exam Triage Vital Signs ED Triage Vitals [11/14/22 1025]  Enc Vitals Group     BP 119/74     Pulse Rate 67     Resp 16     Temp 98.2 F (36.8 C)     Temp Source Oral     SpO2 97 %     Weight      Height      Head Circumference      Peak Flow      Pain Score 4     Pain Loc      Pain Edu?      Excl. in GC?    No data found.  Updated Vital Signs BP 119/74 (BP Location: Right Arm)   Pulse 67   Temp 98.2 F (36.8 C) (Oral)   Resp 16   SpO2 97%   Visual Acuity Right Eye Distance:   Left Eye Distance:   Bilateral Distance:    Right Eye Near:   Left Eye Near:    Bilateral Near:     Physical Exam Constitutional:      General: She is not in acute distress.    Appearance: Normal appearance. She is not toxic-appearing or diaphoretic.  HENT:     Head: Normocephalic and atraumatic.     Right Ear: Tympanic membrane and ear canal normal.     Left Ear: Tympanic membrane and ear canal normal.     Nose: Congestion present.     Mouth/Throat:     Mouth: Mucous membranes are moist.     Pharynx: Posterior oropharyngeal erythema present.  Eyes:     Extraocular Movements: Extraocular movements intact.     Conjunctiva/sclera: Conjunctivae normal.     Pupils: Pupils are equal, round, and reactive to light.  Cardiovascular:     Rate and Rhythm: Normal rate and regular rhythm.     Pulses: Normal pulses.     Heart sounds: Normal heart sounds.  Pulmonary:     Effort: Pulmonary effort is normal. No  respiratory distress.     Breath sounds: Normal breath sounds. No stridor.  No wheezing, rhonchi or rales.  Abdominal:     General: Abdomen is flat. Bowel sounds are normal.     Palpations: Abdomen is soft.  Musculoskeletal:        General: Normal range of motion.     Cervical back: Normal range of motion.  Skin:    General: Skin is warm and dry.  Neurological:     General: No focal deficit present.     Mental Status: She is alert and oriented to person, place, and time. Mental status is at baseline.  Psychiatric:        Mood and Affect: Mood normal.        Behavior: Behavior normal.      UC Treatments / Results  Labs (all labs ordered are listed, but only abnormal results are displayed) Labs Reviewed  CULTURE, GROUP A STREP ([facility])  SARS CORONAVIRUS 2 (TAT 6-24 HRS)  POCT RAPID STREP A (OFFICE)    EKG   Radiology No results found.  Procedures Procedures (including critical care time)  Medications Ordered in UC Medications - No data to display  Initial Impression / Assessment and Plan / UC Course  I have reviewed the triage vital signs and the nursing notes.  Pertinent labs & imaging results that were available during my care of the patient were reviewed by me and considered in my medical decision making (see chart for details).     Likely viral symptoms.  Rapid strep was negative.  Throat culture pending.  COVID test pending as well.  Advised that hoarseness is most likely due to throat irritation and advised supportive care for this including voice rest, fluids, warm teas with honey, sleeping with a humidifier.  There are no adventitious lung sounds on exam so do not think that chest imaging is necessary.  Benzonatate prescribed to take as needed for cough.  Advised strict return precautions and follow-up.  Patient verbalized understanding and was agreeable with plan. Final Clinical Impressions(s) / UC Diagnoses   Final diagnoses:  Acute cough  Sore throat   Hoarseness     Discharge Instructions      Strep is negative.  Throat culture and COVID test pending.  Suspect viral cause to your symptoms as we discussed.  Recommend voice rest, fluids, sleeping with humidifier to help with hoarseness.  Cough medication has been prescribed for you.  Follow-up if any symptoms persist or worsen.     ED Prescriptions     Medication Sig Dispense Auth. Provider   benzonatate (TESSALON) 100 MG capsule Take 1 capsule (100 mg total) by mouth every 8 (eight) hours as needed for cough. 21 capsule [place]anch[place], Acie Fredrickson, [place]      PDMP not reviewed this encounter.   Gustavus Bryant, [place] 11/14/22 1105

## 2022-11-14 NOTE — ED Triage Notes (Signed)
Pt c/o dry throat, hoarse voice  Onset ~ 1 week ago

## 2022-11-16 LAB — CULTURE, GROUP A STREP (THRC)

## 2022-11-28 DIAGNOSIS — I10 Essential (primary) hypertension: Secondary | ICD-10-CM | POA: Diagnosis not present

## 2022-12-06 ENCOUNTER — Encounter: Payer: Self-pay | Admitting: *Deleted

## 2022-12-06 ENCOUNTER — Ambulatory Visit
Admission: EM | Admit: 2022-12-06 | Discharge: 2022-12-06 | Disposition: A | Discharge status: Home / Self Care | Payer: Medicare HMO | Attending: Family Medicine | Admitting: Family Medicine

## 2022-12-06 ENCOUNTER — Other Ambulatory Visit: Payer: Self-pay

## 2022-12-06 DIAGNOSIS — K529 Noninfective gastroenteritis and colitis, unspecified: Secondary | ICD-10-CM | POA: Diagnosis not present

## 2022-12-06 DIAGNOSIS — R070 Pain in throat: Secondary | ICD-10-CM | POA: Insufficient documentation

## 2022-12-06 LAB — POCT RAPID STREP A (OFFICE): Rapid Strep A Screen: NEGATIVE

## 2022-12-06 NOTE — ED Provider Notes (Signed)
[facility]    CSN: [id number] Arrival date & time: 12/06/22  1011      History   Chief Complaint Chief Complaint  Patient presents with   Sore Throat   Diarrhea    HPI Tina Erickson is a 77 y.o. female.    Sore Throat  Diarrhea  Here for sore throat that began bothering her yesterday.  Its mostly when she awakens, but then after eating yesterday it got better.  It is bothering her again this morning.  She also has had 2 episodes of loose stools this morning.  No nausea or vomiting, and no fever or chills.  No cough or nasal congestion    Past Medical History:  Diagnosis Date   Acid reflux    just occasionally   Arthritis    at the base of my spine   Gastritis    Hyperlipidemia    Hypertension    Pancreatitis     Patient Active Problem List   Diagnosis Date Noted   Paroxysmal atrial tachycardia 10/16/2022   Syncope and collapse 09/19/2020   Premature atrial contraction 09/19/2020   Grief reaction 03/30/2020   Abscess of middle lobe of right lung without pneumonia (HCC) 09/28/2019   Essential hypertension 12/31/2018   Precordial pain 11/19/2018   Paresthesia 11/19/2018   Mixed hyperlipidemia 11/19/2018   Family history of early CAD 11/19/2018   Pancreatitis    Gastritis     Past Surgical History:  Procedure Laterality Date   BREAST BIOPSY Left 2000   CATARACT EXTRACTION, BILATERAL     fractured tibia repair     INTERCOSTAL NERVE BLOCK Right 09/28/2019   Procedure: Intercostal Nerve Block;  Surgeon: Corliss Skains, MD;  Location: [facility] OR;  Service: Thoracic;  Laterality: Right;   NODE DISSECTION Right 09/28/2019   Procedure: Node Dissection;  Surgeon: Corliss Skains, MD;  Location: [facility] OR;  Service: Thoracic;  Laterality: Right;   RHINOPLASTY     VIDEO BRONCHOSCOPY N/A 09/28/2019   Procedure: VIDEO BRONCHOSCOPY;  Surgeon: Corliss Skains, MD;  Location: [facility] OR;  Service: Thoracic;  Laterality: N/A;    OB History   No  obstetric history on file.      Home Medications    Prior to Admission medications   Medication Sig Start Date End Date Taking? Authorizing Provider  Cholecalciferol (VITAMIN D) 50 MCG (2000 UT) tablet Take 1 tablet by mouth daily. 03/25/13  Yes [provider]  co-enzyme Q-10 50 MG capsule Take 50 mg by mouth daily.   Yes [provider]  Providence Lanius (OMEGA-3) 500 MG CAPS Take by mouth. 09/24/19  Yes [provider]  metoprolol succinate (TOPROL-XL) 25 MG 24 hr tablet TAKE 1 TABLET BY MOUTH ONCE DAILY IMMEDIATELY FOLLOWING A MEAL 10/02/22  Yes Patwardhan, Manish J, MD  milk thistle 175 MG tablet Take 175 mg by mouth daily.    Yes [provider]  Multiple Vitamins-Minerals (HAIR SKIN AND NAILS FORMULA PO) Take 1 Capful by mouth daily at 2 PM.   Yes [provider]  rosuvastatin (CRESTOR) 20 MG tablet Take 1 tablet (20 mg total) by mouth at bedtime. 02/06/22  Yes Patwardhan, Anabel Bene, MD  Saccharomyces boulardii (PROBIOTIC) 250 MG CAPS Take 1 capsule by mouth in the morning and at bedtime. 10/09/19  Yes Glenford Bayley, NP  triamcinolone cream (KENALOG) 0.1 % Apply 1 application topically 2 (two) times daily. As needed   Yes [provider]  vitamin E  180 MG (400 UNITS) capsule Take 400 Units by mouth daily.   Yes [provider]  Apoaequorin (PREVAGEN PO) Take 1 tablet by mouth daily.     [provider]  esomeprazole (NEXIUM) 40 MG capsule Take 40 mg by mouth daily.  01/14/19   [provider]  valsartan (DIOVAN) 80 MG tablet TAKE 1 TABLET BY MOUTH AT BEDTIME 08/09/22   Patwardhan, Anabel Bene, MD    Family History Family History  Problem Relation Age of Onset   Breast cancer Sister 70   Migraines Sister    Hernia Mother    Migraines Mother    Ulcers Mother    Heart failure Father    Ulcers Father    Obesity Sister     Social History Social History   Tobacco Use   Smoking status: Never   Smokeless  tobacco: Never  Vaping Use   Vaping Use: Never used  Substance Use Topics   Alcohol use: Yes    Comment: 2 per week   Drug use: No     Allergies   Pravachol [pravastatin] and Prednisone   Review of Systems Review of Systems  Gastrointestinal:  Positive for diarrhea.     Physical Exam Triage Vital Signs ED Triage Vitals  Enc Vitals Group     BP 12/06/22 1039 126/76     Pulse Rate 12/06/22 1039 (!) 58     Resp 12/06/22 1039 18     Temp 12/06/22 1039 97.7 F (36.5 C)     Temp src --      SpO2 12/06/22 1039 97 %     Weight --      Height --      Head Circumference --      Peak Flow --      Pain Score 12/06/22 1035 5     Pain Loc --      Pain Edu? --      Excl. in GC? --    No data found.  Updated Vital Signs BP 126/76   Pulse (!) 58   Temp 97.7 F (36.5 C)   Resp 18   SpO2 97%   Visual Acuity Right Eye Distance:   Left Eye Distance:   Bilateral Distance:    Right Eye Near:   Left Eye Near:    Bilateral Near:     Physical Exam Vitals reviewed.  Constitutional:      General: She is not in acute distress.    Appearance: She is not ill-appearing, toxic-appearing or diaphoretic.  HENT:     Nose: Nose normal.     Mouth/Throat:     Mouth: Mucous membranes are moist.     Comments: There is minimal erythema of the posterior oropharynx.  There is no tonsillar hypertrophy or any swelling Eyes:     Extraocular Movements: Extraocular movements intact.     Conjunctiva/sclera: Conjunctivae normal.     Pupils: Pupils are equal, round, and reactive to light.  Cardiovascular:     Rate and Rhythm: Normal rate and regular rhythm.     Heart sounds: No murmur heard. Pulmonary:     Effort: Pulmonary effort is normal. No respiratory distress.     Breath sounds: Normal breath sounds. No stridor. No wheezing, rhonchi or rales.  Abdominal:     General: There is no distension.     Palpations: Abdomen is soft.     Tenderness: There is no abdominal tenderness. There is  no guarding.  Musculoskeletal:  Cervical back: Neck supple.  Lymphadenopathy:     Cervical: No cervical adenopathy.  Skin:    Capillary Refill: Capillary refill takes less than 2 seconds.     Coloration: Skin is not pale.  Neurological:     General: No focal deficit present.     Mental Status: She is alert and oriented to person, place, and time.  Psychiatric:        Behavior: Behavior normal.      UC Treatments / Results  Labs (all labs ordered are listed, but only abnormal results are displayed) Labs Reviewed  CULTURE, GROUP A STREP [facility])  POCT RAPID STREP A (OFFICE)    EKG   Radiology No results found.  Procedures Procedures (including critical care time)  Medications Ordered in UC Medications - No data to display  Initial Impression / Assessment and Plan / UC Course  I have reviewed the triage vital signs and the nursing notes.  Pertinent labs & imaging results that were available during my care of the patient were reviewed by me and considered in my medical decision making (see chart for details).        Rapid strep is done and is negative.  Throat culture sent, and we will notify her in treatment protocol if positive.  Strongly suspect she has a viral gastroenteritis going on.  Recommended Imodium as needed for the diarrhea. Final Clinical Impressions(s) / UC Diagnoses   Final diagnoses:  Gastroenteritis  Throat pain     Discharge Instructions      Your strep test is negative.  Culture of the throat will be sent, and staff will notify you if that is in turn positive.  I do think most likely have a viral infection causing your diarrhea and possibly your throat pain.  You can take Imodium as you needed for diarrhea.  Eat bland things and drink plenty of fluids     ED Prescriptions   None    PDMP not reviewed this encounter.   Zenia Resides, MD 12/06/22 1104

## 2022-12-06 NOTE — Discharge Instructions (Signed)
Your strep test is negative.  Culture of the throat will be sent, and staff will notify you if that is in turn positive.  I do think most likely have a viral infection causing your diarrhea and possibly your throat pain.  You can take Imodium as you needed for diarrhea.  Eat bland things and drink plenty of fluids

## 2022-12-06 NOTE — ED Triage Notes (Signed)
Pt reports 2 episodes of diarrhea this AM. Pt reports bridge partner has been sick. Pt reports she LOS HAS A SORE THROAT.

## 2022-12-07 LAB — CULTURE, GROUP A STREP (THRC)

## 2022-12-08 LAB — CULTURE, GROUP A STREP (THRC)

## 2022-12-09 LAB — CULTURE, GROUP A STREP (THRC)

## 2022-12-26 ENCOUNTER — Other Ambulatory Visit: Payer: Self-pay | Admitting: Cardiology

## 2022-12-26 DIAGNOSIS — I491 Atrial premature depolarization: Secondary | ICD-10-CM

## 2023-02-08 ENCOUNTER — Other Ambulatory Visit: Payer: Self-pay | Admitting: Cardiology

## 2023-02-08 DIAGNOSIS — E782 Mixed hyperlipidemia: Secondary | ICD-10-CM

## 2023-02-14 DIAGNOSIS — L570 Actinic keratosis: Secondary | ICD-10-CM | POA: Diagnosis not present

## 2023-02-14 DIAGNOSIS — D1801 Hemangioma of skin and subcutaneous tissue: Secondary | ICD-10-CM | POA: Diagnosis not present

## 2023-02-14 DIAGNOSIS — L814 Other melanin hyperpigmentation: Secondary | ICD-10-CM | POA: Diagnosis not present

## 2023-02-14 DIAGNOSIS — Z85828 Personal history of other malignant neoplasm of skin: Secondary | ICD-10-CM | POA: Diagnosis not present

## 2023-02-14 DIAGNOSIS — D2262 Melanocytic nevi of left upper limb, including shoulder: Secondary | ICD-10-CM | POA: Diagnosis not present

## 2023-02-14 DIAGNOSIS — D0439 Carcinoma in situ of skin of other parts of face: Secondary | ICD-10-CM | POA: Diagnosis not present

## 2023-02-14 DIAGNOSIS — L821 Other seborrheic keratosis: Secondary | ICD-10-CM | POA: Diagnosis not present

## 2023-02-14 DIAGNOSIS — D692 Other nonthrombocytopenic purpura: Secondary | ICD-10-CM | POA: Diagnosis not present

## 2023-03-20 ENCOUNTER — Encounter: Payer: Self-pay | Admitting: Cardiology

## 2023-03-28 DIAGNOSIS — R413 Other amnesia: Secondary | ICD-10-CM | POA: Diagnosis not present

## 2023-03-28 DIAGNOSIS — R4181 Age-related cognitive decline: Secondary | ICD-10-CM | POA: Diagnosis not present

## 2023-04-09 NOTE — Progress Notes (Signed)
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 Originally seen by Dr Everardo All 02/10/2019 after w/u for epigastric pain, CT abd noted RML tree-in -bud. Subsequent concern of R infrahilar mass. Empiric Augmentin caused GI distress.  Bronchoscopy, R thoracoscopy, RML lobectomy, lymph node sampling 09/29/19- Dr Cliffton Asters Path- Granulomatous inflammation, mononuclear cells, Aerobic/Anaerobic CX> rare Propionibacterium sp. AFB cultures not done. "Differential diagnosis for granulomatous inflammation primarily includes  infection and sarcoidosis.  AFB and GMS special stains are negative for  acid-fast bacilli and fungal organisms respectively. Immunohistochemical  stain for synaptophysin highlights the carcinoid tumorlet."   CT Abdomen 11/13/18-mild RML tree in bud opacity CT Chest 01/09/19- scattered focal areas of tree-in-bud opacities in lower lobes bilaterally. RML bronchocele. Posterior opacity in RML, 3mm RLL nodule: recommended follow-up Labs  2021- ACE 9, ANA Neg, ANCA Neg CT chest 10/27/19 PET 12/14/19-mild focal accentuated activity with maximum SUV 3.6 which could be postoperative 2. Mild tree-in-bud nodularity in the left lower lobe Quantiferon TB Gold 03/30/20- Neg ------------------------------------------------------------------------------- 04/09/22- 75 yoF never smoker followed for granulomatous inflammation/ abscess, addressed with RML lobectomy, neg for CA, neg cultures.Complicated by HTN, Pancreatitis, Gastritis, Hyperlipidemia, PAFib,  Bronchoscopy, R thoracoscopy, RML lobectomy, lymph node sampling 09/29/19- Dr Cliffton Asters Path- Granulomatous inflammation, mononuclear cells, Aerobic/Anaerobic CX> rare Propionibacterium sp. AFB cultures not done. Covid vax-2 Moderna Flu vax- had CT chest pending 25-Apr-2022 (Dr Waynard Edwards) Denies any cough, phlegm or night sweats. Plays 9-hole golf using cart. Dicussed her  vaccines. Bothered by R knee pain- arthritis.   04/11/23-  76 yoF never smoker followed for Granulomatous Inflammation/ Abscess, Lung Nodules addressed with RML lobectomy, neg for CA, neg cultures.Complicated by HTN, Pancreatitis, Gastritis, Hyperlipidemia, PAFib,  Bronchoscopy, R thoracoscopy, RML lobectomy, lymph node sampling 09/29/19- Dr Cliffton Asters Path- Granulomatous inflammation (2021), mononuclear cells, Aerobic/Anaerobic CX> rare Propionibacterium sp. AFB cultures not done Flu vax senior today -----Breathing has been good  She denies cough wheeze or unusual dyspnea and feels stable.  Last CT scan discussed, showing stable lung nodules likely benign. 2 sisters have died with cancers. CT chest 2022/04/25-  IMPRESSION: 1. Stable exam. No new or progressive findings. 2. Status post right middle lobectomy. 3. Stable bilateral pulmonary nodules measuring up to 9 mm. Given the interval stability for more than 2 years, these are considered most likely benign. 4.  Aortic Atherosclerosis (ICD10-I70.0).  ROS-see HPI   + = positive Constitutional:   + weight loss, night sweats, fevers, chills, fatigue, lassitude. HEENT:    headaches, difficulty swallowing, tooth/dental problems, sore throat,       sneezing, itching, ear ache, nasal congestion, post nasal drip, snoring CV:   + post VATS chest pain, orthopnea, PND, swelling in lower extremities, anasarca,                                   dizziness, palpitations Resp:   shortness of breath with exertion or at rest.                productive cough,   +non-productive cough, coughing up of blood.              change in color of mucus.  wheezing.   Skin:    rash or lesions. GI:  No-   heartburn, indigestion, abdominal pain, nausea, vomiting, diarrhea,  change in bowel habits, loss of appetite GU: dysuria, change in color of urine, no urgency or frequency.   flank pain. MS:   +joint pain, stiffness, decreased range of motion, back  pain. Neuro-     nothing unusual Psych:  change in mood or affect.  depression or anxiety.   memory loss.  OBJ- Physical Exam General- Alert, Oriented, Affect-appropriate, Distress- none acute,  + slender Skin- rash-none, lesions- none, excoriation- none Lymphadenopathy- none Head- atraumatic            Eyes- Gross vision intact, PERRLA, conjunctivae and secretions clear            Ears- Hearing, canals-normal            Nose- Clear, no-Septal dev, mucus, polyps, erosion, perforation             Throat- Mallampati II , mucosa clear , drainage- none, tonsils- atrophic Neck- flexible , trachea midline, no stridor , thyroid nl, carotid no bruit Chest - symmetrical excursion , unlabored           Heart/CV- RRR , no murmur , no gallop  , no rub, nl s1 s2                           - JVD- none , edema- none, stasis changes- none, varices- none           Lung- clear to P&A, wheeze- none, cough- none , dullness-none, rub- none           Chest wall-  Abd-  Br/ Gen/ Rectal- Not done, not indicated Extrem- cyanosis- none, clubbing, none, atrophy- none, strength- nl Neuro- grossly intact to observation

## 2023-04-11 ENCOUNTER — Ambulatory Visit: Payer: Medicare HMO

## 2023-04-11 ENCOUNTER — Encounter: Payer: Self-pay | Admitting: Internal Medicine

## 2023-04-11 ENCOUNTER — Ambulatory Visit: Payer: Medicare HMO | Admitting: Internal Medicine

## 2023-04-11 VITALS — BP 134/78 | HR 57 | Ht 63.0 in | Wt 120.0 lb

## 2023-04-11 DIAGNOSIS — J852 Abscess of lung without pneumonia: Secondary | ICD-10-CM | POA: Diagnosis not present

## 2023-04-11 DIAGNOSIS — R918 Other nonspecific abnormal finding of lung field: Secondary | ICD-10-CM | POA: Diagnosis not present

## 2023-04-11 DIAGNOSIS — Z23 Encounter for immunization: Secondary | ICD-10-CM

## 2023-04-11 NOTE — Patient Instructions (Signed)
Order-flu vax senior  Order- CXR   hx Right middle lobectomy for granulomatous inflammation Please cal if we can help

## 2023-04-18 ENCOUNTER — Ambulatory Visit: Payer: Self-pay | Admitting: Cardiology

## 2023-04-24 ENCOUNTER — Other Ambulatory Visit: Payer: Self-pay

## 2023-04-24 ENCOUNTER — Emergency Department (HOSPITAL_COMMUNITY): Payer: Medicare HMO

## 2023-04-24 ENCOUNTER — Encounter (HOSPITAL_COMMUNITY): Payer: Self-pay | Admitting: Emergency Medicine

## 2023-04-24 ENCOUNTER — Emergency Department (HOSPITAL_COMMUNITY)
Admission: EM | Admit: 2023-04-24 | Discharge: 2023-04-24 | Disposition: A | Discharge status: Home / Self Care | Payer: Medicare HMO | Attending: Emergency Medicine | Admitting: Emergency Medicine

## 2023-04-24 DIAGNOSIS — M1712 Unilateral primary osteoarthritis, left knee: Secondary | ICD-10-CM | POA: Diagnosis not present

## 2023-04-24 DIAGNOSIS — S199XXA Unspecified injury of neck, initial encounter: Secondary | ICD-10-CM | POA: Diagnosis present

## 2023-04-24 DIAGNOSIS — S12100A Unspecified displaced fracture of second cervical vertebra, initial encounter for closed fracture: Secondary | ICD-10-CM | POA: Insufficient documentation

## 2023-04-24 DIAGNOSIS — M25462 Effusion, left knee: Secondary | ICD-10-CM | POA: Diagnosis not present

## 2023-04-24 DIAGNOSIS — M85862 Other specified disorders of bone density and structure, left lower leg: Secondary | ICD-10-CM | POA: Diagnosis not present

## 2023-04-24 DIAGNOSIS — W010XXA Fall on same level from slipping, tripping and stumbling without subsequent striking against object, initial encounter: Secondary | ICD-10-CM | POA: Diagnosis not present

## 2023-04-24 DIAGNOSIS — Z79899 Other long term (current) drug therapy: Secondary | ICD-10-CM | POA: Insufficient documentation

## 2023-04-24 DIAGNOSIS — S0101XA Laceration without foreign body of scalp, initial encounter: Secondary | ICD-10-CM | POA: Diagnosis not present

## 2023-04-24 DIAGNOSIS — M50222 Other cervical disc displacement at C5-C6 level: Secondary | ICD-10-CM | POA: Diagnosis not present

## 2023-04-24 DIAGNOSIS — S0990XA Unspecified injury of head, initial encounter: Secondary | ICD-10-CM | POA: Diagnosis not present

## 2023-04-24 DIAGNOSIS — W19XXXA Unspecified fall, initial encounter: Secondary | ICD-10-CM

## 2023-04-24 DIAGNOSIS — S12110A Anterior displaced Type II dens fracture, initial encounter for closed fracture: Secondary | ICD-10-CM | POA: Diagnosis not present

## 2023-04-24 DIAGNOSIS — S0083XA Contusion of other part of head, initial encounter: Secondary | ICD-10-CM

## 2023-04-24 DIAGNOSIS — M25562 Pain in left knee: Secondary | ICD-10-CM | POA: Diagnosis not present

## 2023-04-24 DIAGNOSIS — Z743 Need for continuous supervision: Secondary | ICD-10-CM | POA: Diagnosis not present

## 2023-04-24 MED ORDER — LIDOCAINE-EPINEPHRINE-TETRACAINE (LET) TOPICAL GEL
3.0000 mL | Freq: Once | TOPICAL | Status: AC
  Administered 2023-04-24: 3 mL via TOPICAL
  Filled 2023-04-24: qty 3

## 2023-04-24 NOTE — ED Triage Notes (Signed)
Pt BIB [facility] from home due to fall forward.  [facility] placed C-collar on patient.  Pt tripped and fell forward on concrete.  Pt reports she did not lose consciousness and does not take blood thinners.  EMS gave 650  Tylenol en route.  VSS.

## 2023-04-24 NOTE — ED Notes (Signed)
[place] collar placed on patient.

## 2023-04-24 NOTE — Discharge Instructions (Addendum)
Please call make an appointment with neurosurgery for follow-up in the next 1 to 3 weeks for dens fracture of the cervical (neck) spine.  Please follow-up with your primary care physician or any healthcare professional in the next 7 to 10 days for suture removal.  Take Tylenol 650 mg every 8 hours as needed for headache or neck pain after fall.

## 2023-04-24 NOTE — ED Notes (Signed)
Suture tray at bedside.  

## 2023-04-24 NOTE — ED Notes (Signed)
Patient transported to CT 

## 2023-04-24 NOTE — ED Notes (Signed)
Patient transported to X-ray 

## 2023-04-24 NOTE — ED Notes (Addendum)
Pt has abrasion to nose and two puncture wounds on forehead.

## 2023-04-24 NOTE — ED Provider Notes (Signed)
[place] EMERGENCY DEPARTMENT AT [facility] Provider Note   CSN: [id number] Arrival date & time: 04/24/23  1023     History  Chief Complaint  Patient presents with   Tina Erickson is a 77 y.o. female.  The patient is a 77 year old female presenting for fall.  Patient states she tripped on a step and fell forward.  She admits to laceration and bleeding on her forehead.  She denies any neck pain.  Admits to facial pain and headache.  Denies any back pain.  Denies any other bony tenderness of this time.  Patient does not take any blood thinners.  The history is provided by the patient. No language interpreter was used.  Fall Pertinent negatives include no chest pain, no abdominal pain and no shortness of breath.       Home Medications Prior to Admission medications   Medication Sig Start Date End Date Taking? Authorizing Provider  Apoaequorin (PREVAGEN PO) Take 1 tablet by mouth daily.     [provider]  Cholecalciferol (VITAMIN D) 50 MCG (2000 UT) tablet Take 1 tablet by mouth daily. 03/25/13   [provider]  co-enzyme Q-10 50 MG capsule Take 50 mg by mouth daily.    [provider]  esomeprazole (NEXIUM) 40 MG capsule Take 40 mg by mouth daily.  01/14/19   [provider]  Tina Erickson (OMEGA-3) 500 MG CAPS Take by mouth. 09/24/19   [provider]  metoprolol succinate (TOPROL-XL) 25 MG 24 hr tablet TAKE 1 TABLET BY MOUTH ONCE DAILY IMMEDIATELY  FOLLOWING  A  MEAL 12/26/22   Patwardhan, Manish J, MD  milk thistle 175 MG tablet Take 175 mg by mouth daily.     [provider]  Multiple Vitamins-Minerals (HAIR SKIN AND NAILS FORMULA PO) Take 1 Capful by mouth daily at 2 PM.    [provider]  rosuvastatin (CRESTOR) 20 MG tablet TAKE 1 TABLET BY MOUTH AT BEDTIME 02/08/23   Patwardhan, Manish J, MD  Saccharomyces boulardii (PROBIOTIC) 250 MG CAPS Take 1 capsule by mouth in the morning and at bedtime.  10/09/19   Glenford Bayley, NP  triamcinolone cream (KENALOG) 0.1 % Apply 1 application topically 2 (two) times daily. As needed    [provider]  valsartan (DIOVAN) 80 MG tablet TAKE 1 TABLET BY MOUTH AT BEDTIME 08/09/22   Patwardhan, Manish J, MD  vitamin E 180 MG (400 UNITS) capsule Take 400 Units by mouth daily.    [provider]      Allergies    Pravachol [pravastatin] and Prednisone    Review of Systems   Review of Systems  Constitutional:  Negative for chills and fever.  HENT:  Negative for ear pain and sore throat.   Eyes:  Negative for pain and visual disturbance.  Respiratory:  Negative for cough and shortness of breath.   Cardiovascular:  Negative for chest pain and palpitations.  Gastrointestinal:  Negative for abdominal pain and vomiting.  Genitourinary:  Negative for dysuria and hematuria.  Musculoskeletal:  Negative for arthralgias and back pain.  Skin:  Positive for wound. Negative for color change and rash.  Neurological:  Negative for seizures and syncope.  All other systems reviewed and are negative.   Physical Exam Updated Vital Signs BP 122/69   Pulse (!) 56   Temp 98 F (36.7 C)   Resp 16   Ht 5\' 3"  (1.6 m)   Wt 56.7 kg  SpO2 100%   BMI 22.14 kg/m  Physical Exam Vitals and nursing note reviewed.  Constitutional:      General: She is not in acute distress.    Appearance: She is well-developed.  HENT:     Head: Normocephalic and atraumatic.   Eyes:     General: Lids are normal. Vision grossly intact.     Conjunctiva/sclera: Conjunctivae normal.     Pupils: Pupils are equal, round, and reactive to light.  Cardiovascular:     Rate and Rhythm: Normal rate and regular rhythm.     Heart sounds: No murmur heard. Pulmonary:     Effort: Pulmonary effort is normal. No respiratory distress.     Breath sounds: Normal breath sounds.  Abdominal:     Palpations: Abdomen is soft.     Tenderness: There is no abdominal tenderness.   Musculoskeletal:        General: No swelling.     Cervical back: Neck supple.  Skin:    General: Skin is warm and dry.     Capillary Refill: Capillary refill takes less than 2 seconds.  Neurological:     Mental Status: She is alert and oriented to person, place, and time.     GCS: GCS eye subscore is 4. GCS verbal subscore is 5. GCS motor subscore is 6.     Cranial Nerves: Cranial nerves 2-12 are intact.     Sensory: Sensation is intact.     Motor: Motor function is intact.     Coordination: Coordination is intact.     Gait: Gait is intact.  Psychiatric:        Mood and Affect: Mood normal.     ED Results / Procedures / Treatments   Labs (all labs ordered are listed, but only abnormal results are displayed) Labs Reviewed - No data to display  EKG None  Radiology CT Head Wo Contrast  Result Date: 04/24/2023 CLINICAL DATA:  Head trauma, minor (Age >= 65y); Neck trauma (Age >= 65y) EXAM: CT HEAD WITHOUT CONTRAST CT CERVICAL SPINE WITHOUT CONTRAST TECHNIQUE: Multidetector CT imaging of the head and cervical spine was performed following the standard protocol without intravenous contrast. Multiplanar CT image reconstructions of the cervical spine were also generated. RADIATION DOSE REDUCTION: This exam was performed according to the departmental dose-optimization program which includes automated exposure control, adjustment of the mA and/or kV according to patient size and/or use of iterative reconstruction technique. COMPARISON:  None Available. FINDINGS: CT HEAD FINDINGS Brain: No hemorrhage. No hydrocephalus. No extra-axial fluid collection. No CT evidence of an acute cortical infarct. No mass effect. No mass lesion. Vascular: No hyperdense vessel or unexpected calcification. Skull: Normal. Negative for fracture or focal lesion. Sinuses/Orbits: no Middle ear or mastoid effusion. Mild pansinus mucosal thickening. Bilateral lens replacement. Orbits are otherwise unremarkable Other: None.  CT CERVICAL SPINE FINDINGS Alignment: Trace anterolisthesis of C3 on C4 and C4 on C5. Trace retrolisthesis of C5 on C6. Skull base and vertebrae: There is an acute nondisplaced type 2 dens fracture Soft tissues and spinal canal: No prevertebral fluid or swelling. No visible canal hematoma. Disc levels:  No evidence of high-grade spinal canal stenosis Upper chest: Negative Other: None IMPRESSION: 1. No CT evidence of intracranial injury. 2. Acute nondisplaced type 2 dens fracture. Electronically Signed   By: Lorenza Cambridge M.D.   On: 04/24/2023 13:43   CT Cervical Spine Wo Contrast  Result Date: 04/24/2023 CLINICAL DATA:  Head trauma, minor (Age >= 65y); Neck trauma (Age >=  65y) EXAM: CT HEAD WITHOUT CONTRAST CT CERVICAL SPINE WITHOUT CONTRAST TECHNIQUE: Multidetector CT imaging of the head and cervical spine was performed following the standard protocol without intravenous contrast. Multiplanar CT image reconstructions of the cervical spine were also generated. RADIATION DOSE REDUCTION: This exam was performed according to the departmental dose-optimization program which includes automated exposure control, adjustment of the mA and/or kV according to patient size and/or use of iterative reconstruction technique. COMPARISON:  None Available. FINDINGS: CT HEAD FINDINGS Brain: No hemorrhage. No hydrocephalus. No extra-axial fluid collection. No CT evidence of an acute cortical infarct. No mass effect. No mass lesion. Vascular: No hyperdense vessel or unexpected calcification. Skull: Normal. Negative for fracture or focal lesion. Sinuses/Orbits: no Middle ear or mastoid effusion. Mild pansinus mucosal thickening. Bilateral lens replacement. Orbits are otherwise unremarkable Other: None. CT CERVICAL SPINE FINDINGS Alignment: Trace anterolisthesis of C3 on C4 and C4 on C5. Trace retrolisthesis of C5 on C6. Skull base and vertebrae: There is an acute nondisplaced type 2 dens fracture Soft tissues and spinal canal: No  prevertebral fluid or swelling. No visible canal hematoma. Disc levels:  No evidence of high-grade spinal canal stenosis Upper chest: Negative Other: None IMPRESSION: 1. No CT evidence of intracranial injury. 2. Acute nondisplaced type 2 dens fracture. Electronically Signed   By: Lorenza Cambridge M.D.   On: 04/24/2023 13:43    Procedures .Critical Care  Performed by: Franne Forts, DO Authorized by: Franne Forts, DO   Critical care provider statement:    Critical care time (minutes):  30   Critical care was necessary to treat or prevent imminent or life-threatening deterioration of the following conditions:  Trauma   Critical care was time spent personally by me on the following activities:  Development of treatment plan with patient or surrogate, discussions with consultants, evaluation of patient's response to treatment, examination of patient, ordering and review of laboratory studies, ordering and review of radiographic studies, ordering and performing treatments and interventions, pulse oximetry, re-evaluation of patient's condition and review of old charts   Care discussed with comment:  Neurosurgery .Marland KitchenLaceration Repair  Date/Time: 04/24/2023 3:22 PM  Performed by: Franne Forts, DO Authorized by: Franne Forts, DO   Consent:    Consent obtained:  Verbal   Consent given by:  Patient   Risks discussed:  Infection, pain, need for additional repair and poor cosmetic result   Alternatives discussed:  No treatment Universal protocol:    Immediately prior to procedure, a time out was called: no     Patient identity confirmed:  Verbally with patient, arm band and provided demographic data Laceration details:    Location:  Scalp   Length (cm):  1 Skin repair:    Repair method:  Sutures   Suture size:  5-0   Number of sutures:  2 Approximation:    Approximation:  Close Repair type:    Repair type:  Simple Post-procedure details:    Procedure completion:  Tolerated well, no  immediate complications .Marland KitchenLaceration Repair  Date/Time: 04/24/2023 3:23 PM  Performed by: Franne Forts, DO Authorized by: Franne Forts, DO   Consent:    Consent obtained:  Verbal   Consent given by:  Patient   Risks discussed:  Infection, need for additional repair and nerve damage   Alternatives discussed:  No treatment Universal protocol:    Immediately prior to procedure, a time out was called: no     Patient identity confirmed:  Verbally with patient,  provided demographic data and arm band Laceration details:    Location:  Scalp   Scalp location: gabella.   Length (cm):  1 Treatment:    Area cleansed with:  Saline   Amount of cleaning:  Standard Skin repair:    Repair method:  Sutures   Suture size:  5-0   Number of sutures:  2 Approximation:    Approximation:  Close Repair type:    Repair type:  Simple Post-procedure details:    Procedure completion:  Tolerated well, no immediate complications     Medications Ordered in ED Medications  lidocaine-EPINEPHrine-tetracaine (LET) topical gel (3 mLs Topical Given 04/24/23 1434)    ED Course/ Medical Decision Making/ A&P                                 Medical Decision Making Amount and/or Complexity of Data Reviewed Radiology: ordered.   31:67 PM  77 year old female presenting for mechanical fall with 2 lacerations above Kybella region.  Patient is alert oriented x 3, no acute distress, afebrile, stable vital signs.  Physical exam demonstrates 2 lacerations on the glabella.  Each proximately 1 cm.  No foreign bodies.  Minimal bleeding.  Wounds irrigated with normal saline and closed with 2 sutures.  No C-spine, thoracic, or lumbar tenderness.  No bony tenderness.  Patient neurovascularly intact.  CT head stable. CT neck demonstrates dens type II fracture.  Consult to neurosurgery.  Safe for discharge home as long as there is no neurovascular deficits with c-collar.  Patient up-to-date and agreeable to plan.   Will follow-up outpatient.  Medication given for pain control.  Recommend follow-up with PCP for suture removal in the neck 7 to 10 days.  Education on wound care provided.  Patient remains neurovascularly intact.  Patient in no distress and overall condition improved here in the ED. Detailed discussions were had with the patient regarding current findings, and need for close f/u with PCP or on call doctor. The patient has been instructed to return immediately if the symptoms worsen in any way for re-evaluation. Patient verbalized understanding and is in agreement with current care plan. All questions answered prior to discharge.              Final Clinical Impression(s) / ED Diagnoses Final diagnoses:  Closed odontoid fracture, initial encounter [facility])  Fall, initial encounter  Contusion of face, initial encounter  Laceration of scalp, initial encounter    Rx / DC Orders ED Discharge Orders     None         Franne Forts, DO 04/24/23 1528

## 2023-04-29 DIAGNOSIS — M4120 Other idiopathic scoliosis, site unspecified: Secondary | ICD-10-CM | POA: Diagnosis not present

## 2023-04-29 DIAGNOSIS — S129XXS Fracture of neck, unspecified, sequela: Secondary | ICD-10-CM | POA: Diagnosis not present

## 2023-04-29 DIAGNOSIS — Z1331 Encounter for screening for depression: Secondary | ICD-10-CM | POA: Diagnosis not present

## 2023-04-29 DIAGNOSIS — R413 Other amnesia: Secondary | ICD-10-CM | POA: Diagnosis not present

## 2023-04-29 DIAGNOSIS — R4181 Age-related cognitive decline: Secondary | ICD-10-CM | POA: Diagnosis not present

## 2023-04-29 DIAGNOSIS — Z1339 Encounter for screening examination for other mental health and behavioral disorders: Secondary | ICD-10-CM | POA: Diagnosis not present

## 2023-04-29 DIAGNOSIS — I10 Essential (primary) hypertension: Secondary | ICD-10-CM | POA: Diagnosis not present

## 2023-04-29 DIAGNOSIS — R82998 Other abnormal findings in urine: Secondary | ICD-10-CM | POA: Diagnosis not present

## 2023-04-29 DIAGNOSIS — E785 Hyperlipidemia, unspecified: Secondary | ICD-10-CM | POA: Diagnosis not present

## 2023-04-29 DIAGNOSIS — Z0000 Encounter for general adult medical examination without abnormal findings: Secondary | ICD-10-CM | POA: Diagnosis not present

## 2023-05-01 ENCOUNTER — Encounter: Payer: Self-pay | Admitting: Internal Medicine

## 2023-05-01 DIAGNOSIS — R918 Other nonspecific abnormal finding of lung field: Secondary | ICD-10-CM | POA: Insufficient documentation

## 2023-05-01 NOTE — Assessment & Plan Note (Signed)
Probably same granulomatous inflammation process as associated with her right middle lobectomy in 2021. Plan-follow-up with occasional chest x-ray as needed.

## 2023-05-01 NOTE — Assessment & Plan Note (Signed)
ACE and ANCA were normal in 2021 with stable chest CT.  She feels very well. Plan-conservative follow-up.

## 2023-05-03 DIAGNOSIS — S12112A Nondisplaced Type II dens fracture, initial encounter for closed fracture: Secondary | ICD-10-CM | POA: Diagnosis not present

## 2023-05-07 ENCOUNTER — Telehealth: Payer: Self-pay | Admitting: Internal Medicine

## 2023-05-07 ENCOUNTER — Other Ambulatory Visit: Payer: Self-pay

## 2023-05-07 DIAGNOSIS — R918 Other nonspecific abnormal finding of lung field: Secondary | ICD-10-CM

## 2023-05-07 NOTE — Telephone Encounter (Signed)
Tiffany calling with call report. For xray. Tiffany phone number is [phone].

## 2023-05-07 NOTE — Telephone Encounter (Signed)
Called [facility] and spoke with Gabe.  Advised I would send the results to Dr. Maple Hudson for follow up.  IMPRESSION:  2. Interval development of 1.3 cm nodular opacity at the left infrahilar lung. Further evaluation with contrast-enhanced chest CT is recommended.  Dr. Maple Hudson, Please advise.  Thank you.

## 2023-05-07 NOTE — Progress Notes (Signed)
Order has been placed.

## 2023-05-07 NOTE — Telephone Encounter (Signed)
Current chest CT shows new L lung nodule. I called Tina Erickson and discussed. I will order CT chest with contrast, to be done in 4-6 months, getting past Thanksgiing and giving a little time to see if growth is evident by then.  Order- CT chest with contrast    dx lung nodule to be done in 4-6 weeks

## 2023-05-13 ENCOUNTER — Other Ambulatory Visit: Payer: Self-pay | Admitting: Cardiology

## 2023-05-13 DIAGNOSIS — E782 Mixed hyperlipidemia: Secondary | ICD-10-CM

## 2023-05-20 ENCOUNTER — Ambulatory Visit: Payer: Medicare HMO | Attending: Cardiology | Admitting: Cardiology

## 2023-05-20 ENCOUNTER — Encounter: Payer: Self-pay | Admitting: Cardiology

## 2023-05-20 VITALS — BP 112/70 | HR 65 | Ht 63.5 in | Wt 116.0 lb

## 2023-05-20 DIAGNOSIS — Z01810 Encounter for preprocedural cardiovascular examination: Secondary | ICD-10-CM | POA: Insufficient documentation

## 2023-05-20 DIAGNOSIS — I10 Essential (primary) hypertension: Secondary | ICD-10-CM

## 2023-05-20 DIAGNOSIS — I491 Atrial premature depolarization: Secondary | ICD-10-CM | POA: Diagnosis not present

## 2023-05-20 DIAGNOSIS — E782 Mixed hyperlipidemia: Secondary | ICD-10-CM | POA: Diagnosis not present

## 2023-05-20 MED ORDER — METOPROLOL SUCCINATE ER 25 MG PO TB24
25.0000 mg | ORAL_TABLET | Freq: Every day | ORAL | 3 refills | Status: DC
Start: 2023-05-20 — End: 2024-04-16

## 2023-05-20 MED ORDER — ROSUVASTATIN CALCIUM 20 MG PO TABS
20.0000 mg | ORAL_TABLET | Freq: Every day | ORAL | 3 refills | Status: AC
Start: 2023-05-20 — End: ?

## 2023-05-20 MED ORDER — VALSARTAN 80 MG PO TABS
80.0000 mg | ORAL_TABLET | Freq: Every day | ORAL | 3 refills | Status: DC
Start: 2023-05-20 — End: 2024-05-13

## 2023-05-20 NOTE — Patient Instructions (Signed)
Medication Instructions:   Your physician recommends that you continue on your current medications as directed. Please refer to the Current Medication list given to you today.  *If you need a refill on your cardiac medications before your next appointment, please call your pharmacy*    Follow-Up: At [facility], you and your health needs are our priority.  As part of our continuing mission to provide you with exceptional heart care, we have created designated Provider Care Teams.  These Care Teams include your primary Cardiologist (physician) and Advanced Practice Providers (APPs -  Physician Assistants and Nurse Practitioners) who all work together to provide you with the care you need, when you need it.  We recommend signing up for the patient portal called "MyChart".  Sign up information is provided on this After Visit Summary.  MyChart is used to connect with patients for Virtual Visits (Telemedicine).  Patients are able to view lab/test results, encounter notes, upcoming appointments, etc.  Non-urgent messages can be sent to your provider as well.   To learn more about what you can do with MyChart, go to [email or web address].    Your next appointment:   1 year(s)  Provider:   Dr. Rosemary Holms

## 2023-05-20 NOTE — Progress Notes (Addendum)
Cardiology Office Note:  .   Date:  05/20/2023  ID:  Tina Erickson, DOB 24-May-1946, MRN [id number] PCP: Rodrigo Ran, MD  [facility] Providers Cardiologist:  Truett Mainland, MD PCP: Rodrigo Ran, MD  Chief Complaint  Patient presents with   Hyperlipidemia      History of Present Illness: .    Tina Erickson is a 77 y.o. female with hypertension, hyperlipidemia, paroxysmal atrial tachcyardia, h/o pancreatitis, family h/o CAD   Few days ago, patient had a mechanical fall and fractured her cervical vertebrae, as well as some superficial injuries.  She is going to follow-up with neurosurgeon to see if she needs surgery for the cervical neck fracture, is currently wearing a neck collar.  She denies any cardiac complaints.  Denies any chest pain, shortness of breath, palpitation symptoms.  Up until this accident, she was walking regularly without any complaints.  In addition to potential for cervical spine surgery, she may also have any surgery in the near future.  Vitals:   05/20/23 1058  BP: 112/70  Pulse: 65  SpO2: 97%     ROS:  Review of Systems  Cardiovascular:  Negative for chest pain, dyspnea on exertion, leg swelling, palpitations and syncope.  Musculoskeletal:  Positive for joint pain.       Neck injury      Studies Reviewed: Marland Kitchen        EKG 05/20/2023: Normal sinus rhythm Normal ECG When compared with ECG of 02-Oct-2019 15:17, Premature supraventricular complexes are no longer Present Nonspecific T wave abnormality no longer evident in Inferior leads Nonspecific T wave abnormality no longer evident in Anterior leads    Independently interpreted Labs 06/2022: Chol 172, TG 121, HDL 74, LDL 77  Mobile cardiac telemetry 13 days 08/12/2020 - 08/26/2020: Dominant rhythm: Sinus. HR 49-143 bpm. Avg HR 76 bpm, while in sinus rhythm >2000 episodes of SVT/atrial tachcyardia, fastest at 169 bpm for 4 beats, longest for 18 beats at 104 bpm. 9.3% isolated SVE, 8.2%  couplets, 7.8% triplets. <1% isolated VE, couplet No atrial fibrillation/atrial flutter/VT/high grade AV block, sinus pause >3sec noted. 0 patient triggered events.        Physical Exam:   Physical Exam Vitals and nursing note reviewed.  Constitutional:      General: She is not in acute distress. Neck:     Vascular: No JVD.     Comments: Cervical collar  Cardiovascular:     Rate and Rhythm: Normal rate and regular rhythm.     Heart sounds: Normal heart sounds. No murmur heard. Pulmonary:     Effort: Pulmonary effort is normal.     Breath sounds: Normal breath sounds. No wheezing or rales.      VISIT DIAGNOSES:   ICD-10-CM   1. Premature atrial contraction  I49.1 EKG 12-Lead    metoprolol succinate (TOPROL-XL) 25 MG 24 hr tablet    2. Essential hypertension  I10 valsartan (DIOVAN) 80 MG tablet    3. Mixed hyperlipidemia  E78.2 rosuvastatin (CRESTOR) 20 MG tablet       ASSESSMENT AND PLAN: .    Tina Erickson is a 77 y.o. female with hypertension, hyperlipidemia, paroxysmal atrial tachcyardia, h/o pancreatitis, family h/o CAD   PAC/PAT: Well-controlled with metoprolol.  Hyperlipidemia: Well-controlled, continue statin  Preop cardiac risk stratification: Low cardiac risk for upcoming surgeries, if needed, including but not limited to cervical spine surgery, knee replacement surgery  Continue f/u for lung nodule with pulmonology.  Meds ordered this encounter  Medications   valsartan (DIOVAN) 80 MG tablet    Sig: Take 1 tablet (80 mg total) by mouth at bedtime.    Dispense:  90 tablet    Refill:  3   rosuvastatin (CRESTOR) 20 MG tablet    Sig: Take 1 tablet (20 mg total) by mouth at bedtime.    Dispense:  90 tablet    Refill:  3   metoprolol succinate (TOPROL-XL) 25 MG 24 hr tablet    Sig: Take 1 tablet (25 mg total) by mouth daily.    Dispense:  90 tablet    Refill:  3     F/u in 1 year  Signed, Elder Negus, MD

## 2023-05-30 DIAGNOSIS — M25562 Pain in left knee: Secondary | ICD-10-CM | POA: Insufficient documentation

## 2023-05-31 DIAGNOSIS — S12112A Nondisplaced Type II dens fracture, initial encounter for closed fracture: Secondary | ICD-10-CM | POA: Diagnosis not present

## 2023-07-15 DIAGNOSIS — S12112A Nondisplaced Type II dens fracture, initial encounter for closed fracture: Secondary | ICD-10-CM | POA: Diagnosis not present

## 2023-07-15 DIAGNOSIS — S12112D Nondisplaced Type II dens fracture, subsequent encounter for fracture with routine healing: Secondary | ICD-10-CM | POA: Diagnosis not present

## 2023-07-23 DIAGNOSIS — S46011D Strain of muscle(s) and tendon(s) of the rotator cuff of right shoulder, subsequent encounter: Secondary | ICD-10-CM | POA: Diagnosis not present

## 2023-07-23 DIAGNOSIS — S12101D Unspecified nondisplaced fracture of second cervical vertebra, subsequent encounter for fracture with routine healing: Secondary | ICD-10-CM | POA: Diagnosis not present

## 2023-07-29 DIAGNOSIS — S12101D Unspecified nondisplaced fracture of second cervical vertebra, subsequent encounter for fracture with routine healing: Secondary | ICD-10-CM | POA: Diagnosis not present

## 2023-07-29 DIAGNOSIS — S46011D Strain of muscle(s) and tendon(s) of the rotator cuff of right shoulder, subsequent encounter: Secondary | ICD-10-CM | POA: Diagnosis not present

## 2023-08-05 DIAGNOSIS — S46011D Strain of muscle(s) and tendon(s) of the rotator cuff of right shoulder, subsequent encounter: Secondary | ICD-10-CM | POA: Diagnosis not present

## 2023-08-05 DIAGNOSIS — S12101D Unspecified nondisplaced fracture of second cervical vertebra, subsequent encounter for fracture with routine healing: Secondary | ICD-10-CM | POA: Diagnosis not present

## 2023-08-12 DIAGNOSIS — S46011D Strain of muscle(s) and tendon(s) of the rotator cuff of right shoulder, subsequent encounter: Secondary | ICD-10-CM | POA: Diagnosis not present

## 2023-08-12 DIAGNOSIS — S12101D Unspecified nondisplaced fracture of second cervical vertebra, subsequent encounter for fracture with routine healing: Secondary | ICD-10-CM | POA: Diagnosis not present

## 2023-08-19 DIAGNOSIS — S46011D Strain of muscle(s) and tendon(s) of the rotator cuff of right shoulder, subsequent encounter: Secondary | ICD-10-CM | POA: Diagnosis not present

## 2023-08-19 DIAGNOSIS — S12101D Unspecified nondisplaced fracture of second cervical vertebra, subsequent encounter for fracture with routine healing: Secondary | ICD-10-CM | POA: Diagnosis not present

## 2023-08-26 DIAGNOSIS — S46011D Strain of muscle(s) and tendon(s) of the rotator cuff of right shoulder, subsequent encounter: Secondary | ICD-10-CM | POA: Diagnosis not present

## 2023-08-26 DIAGNOSIS — S12101D Unspecified nondisplaced fracture of second cervical vertebra, subsequent encounter for fracture with routine healing: Secondary | ICD-10-CM | POA: Diagnosis not present

## 2023-09-02 DIAGNOSIS — S12101D Unspecified nondisplaced fracture of second cervical vertebra, subsequent encounter for fracture with routine healing: Secondary | ICD-10-CM | POA: Diagnosis not present

## 2023-09-02 DIAGNOSIS — S46011D Strain of muscle(s) and tendon(s) of the rotator cuff of right shoulder, subsequent encounter: Secondary | ICD-10-CM | POA: Diagnosis not present

## 2023-09-09 DIAGNOSIS — S12101D Unspecified nondisplaced fracture of second cervical vertebra, subsequent encounter for fracture with routine healing: Secondary | ICD-10-CM | POA: Diagnosis not present

## 2023-09-09 DIAGNOSIS — S46011D Strain of muscle(s) and tendon(s) of the rotator cuff of right shoulder, subsequent encounter: Secondary | ICD-10-CM | POA: Diagnosis not present

## 2023-09-12 DIAGNOSIS — H0012 Chalazion right lower eyelid: Secondary | ICD-10-CM | POA: Diagnosis not present

## 2023-09-12 DIAGNOSIS — Z961 Presence of intraocular lens: Secondary | ICD-10-CM | POA: Diagnosis not present

## 2023-09-12 DIAGNOSIS — H26493 Other secondary cataract, bilateral: Secondary | ICD-10-CM | POA: Diagnosis not present

## 2023-10-22 ENCOUNTER — Ambulatory Visit: Admitting: Cardiology

## 2023-11-01 ENCOUNTER — Ambulatory Visit: Attending: Cardiology | Admitting: Cardiology

## 2023-11-01 ENCOUNTER — Encounter: Payer: Self-pay | Admitting: Cardiology

## 2023-11-01 VITALS — BP 121/72 | HR 68 | Resp 16 | Ht 63.0 in | Wt 118.2 lb

## 2023-11-01 DIAGNOSIS — I10 Essential (primary) hypertension: Secondary | ICD-10-CM | POA: Diagnosis not present

## 2023-11-01 DIAGNOSIS — R072 Precordial pain: Secondary | ICD-10-CM

## 2023-11-01 DIAGNOSIS — E782 Mixed hyperlipidemia: Secondary | ICD-10-CM | POA: Diagnosis not present

## 2023-11-01 MED ORDER — METOPROLOL TARTRATE 100 MG PO TABS: 100.0000 mg | ORAL_TABLET | Freq: Once | ORAL | 0 refills | Status: AC

## 2023-11-01 NOTE — Patient Instructions (Addendum)
 Medication Instructions:  Your physician recommends that you continue on your current medications as directed. Please refer to the Current Medication list given to you today.  *If you need a refill on your cardiac medications before your next appointment, please call your pharmacy*  Lab Work: In 2 weeks (lab on 1st floor, no appointment needed): BMET If you have labs (blood work) drawn today and your tests are completely normal, you will receive your results only by: MyChart Message (if you have MyChart) OR A paper copy in the mail If you have any lab test that is abnormal or we need to change your treatment, we will call you to review the results.  Testing/Procedures: Echo Your physician has requested that you have an echocardiogram. Echocardiography is a painless test that uses sound waves to create images of your heart. It provides your doctor with information about the size and shape of your heart and how well your heart's chambers and valves are working. This procedure takes approximately one hour. There are no restrictions for this procedure. Please do NOT wear cologne, perfume, aftershave, or lotions (deodorant is allowed). Please arrive 15 minutes prior to your appointment time.  Please note: We ask at that you not bring children with you during ultrasound (echo/ vascular) testing. Due to room size and safety concerns, children are not allowed in the ultrasound rooms during exams. Our front office staff cannot provide observation of children in our lobby area while testing is being conducted. An adult accompanying a patient to their appointment will only be allowed in the ultrasound room at the discretion of the ultrasound technician under special circumstances. We apologize for any inconvenience.  Cardiac CTA Your physician has requested that you have cardiac CT. Cardiac computed tomography (CT) is a painless test that uses an x-ray machine to take clear, detailed pictures of your  heart. For further information please visit [email or web address] Please follow instruction sheet as given.  Follow-Up: At [facility], you and your health needs are our priority.  As part of our continuing mission to provide you with exceptional heart care, our providers are all part of one team.  This team includes your primary Cardiologist (physician) and Advanced Practice Providers or APPs (Physician Assistants and Nurse Practitioners) who all work together to provide you with the care you need, when you need it.  Your next appointment:   6 month(s)  Provider:   Cody Das, MD    We recommend signing up for the patient portal called "MyChart".  Sign up information is provided on this After Visit Summary.  MyChart is used to connect with patients for Virtual Visits (Telemedicine).  Patients are able to view lab/test results, encounter notes, upcoming appointments, etc.  Non-urgent messages can be sent to your provider as well.   To learn more about what you can do with MyChart, go to [email or web address].   Other Instructions   Your cardiac CT will be scheduled at one of the below locations:   [facility] [address] [place], [place] [address] [phone]  OR  [facility] [address]  [place], [place] [address] Opening October 21, 2023  If scheduled at [facility], please arrive at the [facility] and Children's Entrance (Entrance C2) of [facility] 30 minutes prior to test start time. You can use the FREE valet parking offered at entrance C (encouraged to control the heart rate for the test)  Proceed to the [facility]  Radiology Department (first floor) to check-in and test prep.   All radiology patients and guests should use entrance C2 at [facility], accessed from [facility], even though the hospital's physical address listed is [address].    If scheduled at  the Heart and Vascular Tower at [address], please enter the parking lot using the [address] entrance and use the FREE valet service at the patient drop-off area. Enter the buidling and check-in with registration on the main floor.  Please follow these instructions carefully (unless otherwise directed):  An IV will be required for this test and Nitroglycerin will be given.  Hold all erectile dysfunction medications at least 3 days (72 hrs) prior to test. (Ie viagra, cialis, sildenafil, tadalafil, etc)   On the Night Before the Test: Be sure to Drink plenty of water. Do not consume any caffeinated/decaffeinated beverages or chocolate 12 hours prior to your test. Do not take any antihistamines 12 hours prior to your test.  On the Day of the Test: Drink plenty of water until 1 hour prior to the test. Do not eat any food 1 hour prior to test. You may take your regular medications prior to the test.  On the morning of your CT do not take metoprolol  succinate (Toprol  XL), you will take metoprolol  (Lopressor ) 100 mg two hours prior to test. Patients who wear a continuous glucose monitor MUST remove the device prior to scanning. FEMALES- please wear underwire-free bra if available, avoid dresses & tight clothing      After the Test: Drink plenty of water. After receiving IV contrast, you may experience a mild flushed feeling. This is normal. On occasion, you may experience a mild rash up to 24 hours after the test. This is not dangerous. If this occurs, you can take Benadryl  25 mg, Zyrtec, Claritin, or Allegra and increase your fluid intake. (Patients taking Tikosyn should avoid Benadryl , and may take Zyrtec, Claritin, or Allegra) If you experience trouble breathing, this can be serious. If it is severe call 911 IMMEDIATELY. If it is mild, please call our office.  We will call to schedule your test 2-4 weeks out understanding that some insurance companies will need an authorization prior  to the service being performed.   For more information and frequently asked questions, please visit our website : [email or web address]  For non-scheduling related questions, please contact the cardiac imaging nurse navigator should you have any questions/concerns: Cardiac Imaging Nurse Navigators Direct Office Dial: [phone]   For scheduling needs, including cancellations and rescheduling, please call [place], [phone].

## 2023-11-01 NOTE — Progress Notes (Signed)
  Cardiology Office Note:  .   Date:  11/01/2023  ID:  Tina Erickson, DOB 10/31/1945, MRN [id number] PCP: Aldo Hun, MD  [facility] Providers Cardiologist:  Fransico Ivy, MD PCP: Aldo Hun, MD  Chief Complaint  Patient presents with   Essential hypertension   Premature atrial contraction   Follow-up     Tina Erickson is a 78 y.o. female with hypertension, hyperlipidemia, paroxysmal atrial tachcyardia, h/o pancreatitis, family h/o CAD   History of Present Illness  Patient has recently felt vague chest discomfort, both with rest and physical activity.  She denies any exertional dyspnea.  She is compliant with medical therapy.  Blood pressure is well-controlled.  She has not any recent palpitation symptoms.   Vitals:   11/01/23 1120  BP: 121/72  Pulse: 68  Resp: 16  SpO2: 98%      Review of Systems  Cardiovascular:  Positive for chest pain. Negative for dyspnea on exertion, leg swelling, palpitations and syncope.        Studies Reviewed: Aaron Aas         Independently interpreted 04/2023: Chol 172, TG 121, HDL 74, LDL 77     Physical Exam Vitals and nursing note reviewed.  Constitutional:      General: She is not in acute distress. Neck:     Vascular: No JVD.  Cardiovascular:     Rate and Rhythm: Normal rate and regular rhythm.     Heart sounds: Normal heart sounds. No murmur heard. Pulmonary:     Effort: Pulmonary effort is normal.     Breath sounds: Normal breath sounds. No wheezing or rales.  Musculoskeletal:     Right lower leg: No edema.     Left lower leg: No edema.      VISIT DIAGNOSES:   ICD-10-CM   1. Precordial pain  R07.2 Basic metabolic panel with GFR    CT CORONARY MORPH W/CTA COR W/SCORE W/CA W/CM &/OR WO/CM    ECHOCARDIOGRAM COMPLETE    2. Mixed hyperlipidemia  E78.2     3. Essential hypertension  I10        Tina Erickson is a 78 y.o. female with hypertension, hyperlipidemia, paroxysmal atrial tachcyardia, h/o  pancreatitis, family h/o CAD   Assessment & Plan  Chest pain: Atypical chest pain, but has risk factors with long history of hyperlipidemia there is well-controlled only recently.  Recommend coronary CT angiogram for coronary anatomy evaluation.  PAC/PAT: Well-controlled with metoprolol .  Hypertension: Well-controlled on control succinate and valsartan .   Hyperlipidemia: Well-controlled, continue rosuvastatin  20 mg daily.         Meds ordered this encounter  Medications   metoprolol  tartrate (LOPRESSOR ) 100 MG tablet    Sig: Take 1 tablet (100 mg total) by mouth once for 1 dose. Take 90-120 minutes prior to scan. Hold for SBP less than 110.    Dispense:  1 tablet    Refill:  0     F/u in 6 months  Signed, Cody Das, MD

## 2023-11-04 DIAGNOSIS — H2000 Unspecified acute and subacute iridocyclitis: Secondary | ICD-10-CM | POA: Diagnosis not present

## 2023-11-04 DIAGNOSIS — H10413 Chronic giant papillary conjunctivitis, bilateral: Secondary | ICD-10-CM | POA: Diagnosis not present

## 2023-11-07 ENCOUNTER — Ambulatory Visit
Admission: RE | Admit: 2023-11-07 | Discharge: 2023-11-07 | Disposition: A | Discharge status: Home / Self Care | Payer: Medicare HMO | Source: Ambulatory Visit | Attending: Internal Medicine | Admitting: Internal Medicine

## 2023-11-07 ENCOUNTER — Telehealth: Payer: Self-pay | Admitting: Cardiology

## 2023-11-07 DIAGNOSIS — R918 Other nonspecific abnormal finding of lung field: Secondary | ICD-10-CM | POA: Diagnosis not present

## 2023-11-07 MED ORDER — IOPAMIDOL (ISOVUE-370) INJECTION 76%
80.0000 mL | Freq: Once | INTRAVENOUS | Status: AC | PRN
  Administered 2023-11-07: 80 mL via INTRAVENOUS

## 2023-11-07 NOTE — Telephone Encounter (Signed)
  Reason for walk-in: Walk-in Reasons: medication refill ; Pt walked in asking about a med she is supposed to take prior to a scan. Needs clarification.  If patient is requesting to be seen today, or if patient is having symptoms:  What symptoms are being reported (if any)?  None.  Route to triage pool and ensure Teams message has been sent to the Triage Walk-In chat.  3.   For medication samples, medication refills, HIM requests, appointment requests, lab-related requests, or form/record drop-off, please route to the appropriate pool.

## 2023-11-07 NOTE — Telephone Encounter (Signed)
 Patient is aware the metoprolol  is for her to take 90 mins before CT scan. She verbalized understanding

## 2023-11-08 DIAGNOSIS — H2000 Unspecified acute and subacute iridocyclitis: Secondary | ICD-10-CM | POA: Diagnosis not present

## 2023-11-08 DIAGNOSIS — H10502 Unspecified blepharoconjunctivitis, left eye: Secondary | ICD-10-CM | POA: Diagnosis not present

## 2023-11-11 DIAGNOSIS — R072 Precordial pain: Secondary | ICD-10-CM | POA: Diagnosis not present

## 2023-11-12 LAB — BASIC METABOLIC PANEL WITH GFR
BUN/Creatinine Ratio: 13 (ref 12–28)
BUN: 12 mg/dL (ref 8–27)
CO2: 21 mmol/L (ref 20–29)
Calcium: 9.6 mg/dL (ref 8.7–10.3)
Chloride: 100 mmol/L (ref 96–106)
Creatinine, Ser: 0.9 mg/dL (ref 0.57–1.00)
Glucose: 98 mg/dL (ref 70–99)
Potassium: 4.7 mmol/L (ref 3.5–5.2)
Sodium: 136 mmol/L (ref 134–144)
eGFR: 65 mL/min/{1.73_m2} (ref >59)

## 2023-11-22 ENCOUNTER — Ambulatory Visit: Payer: Self-pay | Admitting: Internal Medicine

## 2023-11-26 ENCOUNTER — Ambulatory Visit
Admission: EM | Admit: 2023-11-26 | Discharge: 2023-11-26 | Disposition: A | Discharge status: Home / Self Care | Attending: Internal Medicine | Admitting: Internal Medicine

## 2023-11-26 DIAGNOSIS — L03032 Cellulitis of left toe: Secondary | ICD-10-CM | POA: Diagnosis not present

## 2023-11-26 DIAGNOSIS — M4852XD Collapsed vertebra, not elsewhere classified, cervical region, subsequent encounter for fracture with routine healing: Secondary | ICD-10-CM | POA: Insufficient documentation

## 2023-11-26 DIAGNOSIS — R4181 Age-related cognitive decline: Secondary | ICD-10-CM | POA: Insufficient documentation

## 2023-11-26 DIAGNOSIS — R413 Other amnesia: Secondary | ICD-10-CM | POA: Insufficient documentation

## 2023-11-26 DIAGNOSIS — S12100A Unspecified displaced fracture of second cervical vertebra, initial encounter for closed fracture: Secondary | ICD-10-CM | POA: Insufficient documentation

## 2023-11-26 MED ORDER — CEPHALEXIN 500 MG PO CAPS: 500.0000 mg | ORAL_CAPSULE | Freq: Two times a day (BID) | ORAL | 0 refills | Status: AC

## 2023-11-26 NOTE — ED Provider Notes (Signed)
 [facility]    CSN: [id number] Arrival date & time: 11/26/23  1656      History   Chief Complaint Chief Complaint  Patient presents with   Toe Problem (redness)    HPI Tina Erickson is a 78 y.o. female.   Pain and swelling to the proximal nail fold of the left great toe She has noted some purulent drainage from the site. Using epsom salt soaks with some relief      Past Medical History:  Diagnosis Date   Acid reflux    just occasionally   Arthritis    at the base of my spine   Gastritis    Hyperlipidemia    Hypertension    Pancreatitis     Patient Active Problem List   Diagnosis Date Noted   Collapsed vertebra, not elsewhere classified, cervical region, subsequent encounter for fracture with routine healing 11/26/2023   Age-related cognitive decline 11/26/2023   Memory change 11/26/2023   Closed odontoid fracture (HCC) 11/26/2023   Arthralgia of left knee 05/30/2023   Preop cardiovascular exam 05/20/2023   Lung nodules 05/01/2023   Paroxysmal atrial tachycardia (HCC) 10/16/2022   Syncope and collapse 09/19/2020   Premature atrial contraction 09/19/2020   Grief reaction 03/30/2020   Hereditary disorder of nervous system 12/31/2019   Constipation 11/13/2019   Incontinence of feces 11/13/2019   Abscess of middle lobe of right lung without pneumonia (HCC) 09/28/2019   Tinnitus of both ears 09/24/2019   Impacted cerumen 09/21/2019   Tinnitus 09/21/2019   Anemia 05/11/2019   Lung field abnormal 01/12/2019   Essential hypertension 12/31/2018   Precordial pain 11/19/2018   Paresthesia 11/19/2018   Mixed hyperlipidemia 11/19/2018   Family history of early CAD 11/19/2018   Pancreatitis    Gastritis    Acute pancreatitis 06/20/2018   Gastro-esophageal reflux disease without esophagitis 06/16/2018   Encounter for screening for other disorder 04/28/2015   Encounter for general adult medical examination without abnormal findings 04/15/2015    Varicose veins of unspecified lower extremity with inflammation 03/21/2015   Idiopathic scoliosis 04/16/2014   Abnormal results of liver function studies 03/25/2013   Urinary incontinence 03/25/2013   Osteoporosis with current pathological fracture with routine healing 04/01/2012   Hyperlipidemia 02/11/2012    Past Surgical History:  Procedure Laterality Date   BREAST BIOPSY Left 2000   CATARACT EXTRACTION, BILATERAL     fractured tibia repair     INTERCOSTAL NERVE BLOCK Right 09/28/2019   Procedure: Intercostal Nerve Block;  Surgeon: Hilarie Lovely, MD;  Location: [facility] OR;  Service: Thoracic;  Laterality: Right;   NODE DISSECTION Right 09/28/2019   Procedure: Node Dissection;  Surgeon: Hilarie Lovely, MD;  Location: [facility] OR;  Service: Thoracic;  Laterality: Right;   RHINOPLASTY     VIDEO BRONCHOSCOPY N/A 09/28/2019   Procedure: VIDEO BRONCHOSCOPY;  Surgeon: Hilarie Lovely, MD;  Location: [facility] OR;  Service: Thoracic;  Laterality: N/A;    OB History   No obstetric history on file.      Home Medications    Prior to Admission medications   Medication Sig Start Date End Date Taking? Authorizing Provider  Apoaequorin (PREVAGEN PO) Take 1 tablet by mouth daily.    Yes [provider]  Cholecalciferol (VITAMIN D) 50 MCG (2000 UT) tablet Take 1 tablet by mouth daily. 03/25/13  Yes [provider]  co-enzyme Q-10 50 MG capsule Take 50 mg by mouth daily.   Yes [provider]  Oksana Bergamo Oil (OMEGA-3) 500 MG CAPS Take by mouth. 09/24/19  Yes [provider]  metoprolol  tartrate (LOPRESSOR ) 100 MG tablet Take 1 tablet (100 mg total) by mouth once for 1 dose. Take 90-120 minutes prior to scan. Hold for SBP less than 110. 11/01/23 11/26/23 Yes Patwardhan, Manish J, MD  milk thistle 175 MG tablet Take 175 mg by mouth daily.    Yes [provider]  Multiple Vitamins-Minerals (HAIR SKIN AND NAILS FORMULA PO) Take 1 Capful by mouth daily at 2 PM.   Yes  [provider]  rosuvastatin  (CRESTOR ) 20 MG tablet Take 1 tablet (20 mg total) by mouth at bedtime. 05/20/23  Yes Patwardhan, Kaye Parsons, MD  Saccharomyces boulardii (PROBIOTIC) 250 MG CAPS Take 1 capsule by mouth in the morning and at bedtime. 10/09/19  Yes Antonio Baumgarten, NP  valsartan  (DIOVAN ) 80 MG tablet Take 1 tablet (80 mg total) by mouth at bedtime. 05/20/23  Yes Patwardhan, Manish J, MD  vitamin E 180 MG (400 UNITS) capsule Take 400 Units by mouth daily.   Yes [provider]  metoprolol  succinate (TOPROL -XL) 25 MG 24 hr tablet Take 1 tablet (25 mg total) by mouth daily. 05/20/23   Patwardhan, Kaye Parsons, MD  neomycin-polymyxin b-dexamethasone  (MAXITROL) 3.5-10000-0.1 SUSP Place 1 drop into both eyes every 6 (six) hours. 11/04/23   [provider]    Family History Family History  Problem Relation Age of Onset   Hernia Mother    Migraines Mother    Ulcers Mother    Heart failure Father    Ulcers Father    Breast cancer Sister 75   Migraines Sister    Obesity Sister     Social History Social History   Tobacco Use   Smoking status: Never   Smokeless tobacco: Never  Vaping Use   Vaping status: Never Used  Substance Use Topics   Alcohol  use: Yes    Comment: 2 per week   Drug use: No     Allergies   Pravachol [pravastatin] and Prednisone   Review of Systems Review of Systems   Physical Exam Triage Vital Signs ED Triage Vitals  Encounter Vitals Group     BP 11/26/23 1732 (!) 167/84     Systolic BP Percentile --      Diastolic BP Percentile --      Pulse Rate 11/26/23 1732 68     Resp 11/26/23 1732 18     Temp 11/26/23 1732 98.5 F (36.9 C)     Temp Source 11/26/23 1732 Oral     SpO2 11/26/23 1732 97 %     Weight 11/26/23 1729 118 lb 2.7 oz (53.6 kg)     Height 11/26/23 1729 5\' 3"  (1.6 m)     Head Circumference --      Peak Flow --      Pain Score 11/26/23 1728 0     Pain Loc --      Pain Education --      Exclude from  Growth Chart --    No data found.  Updated Vital Signs BP (!) 167/84 (BP Location: Left Arm)   Pulse 68   Temp 98.5 F (36.9 C) (Oral)   Resp 18   Ht 5\' 3"  (1.6 m)   Wt 118 lb 2.7 oz (53.6 kg)   SpO2 97%   BMI 20.93 kg/m   Visual Acuity Right Eye Distance:   Left Eye Distance:   Bilateral Distance:  Right Eye Near:   Left Eye Near:    Bilateral Near:     Physical Exam   UC Treatments / Results  Labs (all labs ordered are listed, but only abnormal results are displayed) Labs Reviewed - No data to display  EKG   Radiology No results found.  Procedures Procedures (including critical care time)  Medications Ordered in UC Medications - No data to display  Initial Impression / Assessment and Plan / UC Course  I have reviewed the triage vital signs and the nursing notes.  Pertinent labs & imaging results that were available during my care of the patient were reviewed by me and considered in my medical decision making (see chart for details).     *** Final Clinical Impressions(s) / UC Diagnoses   Final diagnoses:  None   Discharge Instructions   None    ED Prescriptions   None    PDMP not reviewed this encounter.

## 2023-11-26 NOTE — ED Triage Notes (Signed)
"  My left great toe has some redness just below nail". "I think I have an infection v/s fungal infection". No injury.

## 2023-11-26 NOTE — Discharge Instructions (Addendum)
 You have cellulitis which is a superficial skin infection.  Take antibiotic every 12 hours with a snack/food for the next 7 days. Watch for worsening signs of infection to the lesion such as spreading redness, swelling, pus drainage, pain, fever/chills. Continue epsom salt soaks. Return if you develop fever, chills, nausea, vomiting, etc as well.  If you develop any new or worsening symptoms or if your symptoms do not start to improve, please return here or follow-up with your primary care provider. If your symptoms are severe, please go to the emergency room.

## 2023-12-02 ENCOUNTER — Other Ambulatory Visit: Payer: Self-pay | Admitting: Internal Medicine

## 2023-12-02 DIAGNOSIS — Z1231 Encounter for screening mammogram for malignant neoplasm of breast: Secondary | ICD-10-CM

## 2023-12-03 ENCOUNTER — Other Ambulatory Visit (HOSPITAL_COMMUNITY)

## 2023-12-05 ENCOUNTER — Ambulatory Visit (HOSPITAL_COMMUNITY)

## 2023-12-12 DIAGNOSIS — M810 Age-related osteoporosis without current pathological fracture: Secondary | ICD-10-CM | POA: Diagnosis not present

## 2023-12-13 ENCOUNTER — Telehealth: Payer: Self-pay

## 2023-12-13 NOTE — Telephone Encounter (Signed)
 Left message to call back

## 2023-12-13 NOTE — Telephone Encounter (Signed)
-----   Message from Nurse Altha Jester sent at 12/13/2023  7:26 AM EDT ----- Regarding: FW: ct scan  ----- Message ----- From: Judge Notice Sent: 12/12/2023  10:21 AM EDT To: Luwana Salvo, RN Subject: ct scan                                        Good Morning,   She was schedule for her cardiac ct scan. She is refusing to take the metoprolol  pill. She is now also not sure if she want to have this test due to her concerns. Can someone from the office contact her to speak with her about her concerns?   Thanks, [place]

## 2023-12-16 ENCOUNTER — Ambulatory Visit (HOSPITAL_COMMUNITY)
Admission: RE | Admit: 2023-12-16 | Discharge: 2023-12-16 | Disposition: A | Discharge status: Home / Self Care | Source: Ambulatory Visit | Attending: Cardiology | Admitting: Cardiology

## 2023-12-16 ENCOUNTER — Ambulatory Visit: Payer: Self-pay | Admitting: Cardiology

## 2023-12-16 DIAGNOSIS — Z79899 Other long term (current) drug therapy: Secondary | ICD-10-CM | POA: Diagnosis not present

## 2023-12-16 DIAGNOSIS — R072 Precordial pain: Secondary | ICD-10-CM | POA: Diagnosis not present

## 2023-12-16 DIAGNOSIS — I10 Essential (primary) hypertension: Secondary | ICD-10-CM | POA: Diagnosis not present

## 2023-12-16 DIAGNOSIS — M810 Age-related osteoporosis without current pathological fracture: Secondary | ICD-10-CM | POA: Diagnosis not present

## 2023-12-16 LAB — ECHOCARDIOGRAM COMPLETE: S' Lateral: 2.23 cm

## 2023-12-23 ENCOUNTER — Other Ambulatory Visit (HOSPITAL_COMMUNITY)
Admission: RE | Admit: 2023-12-23 | Discharge: 2023-12-23 | Disposition: A | Discharge status: Home / Self Care | Source: Ambulatory Visit | Attending: Internal Medicine | Admitting: Internal Medicine

## 2023-12-23 ENCOUNTER — Telehealth (HOSPITAL_COMMUNITY): Payer: Self-pay | Admitting: Pharmacy Technician

## 2023-12-23 DIAGNOSIS — M8580 Other specified disorders of bone density and structure, unspecified site: Secondary | ICD-10-CM | POA: Diagnosis not present

## 2023-12-23 DIAGNOSIS — Z01818 Encounter for other preprocedural examination: Secondary | ICD-10-CM | POA: Diagnosis present

## 2023-12-23 DIAGNOSIS — Z7983 Long term (current) use of bisphosphonates: Secondary | ICD-10-CM | POA: Diagnosis not present

## 2023-12-23 DIAGNOSIS — M810 Age-related osteoporosis without current pathological fracture: Secondary | ICD-10-CM | POA: Insufficient documentation

## 2023-12-23 DIAGNOSIS — Z01812 Encounter for preprocedural laboratory examination: Secondary | ICD-10-CM | POA: Diagnosis not present

## 2023-12-23 LAB — BASIC METABOLIC PANEL WITH GFR
Anion gap: 9 (ref 5–15)
BUN: 13 mg/dL (ref 8–23)
CO2: 26 mmol/L (ref 22–32)
Calcium: 9.1 mg/dL (ref 8.9–10.3)
Chloride: 101 mmol/L (ref 98–111)
Creatinine, Ser: 0.93 mg/dL (ref 0.44–1.00)
GFR, Estimated: >60 mL/min (ref >60)
Glucose, Bld: 145 mg/dL — ABNORMAL HIGH (ref 70–99)
Potassium: 3.5 mmol/L (ref 3.5–5.1)
Sodium: 136 mmol/L (ref 135–145)

## 2023-12-23 LAB — MAGNESIUM: Magnesium: 2 mg/dL (ref 1.7–2.4)

## 2023-12-23 LAB — PHOSPHORUS: Phosphorus: 3.7 mg/dL (ref 2.5–4.6)

## 2023-12-23 NOTE — Telephone Encounter (Signed)
 Left message to call back

## 2023-12-23 NOTE — Telephone Encounter (Signed)
 Auth Submission: NO AUTH NEEDED Site of care: Site of care: [facility] Payer: [employer] Medicare Medication & CPT/J Code(s) submitted: Reclast  (Zolendronic acid) [id number] Diagnosis Code: m81.0 Route of submission (phone, fax, portal):  Phone # Fax # Auth type: Buy/Bill HB Units/visits requested: 5mg  x 1 dose Reference number:  Approval from: 12/23/23 to 06/24/24        Dagoberto Armour, CPhT [facility] [phone]

## 2023-12-25 NOTE — Telephone Encounter (Signed)
 Left message to call back. Will wait for patient to call back.

## 2023-12-30 NOTE — Telephone Encounter (Signed)
 Spoke with the patient who is very confused about the CT scan that was ordered by Dr. Elmira. I explained to her what the test was and the reason that it was ordered. Patient also had an echocardiogram done recently and results were reviewed with her. Patient states that she doesn't want to have any additional testing done at this time. She will call back if she changes her mind about having a coronary CTA.

## 2023-12-30 NOTE — Telephone Encounter (Signed)
 Pt was returning nurse call and is requesting a callback. Please advise.

## 2023-12-31 ENCOUNTER — Ambulatory Visit
Admission: RE | Admit: 2023-12-31 | Discharge: 2023-12-31 | Disposition: A | Discharge status: Home / Self Care | Source: Ambulatory Visit | Attending: Internal Medicine | Admitting: Internal Medicine

## 2023-12-31 DIAGNOSIS — Z1231 Encounter for screening mammogram for malignant neoplasm of breast: Secondary | ICD-10-CM

## 2024-01-06 ENCOUNTER — Other Ambulatory Visit: Payer: Self-pay | Admitting: Internal Medicine

## 2024-01-06 DIAGNOSIS — R928 Other abnormal and inconclusive findings on diagnostic imaging of breast: Secondary | ICD-10-CM

## 2024-01-10 ENCOUNTER — Ambulatory Visit
Admission: RE | Admit: 2024-01-10 | Discharge: 2024-01-10 | Disposition: A | Discharge status: Home / Self Care | Source: Ambulatory Visit | Attending: Internal Medicine | Admitting: Internal Medicine

## 2024-01-10 DIAGNOSIS — R921 Mammographic calcification found on diagnostic imaging of breast: Secondary | ICD-10-CM | POA: Diagnosis not present

## 2024-01-10 DIAGNOSIS — R928 Other abnormal and inconclusive findings on diagnostic imaging of breast: Secondary | ICD-10-CM

## 2024-01-13 ENCOUNTER — Other Ambulatory Visit: Payer: Self-pay | Admitting: Internal Medicine

## 2024-01-13 DIAGNOSIS — R921 Mammographic calcification found on diagnostic imaging of breast: Secondary | ICD-10-CM

## 2024-01-15 ENCOUNTER — Inpatient Hospital Stay
Admission: RE | Admit: 2024-01-15 | Discharge: 2024-01-15 | Discharge status: Home / Self Care | Source: Ambulatory Visit | Attending: Internal Medicine | Admitting: Internal Medicine

## 2024-01-15 ENCOUNTER — Ambulatory Visit
Admission: RE | Admit: 2024-01-15 | Discharge: 2024-01-15 | Disposition: A | Discharge status: Home / Self Care | Source: Ambulatory Visit | Attending: Internal Medicine | Admitting: Internal Medicine

## 2024-01-15 DIAGNOSIS — R921 Mammographic calcification found on diagnostic imaging of breast: Secondary | ICD-10-CM

## 2024-01-15 HISTORY — PX: BREAST BIOPSY: SHX20

## 2024-01-16 LAB — SURGICAL PATHOLOGY

## 2024-01-31 DIAGNOSIS — R350 Frequency of micturition: Secondary | ICD-10-CM | POA: Diagnosis not present

## 2024-01-31 DIAGNOSIS — R300 Dysuria: Secondary | ICD-10-CM | POA: Diagnosis not present

## 2024-02-03 DIAGNOSIS — I10 Essential (primary) hypertension: Secondary | ICD-10-CM | POA: Diagnosis not present

## 2024-02-03 DIAGNOSIS — R32 Unspecified urinary incontinence: Secondary | ICD-10-CM | POA: Diagnosis not present

## 2024-02-03 DIAGNOSIS — R300 Dysuria: Secondary | ICD-10-CM | POA: Diagnosis not present

## 2024-02-03 DIAGNOSIS — N390 Urinary tract infection, site not specified: Secondary | ICD-10-CM | POA: Diagnosis not present

## 2024-02-06 ENCOUNTER — Ambulatory Visit
Admission: EM | Admit: 2024-02-06 | Discharge: 2024-02-06 | Disposition: A | Discharge status: Home / Self Care | Attending: Physician Assistant | Admitting: Physician Assistant

## 2024-02-06 ENCOUNTER — Other Ambulatory Visit: Payer: Self-pay

## 2024-02-06 ENCOUNTER — Encounter: Payer: Self-pay | Admitting: *Deleted

## 2024-02-06 DIAGNOSIS — L03114 Cellulitis of left upper limb: Secondary | ICD-10-CM | POA: Diagnosis not present

## 2024-02-06 DIAGNOSIS — N3000 Acute cystitis without hematuria: Secondary | ICD-10-CM | POA: Diagnosis not present

## 2024-02-06 LAB — POCT URINE DIPSTICK
Bilirubin, UA: NEGATIVE
Glucose, UA: NEGATIVE mg/dL
Ketones, POC UA: NEGATIVE mg/dL
Nitrite, UA: NEGATIVE
POC PROTEIN,UA: NEGATIVE
Spec Grav, UA: 1.015 (ref 1.010–1.025)
Urobilinogen, UA: 0.2 U/dL
pH, UA: 5.5 (ref 5.0–8.0)

## 2024-02-06 MED ORDER — SULFAMETHOXAZOLE-TRIMETHOPRIM 800-160 MG PO TABS: 1.0000 | ORAL_TABLET | Freq: Two times a day (BID) | ORAL | 0 refills | Status: AC

## 2024-02-06 NOTE — ED Triage Notes (Signed)
 Pt reports bee sting to left hand 2 days ago. Swelling and redness noted. She states it looks better. Reports it has been itching. She has been taking benadryl .  Also reports UTI symptoms painful for awhile.  She did a home UTI test and it was positive. She states she went to her PCP but they have not prescribed anything.

## 2024-02-06 NOTE — ED Provider Notes (Signed)
 [facility]    CSN: [id number] Arrival date & time: 02/06/24  9185      History   Chief Complaint Chief Complaint  Patient presents with   Insect Bite    HPI Tina Erickson is a 78 y.o. female.   Patient presents today for multiple complaints.  She reports that she had a bee sting to her left hand 2 days ago.  She notes she has had some swelling and erythema develop since that time.  Swelling was somewhat worse this morning.  She also notes some itching to the area.  She does not report any shortness of breath or trouble swallowing.  She tried taking Benadryl  with mild relief.  She is also used some ice to the area.  She also reports possible UTI.  She has had some discomfort with urination has been ongoing for a while. She has taken at home UTI test that was positive.  She denies fever, abdominal pain, back pain, nausea or vomiting.  The history is provided by the patient.    Past Medical History:  Diagnosis Date   Acid reflux    just occasionally   Arthritis    at the base of my spine   Gastritis    Hyperlipidemia    Hypertension    Pancreatitis     Patient Active Problem List   Diagnosis Date Noted   Collapsed vertebra, not elsewhere classified, cervical region, subsequent encounter for fracture with routine healing 11/26/2023   Age-related cognitive decline 11/26/2023   Memory change 11/26/2023   Closed odontoid fracture (HCC) 11/26/2023   Arthralgia of left knee 05/30/2023   Preop cardiovascular exam 05/20/2023   Lung nodules 05/01/2023   Paroxysmal atrial tachycardia (HCC) 10/16/2022   Syncope and collapse 09/19/2020   Premature atrial contraction 09/19/2020   Grief reaction 03/30/2020   Hereditary disorder of nervous system 12/31/2019   Constipation 11/13/2019   Incontinence of feces 11/13/2019   Abscess of middle lobe of right lung without pneumonia (HCC) 09/28/2019   Tinnitus of both ears 09/24/2019   Impacted cerumen 09/21/2019    Tinnitus 09/21/2019   Anemia 05/11/2019   Lung field abnormal 01/12/2019   Essential hypertension 12/31/2018   Precordial pain 11/19/2018   Paresthesia 11/19/2018   Mixed hyperlipidemia 11/19/2018   Family history of early CAD 11/19/2018   Pancreatitis    Gastritis    Acute pancreatitis 06/20/2018   Gastro-esophageal reflux disease without esophagitis 06/16/2018   Encounter for screening for other disorder 04/28/2015   Encounter for general adult medical examination without abnormal findings 04/15/2015   Varicose veins of unspecified lower extremity with inflammation 03/21/2015   Idiopathic scoliosis 04/16/2014   Abnormal results of liver function studies 03/25/2013   Urinary incontinence 03/25/2013   Osteoporosis with current pathological fracture with routine healing 04/01/2012   Hyperlipidemia 02/11/2012    Past Surgical History:  Procedure Laterality Date   BREAST BIOPSY Left 2000   BREAST BIOPSY Left 01/15/2024   MM LT BREAST BX W LOC DEV 1ST LESION IMAGE BX SPEC STEREO GUIDE 01/15/2024 [facility]   CATARACT EXTRACTION, BILATERAL     fractured tibia repair     INTERCOSTAL NERVE BLOCK Right 09/28/2019   Procedure: Intercostal Nerve Block;  Surgeon: Shyrl Linnie KIDD, MD;  Location: [facility] OR;  Service: Thoracic;  Laterality: Right;   NODE DISSECTION Right 09/28/2019   Procedure: Node Dissection;  Surgeon: Shyrl Linnie KIDD, MD;  Location: [facility];  Service: Thoracic;  Laterality: Right;  RHINOPLASTY     VIDEO BRONCHOSCOPY N/A 09/28/2019   Procedure: VIDEO BRONCHOSCOPY;  Surgeon: Shyrl Linnie KIDD, MD;  Location: [facility];  Service: Thoracic;  Laterality: N/A;    OB History   No obstetric history on file.      Home Medications    Prior to Admission medications   Medication Sig Start Date End Date Taking? Authorizing Provider  Apoaequorin (PREVAGEN PO) Take 1 tablet by mouth daily.    Yes [provider]  Cholecalciferol (VITAMIN D) 50 MCG (2000 UT) tablet  Take 1 tablet by mouth daily. 03/25/13  Yes [provider]  co-enzyme Q-10 50 MG capsule Take 50 mg by mouth daily.   Yes [provider]  Anselm Frizzle (OMEGA-3) 500 MG CAPS Take by mouth. 09/24/19  Yes [provider]  metoprolol  succinate (TOPROL -XL) 25 MG 24 hr tablet Take 1 tablet (25 mg total) by mouth daily. 05/20/23  Yes Patwardhan, Manish J, MD  milk thistle 175 MG tablet Take 175 mg by mouth daily.    Yes [provider]  Multiple Vitamins-Minerals (HAIR SKIN AND NAILS FORMULA PO) Take 1 Capful by mouth daily at 2 PM.   Yes [provider]  rosuvastatin  (CRESTOR ) 20 MG tablet Take 1 tablet (20 mg total) by mouth at bedtime. 05/20/23  Yes Patwardhan, Newman PARAS, MD  Saccharomyces boulardii (PROBIOTIC) 250 MG CAPS Take 1 capsule by mouth in the morning and at bedtime. 10/09/19  Yes Hope Almarie ORN, NP  sulfamethoxazole -trimethoprim  (BACTRIM  DS) 800-160 MG tablet Take 1 tablet by mouth 2 (two) times daily for 7 days. 02/06/24 02/13/24 Yes Billy Asberry FALCON, PA-C  valsartan  (DIOVAN ) 80 MG tablet Take 1 tablet (80 mg total) by mouth at bedtime. 05/20/23  Yes Patwardhan, Manish J, MD  vitamin E 180 MG (400 UNITS) capsule Take 400 Units by mouth daily.   Yes [provider]  metoprolol  tartrate (LOPRESSOR ) 100 MG tablet Take 1 tablet (100 mg total) by mouth once for 1 dose. Take 90-120 minutes prior to scan. Hold for SBP less than 110. Patient not taking: Reported on 02/06/2024 11/01/23 11/26/23  Elmira Newman PARAS, MD  neomycin-polymyxin b-dexamethasone  (MAXITROL) 3.5-10000-0.1 SUSP Place 1 drop into both eyes every 6 (six) hours. Patient not taking: Reported on 02/06/2024 11/04/23   [provider]    Family History Family History  Problem Relation Age of Onset   Hernia Mother    Migraines Mother    Ulcers Mother    Heart failure Father    Ulcers Father    Breast cancer Sister 75   Migraines Sister    Obesity Sister     Social  History Social History   Tobacco Use   Smoking status: Never   Smokeless tobacco: Never  Vaping Use   Vaping status: Never Used  Substance Use Topics   Alcohol  use: Yes    Comment: 2 per week   Drug use: No     Allergies   Pravachol [pravastatin] and Prednisone   Review of Systems Review of Systems  Constitutional:  Negative for chills and fever.  Eyes:  Negative for discharge and redness.  Respiratory:  Negative for shortness of breath.   Gastrointestinal:  Negative for abdominal pain, nausea and vomiting.  Genitourinary:  Positive for dysuria.  Skin:  Positive for color change.     Physical Exam Triage Vital Signs ED Triage Vitals  Encounter Vitals Group     BP      Girls Systolic BP  Percentile      Girls Diastolic BP Percentile      Boys Systolic BP Percentile      Boys Diastolic BP Percentile      Pulse      Resp      Temp      Temp src      SpO2      Weight      Height      Head Circumference      Peak Flow      Pain Score      Pain Loc      Pain Education      Exclude from Growth Chart    No data found.  Updated Vital Signs BP 99/66 (BP Location: Right Arm)   Pulse 61   Temp 98 F (36.7 C) (Oral)   Resp 16   SpO2 98%   Visual Acuity Right Eye Distance:   Left Eye Distance:   Bilateral Distance:    Right Eye Near:   Left Eye Near:    Bilateral Near:     Physical Exam Vitals and nursing note reviewed.  Constitutional:      General: She is not in acute distress.    Appearance: Normal appearance. She is not ill-appearing.  HENT:     Head: Normocephalic and atraumatic.  Eyes:     Conjunctiva/sclera: Conjunctivae normal.  Cardiovascular:     Rate and Rhythm: Normal rate.  Pulmonary:     Effort: Pulmonary effort is normal. No respiratory distress.  Skin:    Comments: See photo of appearance of left hand  Neurological:     Mental Status: She is alert.  Psychiatric:        Mood and Affect: Mood normal.        Behavior: Behavior  normal.        Thought Content: Thought content normal.      UC Treatments / Results  Labs (all labs ordered are listed, but only abnormal results are displayed) Labs Reviewed  POCT URINE DIPSTICK - Abnormal; Notable for the following components:      Result Value   Blood, UA trace-intact (*)    Leukocytes, UA Small (1+) (*)    All other components within normal limits  URINE CULTURE    EKG   Radiology No results found.  Procedures Procedures (including critical care time)  Medications Ordered in UC Medications - No data to display  Initial Impression / Assessment and Plan / UC Course  I have reviewed the triage vital signs and the nursing notes.  Pertinent labs & imaging results that were available during my care of the patient were reviewed by me and considered in my medical decision making (see chart for details).    Bactrim  prescribed to cover UTI as well as cellulitis.  Advised she continue to ice to help with swelling. Urine culture ordered. Encouraged follow-up if no gradual improvement with any further concerns.  Final Clinical Impressions(s) / UC Diagnoses   Final diagnoses:  Acute cystitis without hematuria  Cellulitis of left hand   Discharge Instructions   None    ED Prescriptions     Medication Sig Dispense Auth. Provider   sulfamethoxazole -trimethoprim  (BACTRIM  DS) 800-160 MG tablet Take 1 tablet by mouth 2 (two) times daily for 7 days. 14 tablet Billy Asberry FALCON, PA-C      PDMP not reviewed this encounter.   Billy Asberry FALCON, PA-C 02/06/24 1751

## 2024-02-07 LAB — URINE CULTURE

## 2024-02-10 DIAGNOSIS — R32 Unspecified urinary incontinence: Secondary | ICD-10-CM | POA: Diagnosis not present

## 2024-02-10 DIAGNOSIS — N939 Abnormal uterine and vaginal bleeding, unspecified: Secondary | ICD-10-CM | POA: Diagnosis not present

## 2024-02-10 DIAGNOSIS — R413 Other amnesia: Secondary | ICD-10-CM | POA: Diagnosis not present

## 2024-02-10 DIAGNOSIS — R300 Dysuria: Secondary | ICD-10-CM | POA: Diagnosis not present

## 2024-02-10 DIAGNOSIS — I10 Essential (primary) hypertension: Secondary | ICD-10-CM | POA: Diagnosis not present

## 2024-02-11 ENCOUNTER — Ambulatory Visit (HOSPITAL_COMMUNITY): Payer: Self-pay

## 2024-02-12 ENCOUNTER — Ambulatory Visit: Admission: EM | Admit: 2024-02-12 | Discharge: 2024-02-12 | Disposition: A | Discharge status: Home / Self Care

## 2024-02-12 DIAGNOSIS — R197 Diarrhea, unspecified: Secondary | ICD-10-CM

## 2024-02-12 NOTE — ED Provider Notes (Signed)
 [facility]    CSN: [id number] Arrival date & time: 02/12/24  0901      History   Chief Complaint Chief Complaint  Patient presents with   Diarrhea    HPI Tina Erickson is a 78 y.o. female.   Patient here today for diarrhea that started last night.  She reports that has improved this morning.  She denies any blood in her stool or dark tarry stools.  She has not had abdominal pain.  She denies any nausea or vomiting.  She did take some over-the-counter medication and is not sure if this was helpful.  She denies any recent travel or questionable foods that might of caused symptoms.  The history is provided by the patient.  Diarrhea Associated symptoms: no abdominal pain, no chills, no fever and no vomiting     Past Medical History:  Diagnosis Date   Acid reflux    just occasionally   Arthritis    at the base of my spine   Gastritis    Hyperlipidemia    Hypertension    Pancreatitis     Patient Active Problem List   Diagnosis Date Noted   Collapsed vertebra, not elsewhere classified, cervical region, subsequent encounter for fracture with routine healing 11/26/2023   Age-related cognitive decline 11/26/2023   Memory change 11/26/2023   Closed odontoid fracture (HCC) 11/26/2023   Arthralgia of left knee 05/30/2023   Preop cardiovascular exam 05/20/2023   Lung nodules 05/01/2023   Paroxysmal atrial tachycardia (HCC) 10/16/2022   Syncope and collapse 09/19/2020   Premature atrial contraction 09/19/2020   Grief reaction 03/30/2020   Hereditary disorder of nervous system 12/31/2019   Constipation 11/13/2019   Incontinence of feces 11/13/2019   Abscess of middle lobe of right lung without pneumonia (HCC) 09/28/2019   Tinnitus of both ears 09/24/2019   Impacted cerumen 09/21/2019   Tinnitus 09/21/2019   Anemia 05/11/2019   Lung field abnormal 01/12/2019   Essential hypertension 12/31/2018   Precordial pain 11/19/2018   Paresthesia 11/19/2018   Mixed  hyperlipidemia 11/19/2018   Family history of early CAD 11/19/2018   Pancreatitis    Gastritis    Acute pancreatitis 06/20/2018   Gastro-esophageal reflux disease without esophagitis 06/16/2018   Encounter for screening for other disorder 04/28/2015   Encounter for general adult medical examination without abnormal findings 04/15/2015   Varicose veins of unspecified lower extremity with inflammation 03/21/2015   Idiopathic scoliosis 04/16/2014   Abnormal results of liver function studies 03/25/2013   Urinary incontinence 03/25/2013   Osteoporosis with current pathological fracture with routine healing 04/01/2012   Hyperlipidemia 02/11/2012    Past Surgical History:  Procedure Laterality Date   BREAST BIOPSY Left 2000   BREAST BIOPSY Left 01/15/2024   MM LT BREAST BX W LOC DEV 1ST LESION IMAGE BX SPEC STEREO GUIDE 01/15/2024 [facility]   CATARACT EXTRACTION, BILATERAL     fractured tibia repair     INTERCOSTAL NERVE BLOCK Right 09/28/2019   Procedure: Intercostal Nerve Block;  Surgeon: Shyrl Linnie KIDD, MD;  Location: [facility];  Service: Thoracic;  Laterality: Right;   NODE DISSECTION Right 09/28/2019   Procedure: Node Dissection;  Surgeon: Shyrl Linnie KIDD, MD;  Location: [facility];  Service: Thoracic;  Laterality: Right;   RHINOPLASTY     VIDEO BRONCHOSCOPY N/A 09/28/2019   Procedure: VIDEO BRONCHOSCOPY;  Surgeon: Shyrl Linnie KIDD, MD;  Location: [facility];  Service: Thoracic;  Laterality: N/A;    OB History  No obstetric history on file.      Home Medications    Prior to Admission medications   Medication Sig Start Date End Date Taking? Authorizing Provider  Apoaequorin (PREVAGEN PO) Take 1 tablet by mouth daily.     [provider]  Cholecalciferol (VITAMIN D) 50 MCG (2000 UT) tablet Take 1 tablet by mouth daily. 03/25/13   [provider]  co-enzyme Q-10 50 MG capsule Take 50 mg by mouth daily.    [provider]  Anselm Frizzle (OMEGA-3) 500 MG  CAPS Take by mouth. 09/24/19   [provider]  metoprolol  succinate (TOPROL -XL) 25 MG 24 hr tablet Take 1 tablet (25 mg total) by mouth daily. 05/20/23   Patwardhan, Newman PARAS, MD  metoprolol  tartrate (LOPRESSOR ) 100 MG tablet Take 1 tablet (100 mg total) by mouth once for 1 dose. Take 90-120 minutes prior to scan. Hold for SBP less than 110. Patient not taking: Reported on 02/06/2024 11/01/23 11/26/23  Elmira Newman PARAS, MD  milk thistle 175 MG tablet Take 175 mg by mouth daily.     [provider]  Multiple Vitamins-Minerals (HAIR SKIN AND NAILS FORMULA PO) Take 1 Capful by mouth daily at 2 PM.    [provider]  neomycin-polymyxin b-dexamethasone  (MAXITROL) 3.5-10000-0.1 SUSP Place 1 drop into both eyes every 6 (six) hours. Patient not taking: Reported on 02/06/2024 11/04/23   [provider]  rosuvastatin  (CRESTOR ) 20 MG tablet Take 1 tablet (20 mg total) by mouth at bedtime. 05/20/23   Patwardhan, Newman PARAS, MD  Saccharomyces boulardii (PROBIOTIC) 250 MG CAPS Take 1 capsule by mouth in the morning and at bedtime. 10/09/19   Hope Almarie ORN, NP  sulfamethoxazole -trimethoprim  (BACTRIM  DS) 800-160 MG tablet Take 1 tablet by mouth 2 (two) times daily for 7 days. 02/06/24 02/13/24  Billy Asberry FALCON, PA-C  valsartan  (DIOVAN ) 80 MG tablet Take 1 tablet (80 mg total) by mouth at bedtime. 05/20/23   Patwardhan, Newman PARAS, MD  vitamin E 180 MG (400 UNITS) capsule Take 400 Units by mouth daily.    [provider]    Family History Family History  Problem Relation Age of Onset   Hernia Mother    Migraines Mother    Ulcers Mother    Heart failure Father    Ulcers Father    Breast cancer Sister 55   Migraines Sister    Obesity Sister     Social History Social History   Tobacco Use   Smoking status: Never   Smokeless tobacco: Never  Vaping Use   Vaping status: Never Used  Substance Use Topics   Alcohol  use: Yes    Comment: 2 per week   Drug use: No      Allergies   Pravachol [pravastatin] and Prednisone   Review of Systems Review of Systems  Constitutional:  Negative for chills and fever.  Eyes:  Negative for discharge and redness.  Respiratory:  Negative for shortness of breath.   Gastrointestinal:  Positive for diarrhea. Negative for abdominal pain, nausea and vomiting.     Physical Exam Triage Vital Signs ED Triage Vitals  Encounter Vitals Group     BP 02/12/24 0926 111/71     Girls Systolic BP Percentile --      Girls Diastolic BP Percentile --      Boys Systolic BP Percentile --      Boys Diastolic BP Percentile --      Pulse Rate 02/12/24 0923 63  Resp 02/12/24 0923 18     Temp 02/12/24 0923 97.7 F (36.5 C)     Temp src --      SpO2 02/12/24 0923 98 %     Weight --      Height --      Head Circumference --      Peak Flow --      Pain Score 02/12/24 0922 0     Pain Loc --      Pain Education --      Exclude from Growth Chart --    No data found.  Updated Vital Signs BP 111/71   Pulse 63   Temp 97.7 F (36.5 C)   Resp 18   SpO2 98%   Visual Acuity Right Eye Distance:   Left Eye Distance:   Bilateral Distance:    Right Eye Near:   Left Eye Near:    Bilateral Near:     Physical Exam Vitals and nursing note reviewed.  Constitutional:      General: She is not in acute distress.    Appearance: Normal appearance. She is not ill-appearing.  HENT:     Head: Normocephalic and atraumatic.  Eyes:     Conjunctiva/sclera: Conjunctivae normal.  Cardiovascular:     Rate and Rhythm: Normal rate and regular rhythm.  Pulmonary:     Effort: Pulmonary effort is normal. No respiratory distress.     Breath sounds: No wheezing, rhonchi or rales.  Abdominal:     General: Abdomen is flat. Bowel sounds are normal. There is no distension.     Palpations: Abdomen is soft.     Tenderness: There is no abdominal tenderness. There is no guarding or rebound.  Neurological:     Mental Status: She is alert.   Psychiatric:        Mood and Affect: Mood normal.        Behavior: Behavior normal.        Thought Content: Thought content normal.      UC Treatments / Results  Labs (all labs ordered are listed, but only abnormal results are displayed) Labs Reviewed - No data to display  EKG   Radiology No results found.  Procedures Procedures (including critical care time)  Medications Ordered in UC Medications - No data to display  Initial Impression / Assessment and Plan / UC Course  I have reviewed the triage vital signs and the nursing notes.  Pertinent labs & imaging results that were available during my care of the patient were reviewed by me and considered in my medical decision making (see chart for details).    Exam reassuring and patient already experiencing improvement of symptoms.  Suspect possible viral etiology of symptoms and recommended she continue to monitor, and stay hydrated with some electrolyte replacement recommended.  Food list provided for hopeful improvement of diarrhea as well.  Encouraged follow-up if symptoms persist or worsen.  Final Clinical Impressions(s) / UC Diagnoses   Final diagnoses:  Diarrhea, unspecified type   Discharge Instructions   None    ED Prescriptions   None    PDMP not reviewed this encounter.   Billy Asberry FALCON, PA-C 02/12/24 1014

## 2024-02-12 NOTE — ED Triage Notes (Signed)
 Pt presents to uc with co loose stools since last night. Denies abd pain. Pt reports she attempted tyelnol and probiotic with minimal improvement.

## 2024-02-13 DIAGNOSIS — I10 Essential (primary) hypertension: Secondary | ICD-10-CM | POA: Diagnosis not present

## 2024-02-13 DIAGNOSIS — N939 Abnormal uterine and vaginal bleeding, unspecified: Secondary | ICD-10-CM | POA: Diagnosis not present

## 2024-02-13 DIAGNOSIS — R413 Other amnesia: Secondary | ICD-10-CM | POA: Diagnosis not present

## 2024-02-13 DIAGNOSIS — R300 Dysuria: Secondary | ICD-10-CM | POA: Diagnosis not present

## 2024-02-13 DIAGNOSIS — K219 Gastro-esophageal reflux disease without esophagitis: Secondary | ICD-10-CM | POA: Diagnosis not present

## 2024-02-13 DIAGNOSIS — R195 Other fecal abnormalities: Secondary | ICD-10-CM | POA: Diagnosis not present

## 2024-02-17 DIAGNOSIS — N939 Abnormal uterine and vaginal bleeding, unspecified: Secondary | ICD-10-CM | POA: Diagnosis not present

## 2024-02-17 DIAGNOSIS — R195 Other fecal abnormalities: Secondary | ICD-10-CM | POA: Diagnosis not present

## 2024-02-17 DIAGNOSIS — G3184 Mild cognitive impairment, so stated: Secondary | ICD-10-CM | POA: Diagnosis not present

## 2024-02-20 DIAGNOSIS — D1801 Hemangioma of skin and subcutaneous tissue: Secondary | ICD-10-CM | POA: Diagnosis not present

## 2024-02-20 DIAGNOSIS — Z85828 Personal history of other malignant neoplasm of skin: Secondary | ICD-10-CM | POA: Diagnosis not present

## 2024-02-20 DIAGNOSIS — D2262 Melanocytic nevi of left upper limb, including shoulder: Secondary | ICD-10-CM | POA: Diagnosis not present

## 2024-02-20 DIAGNOSIS — L570 Actinic keratosis: Secondary | ICD-10-CM | POA: Diagnosis not present

## 2024-02-20 DIAGNOSIS — L814 Other melanin hyperpigmentation: Secondary | ICD-10-CM | POA: Diagnosis not present

## 2024-02-20 DIAGNOSIS — D225 Melanocytic nevi of trunk: Secondary | ICD-10-CM | POA: Diagnosis not present

## 2024-02-20 DIAGNOSIS — L821 Other seborrheic keratosis: Secondary | ICD-10-CM | POA: Diagnosis not present

## 2024-02-20 NOTE — Progress Notes (Signed)
 78 y.o. postmenopausal female here for pelvic pain. Widowed.  No LMP recorded. Patient is postmenopausal.    She was evaluated by PCP 02/10/24 for dysuria and blood was noted in vaginal vault. She reports she woke up to use the restroom on 02/10/24 and noticed her urine was black. Had loose stool and it was black in color. No pain. Only happened once. No additional symptoms since. Urine sample provided: No  Birth control: none Last mammogram: 01/10/24 Density B; Bi-Rads 4 Suspicious Sexually active: No   OB History  No obstetric history on file.   Past Medical History:  Diagnosis Date   Acid reflux    just occasionally   Arthritis    at the base of my spine   Gastritis    Hyperlipidemia    Hypertension    Pancreatitis    Past Surgical History:  Procedure Laterality Date   BREAST BIOPSY Left 2000   BREAST BIOPSY Left 01/15/2024   MM LT BREAST BX W LOC DEV 1ST LESION IMAGE BX SPEC STEREO GUIDE 01/15/2024 [facility]   CATARACT EXTRACTION, BILATERAL     fractured tibia repair     INTERCOSTAL NERVE BLOCK Right 09/28/2019   Procedure: Intercostal Nerve Block;  Surgeon: Shyrl Linnie KIDD, MD;  Location: [facility];  Service: Thoracic;  Laterality: Right;   NODE DISSECTION Right 09/28/2019   Procedure: Node Dissection;  Surgeon: Shyrl Linnie KIDD, MD;  Location: [facility];  Service: Thoracic;  Laterality: Right;   RHINOPLASTY     VIDEO BRONCHOSCOPY N/A 09/28/2019   Procedure: VIDEO BRONCHOSCOPY;  Surgeon: Shyrl Linnie KIDD, MD;  Location: [facility];  Service: Thoracic;  Laterality: N/A;   Current Outpatient Medications on File Prior to Visit  Medication Sig Dispense Refill   Apoaequorin (PREVAGEN PO) Take 1 tablet by mouth daily.      Cholecalciferol (VITAMIN D) 50 MCG (2000 UT) tablet Take 1 tablet by mouth daily.     co-enzyme Q-10 50 MG capsule Take 50 mg by mouth daily.     Krill Oil (OMEGA-3) 500 MG CAPS Take by mouth.     metoprolol  succinate (TOPROL -XL) 25 MG 24 hr tablet  Take 1 tablet (25 mg total) by mouth daily. 90 tablet 3   milk thistle 175 MG tablet Take 175 mg by mouth daily.      Multiple Vitamins-Minerals (HAIR SKIN AND NAILS FORMULA PO) Take 1 Capful by mouth daily at 2 PM.     neomycin-polymyxin b-dexamethasone  (MAXITROL) 3.5-10000-0.1 SUSP Place 1 drop into both eyes every 6 (six) hours.     rosuvastatin  (CRESTOR ) 20 MG tablet Take 1 tablet (20 mg total) by mouth at bedtime. 90 tablet 3   Saccharomyces boulardii (PROBIOTIC) 250 MG CAPS Take 1 capsule by mouth in the morning and at bedtime. 60 capsule 0   valsartan  (DIOVAN ) 80 MG tablet Take 1 tablet (80 mg total) by mouth at bedtime. 90 tablet 3   vitamin E 180 MG (400 UNITS) capsule Take 400 Units by mouth daily.     metoprolol  tartrate (LOPRESSOR ) 100 MG tablet Take 1 tablet (100 mg total) by mouth once for 1 dose. Take 90-120 minutes prior to scan. Hold for SBP less than 110. (Patient not taking: Reported on 02/21/2024) 1 tablet 0   No current facility-administered medications on file prior to visit.   Allergies  Allergen Reactions   Pravachol [Pravastatin] Other (See Comments)    Thought it affected her memory and thinking   Prednisone Other (See Comments)  Pt. Stated it made me crazy      PE Today's Vitals   02/21/24 1057  BP: 102/64  Pulse: 68  Temp: 98 F (36.7 C)  TempSrc: Oral  SpO2: 99%  Weight: 117 lb (53.1 kg)  Height: 5' 3.25 (1.607 m)   Body mass index is 20.56 kg/m.  Physical Exam Vitals reviewed. Exam conducted with a chaperone present.  Constitutional:      General: She is not in acute distress.    Appearance: Normal appearance.  HENT:     Head: Normocephalic and atraumatic.     Nose: Nose normal.  Eyes:     Extraocular Movements: Extraocular movements intact.     Conjunctiva/sclera: Conjunctivae normal.  Pulmonary:     Effort: Pulmonary effort is normal.  Genitourinary:    General: Normal vulva.     Exam position: Lithotomy position.     Vagina:  Normal. No vaginal discharge.     Cervix: Normal. No cervical motion tenderness, discharge or lesion.     Uterus: Normal. Not enlarged and not tender.      Adnexa: Right adnexa normal and left adnexa normal.  Musculoskeletal:        General: Normal range of motion.     Cervical back: Normal range of motion.  Neurological:     General: No focal deficit present.     Mental Status: She is alert.  Psychiatric:        Mood and Affect: Mood normal.        Behavior: Behavior normal.      Assessment and Plan:        PMB (postmenopausal bleeding) -     Endometrial biopsy; Future -     US  PELVIS TRANSVAGINAL NON-OB (TV ONLY); Future  Cervical cancer screening -     Cytology - PAP  Patient reporting dark urine and stool. Vaginal bleeding was noted by PCP during exam. Return office for ultrasound +/- EMB (preferred to hysteroscopy per patient). I would also recommend GI and possible urology evaluation to be arranged by PCP.   Vera LULLA Pa, MD

## 2024-02-21 ENCOUNTER — Encounter: Payer: Self-pay | Admitting: Obstetrics and Gynecology

## 2024-02-21 ENCOUNTER — Ambulatory Visit (INDEPENDENT_AMBULATORY_CARE_PROVIDER_SITE_OTHER): Admitting: Obstetrics and Gynecology

## 2024-02-21 ENCOUNTER — Other Ambulatory Visit (HOSPITAL_COMMUNITY)
Admission: RE | Admit: 2024-02-21 | Discharge: 2024-02-21 | Disposition: A | Discharge status: Home / Self Care | Source: Ambulatory Visit | Attending: Obstetrics and Gynecology | Admitting: Obstetrics and Gynecology

## 2024-02-21 VITALS — BP 102/64 | HR 68 | Temp 98.0°F | Ht 63.25 in | Wt 117.0 lb

## 2024-02-21 DIAGNOSIS — N950 Postmenopausal bleeding: Secondary | ICD-10-CM | POA: Diagnosis not present

## 2024-02-21 DIAGNOSIS — Z124 Encounter for screening for malignant neoplasm of cervix: Secondary | ICD-10-CM

## 2024-02-21 DIAGNOSIS — Z1151 Encounter for screening for human papillomavirus (HPV): Secondary | ICD-10-CM | POA: Diagnosis not present

## 2024-02-21 DIAGNOSIS — Z01419 Encounter for gynecological examination (general) (routine) without abnormal findings: Secondary | ICD-10-CM | POA: Diagnosis present

## 2024-02-26 LAB — CYTOLOGY - PAP
Comment: NEGATIVE
Diagnosis: NEGATIVE
High risk HPV: NEGATIVE

## 2024-02-27 ENCOUNTER — Ambulatory Visit: Payer: Self-pay | Admitting: Obstetrics and Gynecology

## 2024-03-03 ENCOUNTER — Other Ambulatory Visit (HOSPITAL_COMMUNITY): Payer: Self-pay | Admitting: Internal Medicine

## 2024-03-03 ENCOUNTER — Encounter (HOSPITAL_COMMUNITY)

## 2024-03-10 DIAGNOSIS — G3184 Mild cognitive impairment, so stated: Secondary | ICD-10-CM | POA: Diagnosis not present

## 2024-03-10 DIAGNOSIS — R300 Dysuria: Secondary | ICD-10-CM | POA: Diagnosis not present

## 2024-03-10 DIAGNOSIS — N939 Abnormal uterine and vaginal bleeding, unspecified: Secondary | ICD-10-CM | POA: Diagnosis not present

## 2024-03-12 ENCOUNTER — Ambulatory Visit (HOSPITAL_COMMUNITY)
Admission: RE | Admit: 2024-03-12 | Discharge: 2024-03-12 | Disposition: A | Discharge status: Home / Self Care | Source: Ambulatory Visit | Attending: Internal Medicine | Admitting: Internal Medicine

## 2024-03-12 VITALS — BP 144/75 | HR 60 | Temp 97.8°F | Resp 17

## 2024-03-12 DIAGNOSIS — M8000XD Age-related osteoporosis with current pathological fracture, unspecified site, subsequent encounter for fracture with routine healing: Secondary | ICD-10-CM | POA: Diagnosis not present

## 2024-03-12 MED ORDER — ZOLEDRONIC ACID 5 MG/100ML IV SOLN
INTRAVENOUS | Status: AC
  Filled 2024-03-12: qty 100

## 2024-03-12 MED ORDER — ZOLEDRONIC ACID 5 MG/100ML IV SOLN
5.0000 mg | Freq: Once | INTRAVENOUS | Status: AC
  Administered 2024-03-12: 5 mg via INTRAVENOUS

## 2024-03-12 MED ORDER — SODIUM CHLORIDE 0.9 % IV SOLN: INTRAVENOUS | Status: DC

## 2024-03-19 ENCOUNTER — Other Ambulatory Visit: Admitting: Obstetrics and Gynecology

## 2024-03-19 ENCOUNTER — Ambulatory Visit: Admitting: Obstetrics and Gynecology

## 2024-03-19 ENCOUNTER — Other Ambulatory Visit

## 2024-03-24 ENCOUNTER — Ambulatory Visit (INDEPENDENT_AMBULATORY_CARE_PROVIDER_SITE_OTHER): Admitting: Obstetrics and Gynecology

## 2024-03-24 ENCOUNTER — Encounter: Payer: Self-pay | Admitting: Obstetrics and Gynecology

## 2024-03-24 ENCOUNTER — Ambulatory Visit (INDEPENDENT_AMBULATORY_CARE_PROVIDER_SITE_OTHER)

## 2024-03-24 ENCOUNTER — Other Ambulatory Visit: Admitting: Obstetrics and Gynecology

## 2024-03-24 ENCOUNTER — Ambulatory Visit: Admitting: Obstetrics and Gynecology

## 2024-03-24 VITALS — BP 116/68 | HR 56 | Temp 97.7°F | Wt 117.0 lb

## 2024-03-24 DIAGNOSIS — N950 Postmenopausal bleeding: Secondary | ICD-10-CM

## 2024-03-24 DIAGNOSIS — R300 Dysuria: Secondary | ICD-10-CM

## 2024-03-24 DIAGNOSIS — R3915 Urgency of urination: Secondary | ICD-10-CM | POA: Diagnosis not present

## 2024-03-24 NOTE — Progress Notes (Signed)
 78 y.o. postmenopausal female here for f/u PMB. Widowed.  No LMP recorded. Patient is postmenopausal.   She was evaluated by PCP 02/10/24 for dysuria and blood was noted in vaginal vault. She reports she woke up to use the restroom on 02/10/24 and noticed her urine was black. Had loose stool and it was black in color. No pain. Only happened once. No additional symptoms since.  Today, she reports reports urinary pain x3wk, taking AZO. Using aquaphor inside vagina for dryness which helps. No discharge or vaginal itching. No further bleeding or dark urine or stool.  Last mammogram: 01/10/24 Density B; Bi-Rads 4 Suspicious Sexually active: No   OB History  No obstetric history on file.   Past Medical History:  Diagnosis Date   Acid reflux    just occasionally   Arthritis    at the base of my spine   Gastritis    Hyperlipidemia    Hypertension    Pancreatitis    Past Surgical History:  Procedure Laterality Date   BREAST BIOPSY Left 2000   BREAST BIOPSY Left 01/15/2024   MM LT BREAST BX W LOC DEV 1ST LESION IMAGE BX SPEC STEREO GUIDE 01/15/2024 [facility]   CATARACT EXTRACTION, BILATERAL     fractured tibia repair     INTERCOSTAL NERVE BLOCK Right 09/28/2019   Procedure: Intercostal Nerve Block;  Surgeon: Shyrl Linnie KIDD, MD;  Location: [facility] OR;  Service: Thoracic;  Laterality: Right;   NODE DISSECTION Right 09/28/2019   Procedure: Node Dissection;  Surgeon: Shyrl Linnie KIDD, MD;  Location: [facility];  Service: Thoracic;  Laterality: Right;   RHINOPLASTY     VIDEO BRONCHOSCOPY N/A 09/28/2019   Procedure: VIDEO BRONCHOSCOPY;  Surgeon: Shyrl Linnie KIDD, MD;  Location: [facility];  Service: Thoracic;  Laterality: N/A;   Current Outpatient Medications on File Prior to Visit  Medication Sig Dispense Refill   Apoaequorin (PREVAGEN PO) Take 1 tablet by mouth daily.      Cholecalciferol (VITAMIN D) 50 MCG (2000 UT) tablet Take 1 tablet by mouth daily.     co-enzyme Q-10 50 MG  capsule Take 50 mg by mouth daily.     Krill Oil (OMEGA-3) 500 MG CAPS Take by mouth.     metoprolol  succinate (TOPROL -XL) 25 MG 24 hr tablet Take 1 tablet (25 mg total) by mouth daily. 90 tablet 3   metoprolol  tartrate (LOPRESSOR ) 100 MG tablet Take 1 tablet (100 mg total) by mouth once for 1 dose. Take 90-120 minutes prior to scan. Hold for SBP less than 110. 1 tablet 0   milk thistle 175 MG tablet Take 175 mg by mouth daily.      Multiple Vitamins-Minerals (HAIR SKIN AND NAILS FORMULA PO) Take 1 Capful by mouth daily at 2 PM.     neomycin-polymyxin b-dexamethasone  (MAXITROL) 3.5-10000-0.1 SUSP Place 1 drop into both eyes every 6 (six) hours.     rosuvastatin  (CRESTOR ) 20 MG tablet Take 1 tablet (20 mg total) by mouth at bedtime. 90 tablet 3   Saccharomyces boulardii (PROBIOTIC) 250 MG CAPS Take 1 capsule by mouth in the morning and at bedtime. 60 capsule 0   valsartan  (DIOVAN ) 80 MG tablet Take 1 tablet (80 mg total) by mouth at bedtime. 90 tablet 3   vitamin E 180 MG (400 UNITS) capsule Take 400 Units by mouth daily.     No current facility-administered medications on file prior to visit.   Allergies  Allergen Reactions   Pravachol [Pravastatin] Other (See  Comments)    Thought it affected her memory and thinking   Prednisone Other (See Comments)    Pt. Stated it made me crazy      PE Today's Vitals   03/24/24 1111  BP: 116/68  Pulse: (!) 56  Temp: 97.7 F (36.5 C)  TempSrc: Oral  SpO2: 98%  Weight: 117 lb (53.1 kg)   Body mass index is 20.56 kg/m.  Physical Exam Vitals reviewed.  Constitutional:      General: She is not in acute distress.    Appearance: Normal appearance.  HENT:     Head: Normocephalic and atraumatic.     Nose: Nose normal.  Eyes:     Extraocular Movements: Extraocular movements intact.     Conjunctiva/sclera: Conjunctivae normal.  Pulmonary:     Effort: Pulmonary effort is normal.  Musculoskeletal:        General: Normal range of motion.      Cervical back: Normal range of motion.  Neurological:     General: No focal deficit present.     Mental Status: She is alert.  Psychiatric:        Mood and Affect: Mood normal.        Behavior: Behavior normal.     03/24/24 TVUS: Indications: Postmenopausal bleeding  Findings:   Uterus: 6.2 x 3.6 x 2.5 cm, anteverted uterus. Endometrial thickness: 2 mm.  Trace hypoechoic fluid present within the endometrium.  No vascularity noted. Left ovary: Not visualized. Right ovary: 1.8 x 0.8 x 1.2 cm, normal-appearing. No free fluid.  Impression:  Normal transvaginal ultrasound  Vera LULLA Pa, MD   Assessment and Plan:        PMB (postmenopausal bleeding) Patient reporting dark urine and stool. Vaginal bleeding was noted by PCP during exam. TVUS with normal endometrial thickness, no EMB indicated at this time. I would recommend GI and possible urology evaluation to be arranged by PCP.  Dysuria -     Urinalysis,Complete w/RFL Culture   Vera LULLA Pa, MD

## 2024-03-26 LAB — URINALYSIS, COMPLETE W/RFL CULTURE
Bilirubin Urine: NEGATIVE
Glucose, UA: NEGATIVE
Hyaline Cast: NONE SEEN /LPF
Ketones, ur: NEGATIVE
Leukocyte Esterase: NEGATIVE
Nitrites, Initial: NEGATIVE
RBC / HPF: NONE SEEN /HPF (ref 0–2)
Specific Gravity, Urine: 1.01 (ref 1.001–1.035)
pH: 5.5 (ref 5.0–8.0)

## 2024-03-26 LAB — URINE CULTURE
MICRO NUMBER:: 17035599
Result:: NO GROWTH
SPECIMEN QUALITY:: ADEQUATE

## 2024-03-26 LAB — CULTURE INDICATED

## 2024-03-27 ENCOUNTER — Ambulatory Visit: Payer: Self-pay | Admitting: Obstetrics and Gynecology

## 2024-04-06 DIAGNOSIS — Z1211 Encounter for screening for malignant neoplasm of colon: Secondary | ICD-10-CM | POA: Diagnosis not present

## 2024-04-07 ENCOUNTER — Telehealth: Payer: Self-pay

## 2024-04-07 NOTE — Telephone Encounter (Signed)
 She needs an appt to discuss. I also received a staff msg from [place] about this today.

## 2024-04-07 NOTE — Telephone Encounter (Signed)
 Pt called and left a voice message requesting vaginal estrogen for her vaginal problems. Pt stated that the pain comes and goes and this medication would help a lot. I could not distinguish which medication Pt was referring to, when looking at her med list.  Called Pt back to get the exact name of the prescription she is requesting. No answer. Left vm for Pt to call us  back.

## 2024-04-07 NOTE — Telephone Encounter (Signed)
 03/24/24 OV notes reviewed. Patient reported she was using Aquaphor for vaginal dryness, was helping.   Routing to Dr. Dallie to advise on vaginal estrogen cream

## 2024-04-07 NOTE — Telephone Encounter (Signed)
 See MyChart encounter dated 04/07/24.   Encounter closed.

## 2024-04-08 NOTE — Telephone Encounter (Signed)
 Pt has been scheduled for an office visit with Dr. Dallie on 05/04/24.

## 2024-04-09 ENCOUNTER — Ambulatory Visit
Admission: EM | Admit: 2024-04-09 | Discharge: 2024-04-09 | Disposition: A | Discharge status: Home / Self Care | Attending: Family Medicine | Admitting: Family Medicine

## 2024-04-09 DIAGNOSIS — L089 Local infection of the skin and subcutaneous tissue, unspecified: Secondary | ICD-10-CM | POA: Insufficient documentation

## 2024-04-09 DIAGNOSIS — T148XXA Other injury of unspecified body region, initial encounter: Secondary | ICD-10-CM | POA: Insufficient documentation

## 2024-04-09 DIAGNOSIS — N3090 Cystitis, unspecified without hematuria: Secondary | ICD-10-CM | POA: Diagnosis present

## 2024-04-09 DIAGNOSIS — R413 Other amnesia: Secondary | ICD-10-CM | POA: Insufficient documentation

## 2024-04-09 DIAGNOSIS — G3184 Mild cognitive impairment, so stated: Secondary | ICD-10-CM | POA: Insufficient documentation

## 2024-04-09 DIAGNOSIS — N939 Abnormal uterine and vaginal bleeding, unspecified: Secondary | ICD-10-CM | POA: Insufficient documentation

## 2024-04-09 LAB — POCT URINE DIPSTICK
Bilirubin, UA: NEGATIVE
Glucose, UA: NEGATIVE mg/dL
Ketones, POC UA: NEGATIVE mg/dL
Nitrite, UA: NEGATIVE
Spec Grav, UA: 1.02 (ref 1.010–1.025)
Urobilinogen, UA: 0.2 U/dL
pH, UA: 5.5 (ref 5.0–8.0)

## 2024-04-09 MED ORDER — CEPHALEXIN 250 MG PO CAPS: 250.0000 mg | ORAL_CAPSULE | Freq: Three times a day (TID) | ORAL | 0 refills | Status: DC

## 2024-04-09 NOTE — Discharge Instructions (Addendum)
 The urinalysis had some white blood cells and a little red blood cells, which could be signs of a bladder infection.  Cephalexin  500 mg --1 tablet by mouth 2 times daily for 7 days.  Urine culture is sent, and staff will notify you that it looks like the antibiotic needs to be changed.  Please keep your follow-up with gynecology

## 2024-04-09 NOTE — ED Triage Notes (Signed)
 Patient reports that these symptoms began with recurrent UTIs. Her primary care provider has advised her to see a gynecologist for these recurring episodes. She is currently experiencing dysuria and urinary frequency/urgency, but no fever or abdominal pain.  Additionally, she scratched her right ankle with a branch and would like it examined

## 2024-04-09 NOTE — ED Provider Notes (Signed)
 [facility]    CSN: [id number] Arrival date & time: 04/09/24  0803      History   Chief Complaint Chief Complaint  Patient presents with   UTI Symptoms    HPI Tina Erickson is a 78 y.o. female.   HPI Here for dysuria and urinary frequency and urge.  She states she has had the symptoms for about a month.  She saw gynecology when her primary care felt that her symptoms were not due to a bladder infection.  Urinalysis was negative at that time.  It is hard to discern from her history but it sounds like the symptoms may be worsened in the last 24 hours.  No fever or chills and no nausea or vomiting.  No blood in the urine  She is allergic to pravastatin and prednisone  She also has a scratch on her right lower leg that was sustained a couple of weeks ago and is not healing.  Past Medical History:  Diagnosis Date   Acid reflux    just occasionally   Arthritis    at the base of my spine   Gastritis    Hyperlipidemia    Hypertension    Pancreatitis     Patient Active Problem List   Diagnosis Date Noted   Vaginal bleeding 04/09/2024   MCI (mild cognitive impairment) 04/09/2024   Amnesia 04/09/2024   PMB (postmenopausal bleeding) 02/21/2024   Collapsed vertebra, not elsewhere classified, cervical region, subsequent encounter for fracture with routine healing 11/26/2023   Age-related cognitive decline 11/26/2023   Memory change 11/26/2023   Closed odontoid fracture (HCC) 11/26/2023   Arthralgia of left knee 05/30/2023   Preop cardiovascular exam 05/20/2023   Lung nodules 05/01/2023   Paroxysmal atrial tachycardia 10/16/2022   Syncope and collapse 09/19/2020   Premature atrial contraction 09/19/2020   Grief reaction 03/30/2020   Hereditary disorder of nervous system 12/31/2019   Constipation 11/13/2019   Incontinence of feces 11/13/2019   Abscess of middle lobe of right lung without pneumonia (HCC) 09/28/2019   Tinnitus of both ears 09/24/2019    Impacted cerumen 09/21/2019   Tinnitus 09/21/2019   Anemia 05/11/2019   Lung field abnormal 01/12/2019   Essential hypertension 12/31/2018   Precordial pain 11/19/2018   Paresthesia 11/19/2018   Mixed hyperlipidemia 11/19/2018   Family history of early CAD 11/19/2018   Pancreatitis    Gastritis    Acute pancreatitis 06/20/2018   Gastro-esophageal reflux disease without esophagitis 06/16/2018   Encounter for screening for other disorder 04/28/2015   Encounter for general adult medical examination without abnormal findings 04/15/2015   Varicose veins of unspecified lower extremity with inflammation 03/21/2015   Idiopathic scoliosis 04/16/2014   Abnormal results of liver function studies 03/25/2013   Urinary incontinence 03/25/2013   Osteoporosis with current pathological fracture with routine healing 04/01/2012   Age-related osteoporosis without current pathological fracture 04/01/2012   Hyperlipidemia 02/11/2012    Past Surgical History:  Procedure Laterality Date   BREAST BIOPSY Left 2000   BREAST BIOPSY Left 01/15/2024   MM LT BREAST BX W LOC DEV 1ST LESION IMAGE BX SPEC STEREO GUIDE 01/15/2024 [facility]   CATARACT EXTRACTION, BILATERAL     fractured tibia repair     INTERCOSTAL NERVE BLOCK Right 09/28/2019   Procedure: Intercostal Nerve Block;  Surgeon: Shyrl Linnie KIDD, MD;  Location: [facility];  Service: Thoracic;  Laterality: Right;   NODE DISSECTION Right 09/28/2019   Procedure: Node Dissection;  Surgeon: Shyrl Linnie  O, MD;  Location: [facility];  Service: Thoracic;  Laterality: Right;   RHINOPLASTY     VIDEO BRONCHOSCOPY N/A 09/28/2019   Procedure: VIDEO BRONCHOSCOPY;  Surgeon: Shyrl Linnie KIDD, MD;  Location: [facility];  Service: Thoracic;  Laterality: N/A;    OB History   No obstetric history on file.      Home Medications    Prior to Admission medications   Medication Sig Start Date End Date Taking? Authorizing Provider  cephALEXin  (KEFLEX ) 250 MG  capsule Take 1 capsule (250 mg total) by mouth 3 (three) times daily for 7 days. 04/09/24 04/16/24 Yes Vonna Sharlet POUR, MD  pantoprazole  (PROTONIX ) 40 MG tablet Take 40 mg by mouth 2 (two) times daily. 02/14/24  Yes [provider]  Apoaequorin (PREVAGEN PO) Take 1 tablet by mouth daily.     [provider]  Cholecalciferol (VITAMIN D) 50 MCG (2000 UT) tablet Take 1 tablet by mouth daily. 03/25/13   [provider]  co-enzyme Q-10 50 MG capsule Take 50 mg by mouth daily.    [provider]  Anselm Frizzle (OMEGA-3) 500 MG CAPS Take by mouth. 09/24/19   [provider]  metoprolol  succinate (TOPROL -XL) 25 MG 24 hr tablet Take 1 tablet (25 mg total) by mouth daily. 05/20/23   Patwardhan, Newman PARAS, MD  metoprolol  tartrate (LOPRESSOR ) 100 MG tablet Take 1 tablet (100 mg total) by mouth once for 1 dose. Take 90-120 minutes prior to scan. Hold for SBP less than 110. 11/01/23 03/24/24  Patwardhan, Manish J, MD  milk thistle 175 MG tablet Take 175 mg by mouth daily.     [provider]  Multiple Vitamins-Minerals (HAIR SKIN AND NAILS FORMULA PO) Take 1 Capful by mouth daily at 2 PM.    [provider]  neomycin-polymyxin b-dexamethasone  (MAXITROL) 3.5-10000-0.1 SUSP Place 1 drop into both eyes every 6 (six) hours. 11/04/23   [provider]  rosuvastatin  (CRESTOR ) 20 MG tablet Take 1 tablet (20 mg total) by mouth at bedtime. 05/20/23   Patwardhan, Newman PARAS, MD  Saccharomyces boulardii (PROBIOTIC) 250 MG CAPS Take 1 capsule by mouth in the morning and at bedtime. 10/09/19   Hope Almarie ORN, NP  valsartan  (DIOVAN ) 80 MG tablet Take 1 tablet (80 mg total) by mouth at bedtime. 05/20/23   Patwardhan, Newman PARAS, MD  vitamin E 180 MG (400 UNITS) capsule Take 400 Units by mouth daily.    [provider]    Family History Family History  Problem Relation Age of Onset   Hernia Mother    Migraines Mother    Ulcers Mother    Heart failure  Father    Ulcers Father    Breast cancer Sister 35   Migraines Sister    Obesity Sister     Social History Social History   Tobacco Use   Smoking status: Never   Smokeless tobacco: Never  Vaping Use   Vaping status: Never Used  Substance Use Topics   Alcohol  use: Yes    Comment: 2 per week   Drug use: No     Allergies   Pravachol [pravastatin] and Prednisone   Review of Systems Review of Systems   Physical Exam Triage Vital Signs ED Triage Vitals  Encounter Vitals Group     BP 04/09/24 0840 (!) 171/84     Girls Systolic BP Percentile --      Girls Diastolic BP Percentile --      Boys Systolic BP Percentile --  Boys Diastolic BP Percentile --      Pulse Rate 04/09/24 0840 63     Resp 04/09/24 0840 18     Temp 04/09/24 0840 (!) 97.5 F (36.4 C)     Temp Source 04/09/24 0840 Oral     SpO2 04/09/24 0840 97 %     Weight 04/09/24 0839 117 lb 1 oz (53.1 kg)     Height 04/09/24 0839 5' 3.25 (1.607 m)     Head Circumference --      Peak Flow --      Pain Score 04/09/24 0831 0     Pain Loc --      Pain Education --      Exclude from Growth Chart --    No data found.  Updated Vital Signs BP (!) 171/84 (BP Location: Right Arm)   Pulse 63   Temp (!) 97.5 F (36.4 C) (Oral)   Resp 18   Ht 5' 3.25 (1.607 m)   Wt 53.1 kg   SpO2 97%   BMI 20.57 kg/m   Visual Acuity Right Eye Distance:   Left Eye Distance:   Bilateral Distance:    Right Eye Near:   Left Eye Near:    Bilateral Near:     Physical Exam Vitals reviewed.  Constitutional:      General: She is not in acute distress.    Appearance: She is not ill-appearing, toxic-appearing or diaphoretic.  HENT:     Mouth/Throat:     Mouth: Mucous membranes are moist.  Cardiovascular:     Rate and Rhythm: Normal rate and regular rhythm.  Pulmonary:     Effort: Pulmonary effort is normal.     Breath sounds: Normal breath sounds.  Abdominal:     Palpations: Abdomen is soft.     Tenderness: There  is no abdominal tenderness.  Skin:    Coloration: Skin is not pale.     Comments: She has an area of mild erythema about 1.5 cm in diameter on her right ankle anteriorly.  There is no drainage.  Not much induration.  The center is an abrasion that is still open.  Neurological:     Mental Status: She is alert and oriented to person, place, and time.  Psychiatric:        Behavior: Behavior normal.      UC Treatments / Results  Labs (all labs ordered are listed, but only abnormal results are displayed) Labs Reviewed  POCT URINE DIPSTICK - Abnormal; Notable for the following components:      Result Value   Color, UA light yellow (*)    Clarity, UA cloudy (*)    Blood, UA trace-intact (*)    Leukocytes, UA Small (1+) (*)    All other components within normal limits  URINE CULTURE    EKG   Radiology No results found.  Procedures Procedures (including critical care time)  Medications Ordered in UC Medications - No data to display  Initial Impression / Assessment and Plan / UC Course  I have reviewed the triage vital signs and the nursing notes.  Pertinent labs & imaging results that were available during my care of the patient were reviewed by me and considered in my medical decision making (see chart for details).     Today her urinalysis shows a small amount of white blood cells and a trace of red blood cells  Keflex  is sent in to cover for possible wound infection and for possible UTI.  Urine culture is sent.  I have discussed with the patient that this still may not be a UTI.  I have asked her to please keep her follow-up appointment with OB/GYN to discuss further options for evaluation and/or treatment.  Final Clinical Impressions(s) / UC Diagnoses   Final diagnoses:  Cystitis  Post-traumatic wound infection     Discharge Instructions      The urinalysis had some white blood cells and a little red blood cells, which could be signs of a bladder  infection.  Cephalexin  500 mg --1 tablet by mouth 2 times daily for 7 days.  Urine culture is sent, and staff will notify you that it looks like the antibiotic needs to be changed.  Please keep your follow-up with gynecology     ED Prescriptions     Medication Sig Dispense Auth. Provider   cephALEXin  (KEFLEX ) 250 MG capsule Take 1 capsule (250 mg total) by mouth 3 (three) times daily for 7 days. 21 capsule Birttany Dechellis K, MD      PDMP not reviewed this encounter.   Vonna Sharlet POUR, MD 04/09/24 1130

## 2024-04-10 ENCOUNTER — Telehealth: Payer: Self-pay | Admitting: Nurse Practitioner

## 2024-04-10 MED ORDER — CEPHALEXIN 500 MG PO CAPS: 500.0000 mg | ORAL_CAPSULE | Freq: Three times a day (TID) | ORAL | 0 refills | Status: AC

## 2024-04-10 NOTE — Telephone Encounter (Signed)
 Patient returned to the clinic with questions regarding the dosing instructions for the antibiotic prescribed during her visit on 04/09/24. Her after-visit summary indicates Keflex  500 mg twice daily for 7 days; however, the pharmacy dispensed Keflex  250 mg capsules with instructions to take one capsule three times daily for 7 days (21 capsules total). The patient expressed concern about the discrepancy and requested clarification and a new prescription to ensure proper dosing. She also asked that the medication she received from the pharmacy be disposed of to avoid confusion. A new prescription for Keflex  500 mg to be taken three times daily for 7 days was sent to her pharmacy.

## 2024-04-11 LAB — URINE CULTURE: Culture: 20000 — AB

## 2024-04-13 ENCOUNTER — Ambulatory Visit (HOSPITAL_COMMUNITY): Payer: Self-pay | Admitting: Family Medicine

## 2024-04-14 ENCOUNTER — Other Ambulatory Visit: Payer: Self-pay | Admitting: Cardiology

## 2024-04-14 ENCOUNTER — Encounter: Payer: Self-pay | Admitting: Obstetrics and Gynecology

## 2024-04-14 DIAGNOSIS — I491 Atrial premature depolarization: Secondary | ICD-10-CM

## 2024-04-14 NOTE — Telephone Encounter (Signed)
 Urgent care nurse Alfonso Bailey sent a message that she would contact the patient regarding this since they saw her for that visit.

## 2024-04-15 NOTE — Progress Notes (Unsigned)
 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 Originally seen by Dr Kassie 02/10/2019 after w/u for epigastric pain, CT abd noted RML tree-in -bud. Subsequent concern of R infrahilar mass. Empiric Augmentin  caused GI distress.  Bronchoscopy, R thoracoscopy, RML lobectomy, lymph node sampling 09/29/19- Dr Shyrl Path- Granulomatous inflammation, mononuclear cells, Aerobic/Anaerobic CX> rare Propionibacterium sp. AFB cultures not done. Differential diagnosis for granulomatous inflammation primarily includes  infection and sarcoidosis.  AFB and GMS special stains are negative for  acid-fast bacilli and fungal organisms respectively. Immunohistochemical  stain for synaptophysin highlights the carcinoid tumorlet.   CT Abdomen 11/13/18-mild RML tree in bud opacity CT Chest 01/09/19- scattered focal areas of tree-in-bud opacities in lower lobes bilaterally. RML bronchocele. Posterior opacity in RML, 3mm RLL nodule: recommended follow-up Labs  2021- ACE 9, ANA Neg, ANCA Neg CT chest 10/27/19 PET 12/14/19-mild focal accentuated activity with maximum SUV 3.6 which could be postoperative 2. Mild tree-in-bud nodularity in the left lower lobe Quantiferon TB Gold 03/30/20- Neg -------------------------------------------------------------------------------   04/11/23-  76 yoF never smoker followed for Granulomatous Inflammation/ Abscess, Lung Nodules addressed with RML lobectomy, neg for CA, neg cultures.Complicated by HTN, Pancreatitis, Gastritis, Hyperlipidemia, PAFib,  Bronchoscopy, R thoracoscopy, RML lobectomy, lymph node sampling 09/29/19- Dr Shyrl Path- Granulomatous inflammation (2021), mononuclear cells, Aerobic/Anaerobic CX> rare Propionibacterium sp. AFB cultures not done Flu vax senior today -----Breathing has been good  She denies cough wheeze or unusual dyspnea and feels stable.  Last CT scan discussed, showing  stable lung nodules likely benign. 2 sisters have died with cancers. CT chest May 04, 2022-  IMPRESSION: 1. Stable exam. No new or progressive findings. 2. Status post right middle lobectomy. 3. Stable bilateral pulmonary nodules measuring up to 9 mm. Given the interval stability for more than 2 years, these are considered most likely benign. 4.  Aortic Atherosclerosis (ICD10-I70.0).  04/15/24- 77 yoF never smoker followed for Granulomatous Inflammation/ Abscess, Lung Nodules addressed with RML lobectomy, neg for CA, neg cultures.Complicated by HTN, Pancreatitis, Gastritis, Hyperlipidemia, PAFib,  Bronchoscopy, R thoracoscopy, RML lobectomy, lymph node sampling 09/29/19- Dr Shyrl Path- Granulomatous inflammation (2021), mononuclear cells, Aerobic/Anaerobic CX> rare Propionibacterium sp. AFB cultures not done Discussed the use of AI scribe software for clinical note transcription with the patient, who gave verbal consent to proceed.  History of Present Illness   Tina Erickson is a 78 year old female who presents for f/u of granulomatous lung disease.  She is on a cephalosporin for a urinary tract infection. [facility] will [facility] before planned trip to [place] . Otherwise her chest is doing well. She reports no current issues with coughing and her stomach is doing well.      CT chest 11/17/23 IMPRESSION: 1. Stable exam. Surgical changes again noted right lung. No new or progressive findings. 2. Scattered bilateral pulmonary nodules are stable in the interval, consistent with benign etiology. 3.  Aortic Atherosclerosis (ICD10-I70.0).  Assessment and Plan:    Granulomatous lung disease Stable as of last CT, and asymptomatic -Continue surveillance for now.   Urinary tract infection Confirmed UTI with Enterococcus  broadly antibiotic-sensitive. Symptoms improved with Cefalexin, sleep disturbances persist. Two days of antibiotics remain, travel timing concern noted. -  Advise return to urgent care if symptoms persist by Friday. - Discuss with urgent care potential antibiotic course extension if needed. - Advised against cranberry juice use for UTI treatment.     ROS-see HPI   + = positive  Constitutional:   + weight loss, night sweats, fevers, chills, fatigue, lassitude. HEENT:    headaches, difficulty swallowing, tooth/dental problems, sore throat,       sneezing, itching, ear ache, nasal congestion, post nasal drip, snoring CV:   + post VATS chest pain, orthopnea, PND, swelling in lower extremities, anasarca,                                   dizziness, palpitations Resp:   shortness of breath with exertion or at rest.                productive cough,   +non-productive cough, coughing up of blood.              change in color of mucus.  wheezing.   Skin:    rash or lesions. GI:  No-   heartburn, indigestion, abdominal pain, nausea, vomiting, diarrhea,                 change in bowel habits, loss of appetite GU: dysuria, change in color of urine, no urgency or frequency.   flank pain. MS:   +joint pain, stiffness, decreased range of motion, back pain. Neuro-     nothing unusual Psych:  change in mood or affect.  depression or anxiety.   memory loss.  OBJ- Physical Exam General- Alert, Oriented, Affect-appropriate, Distress- none acute,  + slender Skin- rash-none, lesions- none, excoriation- none Lymphadenopathy- none Head- atraumatic            Eyes- Gross vision intact, PERRLA, conjunctivae and secretions clear            Ears- Hearing, canals-normal            Nose- Clear, no-Septal dev, mucus, polyps, erosion, perforation             Throat- Mallampati II , mucosa clear , drainage- none, tonsils- atrophic Neck- flexible , trachea midline, no stridor , thyroid nl, carotid no bruit Chest - symmetrical excursion , unlabored           Heart/CV- RRR , no murmur , no gallop  , no rub, nl s1 s2                           - JVD- none , edema- none,  stasis changes- none, varices- none           Lung- clear to P&A, wheeze- none, cough- none , dullness-none, rub- none           Chest wall-  Abd-  Br/ Gen/ Rectal- Not done, not indicated Extrem- cyanosis- none, clubbing, none, atrophy- none, strength- nl Neuro- grossly intact to observation

## 2024-04-16 ENCOUNTER — Encounter: Payer: Self-pay | Admitting: Internal Medicine

## 2024-04-16 ENCOUNTER — Ambulatory Visit: Payer: Medicare HMO | Admitting: Internal Medicine

## 2024-04-16 VITALS — BP 110/78 | HR 60 | Temp 98.2°F | Ht 63.0 in | Wt 118.4 lb

## 2024-04-16 DIAGNOSIS — B952 Enterococcus as the cause of diseases classified elsewhere: Secondary | ICD-10-CM | POA: Diagnosis not present

## 2024-04-16 DIAGNOSIS — J984 Other disorders of lung: Secondary | ICD-10-CM | POA: Diagnosis not present

## 2024-04-16 DIAGNOSIS — N390 Urinary tract infection, site not specified: Secondary | ICD-10-CM

## 2024-04-16 DIAGNOSIS — J852 Abscess of lung without pneumonia: Secondary | ICD-10-CM

## 2024-04-16 NOTE — Patient Instructions (Signed)
 Glad your breathing seems to be doing well. Please call if we can help.

## 2024-04-17 ENCOUNTER — Ambulatory Visit
Admission: EM | Admit: 2024-04-17 | Discharge: 2024-04-17 | Disposition: A | Discharge status: Home / Self Care | Attending: Physician Assistant | Admitting: Physician Assistant

## 2024-04-17 ENCOUNTER — Encounter: Payer: Self-pay | Admitting: Internal Medicine

## 2024-04-17 DIAGNOSIS — Z0000 Encounter for general adult medical examination without abnormal findings: Secondary | ICD-10-CM

## 2024-04-17 LAB — POCT URINE DIPSTICK
Bilirubin, UA: NEGATIVE
Glucose, UA: NEGATIVE mg/dL
Ketones, POC UA: NEGATIVE mg/dL
Leukocytes, UA: NEGATIVE
Nitrite, UA: NEGATIVE
POC PROTEIN,UA: NEGATIVE
Spec Grav, UA: 1.015 (ref 1.010–1.025)
Urobilinogen, UA: 0.2 U/dL
pH, UA: 6.5 (ref 5.0–8.0)

## 2024-04-17 NOTE — Discharge Instructions (Addendum)
 Your urine is normal.  Make sure you drink plenty of water.  Return if any problems.

## 2024-04-17 NOTE — ED Triage Notes (Signed)
 Patient presents for follow-up after a recent possible UTI. Reports no burning, irritation, or difficulty with urination. Visit prompted by pulmonologist's recommendation to ensure infection has resolved. Patient is nearing completion of antibiotic course, with two doses remaining. Denies fever.

## 2024-04-17 NOTE — ED Provider Notes (Signed)
 [facility]    CSN: [id number] Arrival date & time: 04/17/24  0801      History   Chief Complaint Chief Complaint  Patient presents with   Follow-up    HPI Tina Erickson is a 78 y.o. female.   Pt reports her doctor sent her here to have her urine rechecked.  Pt states she feels completely better however her MD wanted to make sure infection is resolved.  Pt states she has been drinking plenty of water and thinks that help.    The history is provided by the patient. No language interpreter was used.    Past Medical History:  Diagnosis Date   Acid reflux    just occasionally   Arthritis    at the base of my spine   Gastritis    Hyperlipidemia    Hypertension    Pancreatitis     Patient Active Problem List   Diagnosis Date Noted   Vaginal bleeding 04/09/2024   MCI (mild cognitive impairment) 04/09/2024   Amnesia 04/09/2024   PMB (postmenopausal bleeding) 02/21/2024   Collapsed vertebra, not elsewhere classified, cervical region, subsequent encounter for fracture with routine healing 11/26/2023   Age-related cognitive decline 11/26/2023   Memory change 11/26/2023   Closed odontoid fracture (HCC) 11/26/2023   Arthralgia of left knee 05/30/2023   Preop cardiovascular exam 05/20/2023   Lung nodules 05/01/2023   Paroxysmal atrial tachycardia 10/16/2022   Syncope and collapse 09/19/2020   Premature atrial contraction 09/19/2020   Grief reaction 03/30/2020   Hereditary disorder of nervous system 12/31/2019   Constipation 11/13/2019   Incontinence of feces 11/13/2019   Abscess of middle lobe of right lung without pneumonia (HCC) 09/28/2019   Tinnitus of both ears 09/24/2019   Impacted cerumen 09/21/2019   Tinnitus 09/21/2019   Anemia 05/11/2019   Lung field abnormal 01/12/2019   Essential hypertension 12/31/2018   Precordial pain 11/19/2018   Paresthesia 11/19/2018   Mixed hyperlipidemia 11/19/2018   Family history of early CAD 11/19/2018    Pancreatitis    Gastritis    Acute pancreatitis 06/20/2018   Gastro-esophageal reflux disease without esophagitis 06/16/2018   Encounter for screening for other disorder 04/28/2015   Encounter for general adult medical examination without abnormal findings 04/15/2015   Varicose veins of unspecified lower extremity with inflammation 03/21/2015   Idiopathic scoliosis 04/16/2014   Abnormal results of liver function studies 03/25/2013   Urinary incontinence 03/25/2013   Osteoporosis with current pathological fracture with routine healing 04/01/2012   Age-related osteoporosis without current pathological fracture 04/01/2012   Hyperlipidemia 02/11/2012    Past Surgical History:  Procedure Laterality Date   BREAST BIOPSY Left 2000   BREAST BIOPSY Left 01/15/2024   MM LT BREAST BX W LOC DEV 1ST LESION IMAGE BX SPEC STEREO GUIDE 01/15/2024 [facility]   CATARACT EXTRACTION, BILATERAL     fractured tibia repair     INTERCOSTAL NERVE BLOCK Right 09/28/2019   Procedure: Intercostal Nerve Block;  Surgeon: Shyrl Linnie KIDD, MD;  Location: [facility] OR;  Service: Thoracic;  Laterality: Right;   NODE DISSECTION Right 09/28/2019   Procedure: Node Dissection;  Surgeon: Shyrl Linnie KIDD, MD;  Location: [facility] OR;  Service: Thoracic;  Laterality: Right;   RHINOPLASTY     VIDEO BRONCHOSCOPY N/A 09/28/2019   Procedure: VIDEO BRONCHOSCOPY;  Surgeon: Shyrl Linnie KIDD, MD;  Location: [facility] OR;  Service: Thoracic;  Laterality: N/A;    OB History   No obstetric history on file.  Home Medications    Prior to Admission medications   Medication Sig Start Date End Date Taking? Authorizing Provider  cephALEXin  (KEFLEX ) 500 MG capsule Take 1 capsule (500 mg total) by mouth 3 (three) times daily for 7 days. 04/10/24 04/17/24 Yes Murrill, Lucie, FNP  Apoaequorin (PREVAGEN PO) Take 1 tablet by mouth daily.     [provider]  Cholecalciferol (VITAMIN D) 50 MCG (2000 UT) tablet Take 1 tablet by  mouth daily. 03/25/13   [provider]  co-enzyme Q-10 50 MG capsule Take 50 mg by mouth daily.    [provider]  Anselm Frizzle (OMEGA-3) 500 MG CAPS Take by mouth. 09/24/19   [provider]  metoprolol  succinate (TOPROL -XL) 25 MG 24 hr tablet Take 1 tablet by mouth once daily 04/16/24   Patwardhan, Manish J, MD  metoprolol  tartrate (LOPRESSOR ) 100 MG tablet Take 1 tablet (100 mg total) by mouth once for 1 dose. Take 90-120 minutes prior to scan. Hold for SBP less than 110. 11/01/23 04/16/24  Patwardhan, Manish J, MD  milk thistle 175 MG tablet Take 175 mg by mouth daily.     [provider]  Multiple Vitamins-Minerals (HAIR SKIN AND NAILS FORMULA PO) Take 1 Capful by mouth daily at 2 PM.    [provider]  neomycin-polymyxin b-dexamethasone  (MAXITROL) 3.5-10000-0.1 SUSP Place 1 drop into both eyes every 6 (six) hours. 11/04/23   [provider]  pantoprazole  (PROTONIX ) 40 MG tablet Take 40 mg by mouth 2 (two) times daily. 02/14/24   [provider]  rosuvastatin  (CRESTOR ) 20 MG tablet Take 1 tablet (20 mg total) by mouth at bedtime. 05/20/23   Patwardhan, Newman PARAS, MD  Saccharomyces boulardii (PROBIOTIC) 250 MG CAPS Take 1 capsule by mouth in the morning and at bedtime. 10/09/19   Hope Almarie ORN, NP  valsartan  (DIOVAN ) 80 MG tablet Take 1 tablet (80 mg total) by mouth at bedtime. 05/20/23   Patwardhan, Newman PARAS, MD  vitamin E 180 MG (400 UNITS) capsule Take 400 Units by mouth daily.    [provider]    Family History Family History  Problem Relation Age of Onset   Hernia Mother    Migraines Mother    Ulcers Mother    Heart failure Father    Ulcers Father    Breast cancer Sister 29   Migraines Sister    Obesity Sister     Social History Social History   Tobacco Use   Smoking status: Never   Smokeless tobacco: Never  Vaping Use   Vaping status: Never Used  Substance Use Topics   Alcohol  use: Yes    Comment: 2  per week   Drug use: No     Allergies   Pravachol [pravastatin] and Prednisone   Review of Systems Review of Systems  All other systems reviewed and are negative.    Physical Exam Triage Vital Signs ED Triage Vitals  Encounter Vitals Group     BP 04/17/24 0815 103/87     Girls Systolic BP Percentile --      Girls Diastolic BP Percentile --      Boys Systolic BP Percentile --      Boys Diastolic BP Percentile --      Pulse Rate 04/17/24 0815 83     Resp 04/17/24 0815 18     Temp 04/17/24 0815 97.8 F (36.6 C)     Temp Source 04/17/24 0815 Oral     SpO2 04/17/24 0815 96 %  Weight 04/17/24 0814 118 lb 6.2 oz (53.7 kg)     Height 04/17/24 0814 5' 3 (1.6 m)     Head Circumference --      Peak Flow --      Pain Score 04/17/24 0814 0     Pain Loc --      Pain Education --      Exclude from Growth Chart --    No data found.  Updated Vital Signs BP 103/87 (BP Location: Left Arm)   Pulse 83   Temp 97.8 F (36.6 C) (Oral)   Resp 18   Ht 5' 3 (1.6 m)   Wt 53.7 kg   SpO2 96%   BMI 20.97 kg/m   Visual Acuity Right Eye Distance:   Left Eye Distance:   Bilateral Distance:    Right Eye Near:   Left Eye Near:    Bilateral Near:     Physical Exam Vitals and nursing note reviewed.  Constitutional:      Appearance: She is well-developed.  HENT:     Head: Normocephalic.  Cardiovascular:     Rate and Rhythm: Normal rate.  Pulmonary:     Effort: Pulmonary effort is normal.  Abdominal:     General: There is no distension.  Musculoskeletal:        General: Normal range of motion.  Skin:    General: Skin is warm.  Neurological:     General: No focal deficit present.     Mental Status: She is alert and oriented to person, place, and time.      UC Treatments / Results  Labs (all labs ordered are listed, but only abnormal results are displayed) Labs Reviewed  POCT URINE DIPSTICK - Abnormal; Notable for the following components:      Result Value    Color, UA light yellow (*)    Clarity, UA cloudy (*)    Blood, UA trace-lysed (*)    All other components within normal limits    EKG   Radiology No results found.  Procedures Procedures (including critical care time)  Medications Ordered in UC Medications - No data to display  Initial Impression / Assessment and Plan / UC Course  I have reviewed the triage vital signs and the nursing notes.  Pertinent labs & imaging results that were available during my care of the patient were reviewed by me and considered in my medical decision making (see chart for details).     UA no nitrates, no leukocytes.  Pt advised of results  Final Clinical Impressions(s) / UC Diagnoses   Final diagnoses:  Well adult exam     Discharge Instructions      Your urine is normal.  Make sure you drink plenty of water.  Return if any problems.    ED Prescriptions   None    PDMP not reviewed this encounter. An After Visit Summary was printed and given to the patient.       Flint Sonny POUR, PA-C 04/17/24 [phone]

## 2024-05-04 ENCOUNTER — Encounter: Payer: Self-pay | Admitting: Obstetrics and Gynecology

## 2024-05-04 ENCOUNTER — Ambulatory Visit (INDEPENDENT_AMBULATORY_CARE_PROVIDER_SITE_OTHER): Admitting: Obstetrics and Gynecology

## 2024-05-04 VITALS — BP 122/62 | HR 57 | Temp 97.8°F | Wt 118.0 lb

## 2024-05-04 DIAGNOSIS — N952 Postmenopausal atrophic vaginitis: Secondary | ICD-10-CM | POA: Diagnosis not present

## 2024-05-04 DIAGNOSIS — N958 Other specified menopausal and perimenopausal disorders: Secondary | ICD-10-CM

## 2024-05-04 MED ORDER — ESTRADIOL 0.01 % VA CREA: TOPICAL_CREAM | VAGINAL | 1 refills | Status: AC

## 2024-05-04 NOTE — Progress Notes (Signed)
 78 y.o. postmenopausal female here for problem visit. Widowed. Presents with her daughter.  No LMP recorded. Patient is postmenopausal.   She was evaluated by PCP 02/10/24 for dysuria and blood was noted in vaginal vault. 03/24/2024 TVUS had normal ET, no further workup needed for PMB.  Using aquaphor inside vagina for dryness and tenderness which seems to help less. No discharge or vaginal itching. No further bleeding or dark urine or stool. +UTI recently treated at urgent care Interested in starting PV estradiol  Last mammogram: 01/10/24 Density B; Bi-Rads 4 Suspicious Sexually active: No   OB History  Gravida Para Term Preterm AB Living  4 3 3  1 3   SAB IAB Ectopic Multiple Live Births  1    3    # Outcome Date GA Lbr Len/2nd Weight Sex Type Anes PTL Lv  4 SAB           3 Term      Vag-Spont   LIV  2 Term      Vag-Spont   LIV  1 Term      Vag-Spont   LIV   Past Medical History:  Diagnosis Date   Acid reflux    just occasionally   Arthritis    at the base of my spine   Gastritis    Hyperlipidemia    Hypertension    Pancreatitis    Past Surgical History:  Procedure Laterality Date   BREAST BIOPSY Left 2000   BREAST BIOPSY Left 01/15/2024   MM LT BREAST BX W LOC DEV 1ST LESION IMAGE BX SPEC STEREO GUIDE 01/15/2024 [facility]   CATARACT EXTRACTION, BILATERAL     fractured tibia repair     INTERCOSTAL NERVE BLOCK Right 09/28/2019   Procedure: Intercostal Nerve Block;  Surgeon: Shyrl Linnie KIDD, MD;  Location: [facility];  Service: Thoracic;  Laterality: Right;   NODE DISSECTION Right 09/28/2019   Procedure: Node Dissection;  Surgeon: Shyrl Linnie KIDD, MD;  Location: [facility];  Service: Thoracic;  Laterality: Right;   RHINOPLASTY     VIDEO BRONCHOSCOPY N/A 09/28/2019   Procedure: VIDEO BRONCHOSCOPY;  Surgeon: Shyrl Linnie KIDD, MD;  Location: [facility];  Service: Thoracic;  Laterality: N/A;   Current Outpatient Medications on File Prior to Visit  Medication Sig  Dispense Refill   Apoaequorin (PREVAGEN PO) Take 1 tablet by mouth daily.      Cholecalciferol (VITAMIN D) 50 MCG (2000 UT) tablet Take 1 tablet by mouth daily.     co-enzyme Q-10 50 MG capsule Take 50 mg by mouth daily.     Krill Oil (OMEGA-3) 500 MG CAPS Take by mouth.     metoprolol  succinate (TOPROL -XL) 25 MG 24 hr tablet Take 1 tablet by mouth once daily 90 tablet 2   metoprolol  tartrate (LOPRESSOR ) 100 MG tablet Take 1 tablet (100 mg total) by mouth once for 1 dose. Take 90-120 minutes prior to scan. Hold for SBP less than 110. 1 tablet 0   milk thistle 175 MG tablet Take 175 mg by mouth daily.      Multiple Vitamins-Minerals (HAIR SKIN AND NAILS FORMULA PO) Take 1 Capful by mouth daily at 2 PM.     pantoprazole  (PROTONIX ) 40 MG tablet Take 40 mg by mouth 2 (two) times daily.     rosuvastatin  (CRESTOR ) 20 MG tablet Take 1 tablet (20 mg total) by mouth at bedtime. 90 tablet 3   Saccharomyces boulardii (PROBIOTIC) 250 MG CAPS Take 1 capsule by mouth in  the morning and at bedtime. 60 capsule 0   valsartan  (DIOVAN ) 80 MG tablet Take 1 tablet (80 mg total) by mouth at bedtime. 90 tablet 3   vitamin E 180 MG (400 UNITS) capsule Take 400 Units by mouth daily.     neomycin-polymyxin b-dexamethasone  (MAXITROL) 3.5-10000-0.1 SUSP Place 1 drop into both eyes every 6 (six) hours. (Patient not taking: Reported on 05/04/2024)     No current facility-administered medications on file prior to visit.   Allergies  Allergen Reactions   Pravachol [Pravastatin] Other (See Comments)    Thought it affected her memory and thinking   Prednisone Other (See Comments)    Pt. Stated it made me crazy      PE Today's Vitals   05/04/24 0940  BP: 122/62  Pulse: (!) 57  Temp: 97.8 F (36.6 C)  TempSrc: Oral  SpO2: 98%  Weight: 118 lb (53.5 kg)   Body mass index is 20.9 kg/m.  Physical Exam Vitals reviewed.  Constitutional:      General: She is not in acute distress.    Appearance: Normal  appearance.  HENT:     Head: Normocephalic and atraumatic.     Nose: Nose normal.  Eyes:     Extraocular Movements: Extraocular movements intact.     Conjunctiva/sclera: Conjunctivae normal.  Pulmonary:     Effort: Pulmonary effort is normal.  Musculoskeletal:        General: Normal range of motion.     Cervical back: Normal range of motion.  Neurological:     General: No focal deficit present.     Mental Status: She is alert.  Psychiatric:        Mood and Affect: Mood normal.        Behavior: Behavior normal.     Assessment and Plan:        Genitourinary syndrome of menopause -     Estradiol; Apply 0.5g to vulva nightly for 2 weeks then 2 times a week.  Dispense: 42.5 g; Refill: 1  Reviewed safety profile of low dose vaginal estrogen, however reviewed that higher doses have been associated with DVT, breast and uterine cancer.  Reviewed has been shown to decreased recurrent UTI   Vera LULLA Pa, MD

## 2024-05-11 ENCOUNTER — Other Ambulatory Visit: Payer: Self-pay | Admitting: Cardiology

## 2024-05-11 DIAGNOSIS — I10 Essential (primary) hypertension: Secondary | ICD-10-CM

## 2024-05-12 DIAGNOSIS — I10 Essential (primary) hypertension: Secondary | ICD-10-CM | POA: Diagnosis not present

## 2024-05-12 DIAGNOSIS — M545 Low back pain, unspecified: Secondary | ICD-10-CM | POA: Diagnosis not present

## 2024-05-12 DIAGNOSIS — M4120 Other idiopathic scoliosis, site unspecified: Secondary | ICD-10-CM | POA: Diagnosis not present

## 2024-05-12 DIAGNOSIS — E785 Hyperlipidemia, unspecified: Secondary | ICD-10-CM | POA: Diagnosis not present

## 2024-05-22 ENCOUNTER — Encounter: Payer: Self-pay | Admitting: Cardiology

## 2024-05-25 NOTE — Telephone Encounter (Signed)
Agree with above recommendations.  Thanks MJP

## 2024-06-02 DIAGNOSIS — R5383 Other fatigue: Secondary | ICD-10-CM | POA: Diagnosis not present

## 2024-06-02 DIAGNOSIS — R059 Cough, unspecified: Secondary | ICD-10-CM | POA: Diagnosis not present

## 2024-06-02 DIAGNOSIS — R0981 Nasal congestion: Secondary | ICD-10-CM | POA: Diagnosis not present

## 2024-06-02 DIAGNOSIS — J069 Acute upper respiratory infection, unspecified: Secondary | ICD-10-CM | POA: Diagnosis not present

## 2024-06-02 DIAGNOSIS — J029 Acute pharyngitis, unspecified: Secondary | ICD-10-CM | POA: Diagnosis not present

## 2024-06-02 DIAGNOSIS — Z1152 Encounter for screening for COVID-19: Secondary | ICD-10-CM | POA: Diagnosis not present

## 2024-07-31 ENCOUNTER — Other Ambulatory Visit (HOSPITAL_COMMUNITY): Payer: Self-pay | Admitting: Internal Medicine

## 2024-07-31 NOTE — Progress Notes (Signed)
 Received Leqvio order. Patient is new start  Tina Erickson, PharmD, MPH, BCPS, CPP Clinical Pharmacist

## 2024-08-04 ENCOUNTER — Ambulatory Visit: Admitting: Obstetrics and Gynecology

## 2024-09-14 ENCOUNTER — Encounter
